# Patient Record
Sex: Female | Born: 1940 | Race: White | Hispanic: No | Marital: Married | Smoking: Never smoker
Health system: Southern US, Community
[De-identification: ages and names within clinical notes are randomized; demographics above are authoritative.]

## PROBLEM LIST (undated history)

## (undated) DIAGNOSIS — M5030 Other cervical disc degeneration, unspecified cervical region: Secondary | ICD-10-CM

## (undated) DIAGNOSIS — Z8639 Personal history of other endocrine, nutritional and metabolic disease: Secondary | ICD-10-CM

## (undated) DIAGNOSIS — IMO0001 Reserved for inherently not codable concepts without codable children: Secondary | ICD-10-CM

## (undated) DIAGNOSIS — M79669 Pain in unspecified lower leg: Secondary | ICD-10-CM

## (undated) DIAGNOSIS — E039 Hypothyroidism, unspecified: Secondary | ICD-10-CM

## (undated) DIAGNOSIS — N949 Unspecified condition associated with female genital organs and menstrual cycle: Secondary | ICD-10-CM

## (undated) DIAGNOSIS — J45909 Unspecified asthma, uncomplicated: Secondary | ICD-10-CM

## (undated) DIAGNOSIS — R112 Nausea with vomiting, unspecified: Secondary | ICD-10-CM

## (undated) DIAGNOSIS — Z8719 Personal history of other diseases of the digestive system: Secondary | ICD-10-CM

## (undated) DIAGNOSIS — D649 Anemia, unspecified: Secondary | ICD-10-CM

## (undated) DIAGNOSIS — M19032 Primary osteoarthritis, left wrist: Secondary | ICD-10-CM

## (undated) DIAGNOSIS — Z86010 Personal history of colonic polyps: Secondary | ICD-10-CM

## (undated) DIAGNOSIS — N159 Renal tubulo-interstitial disease, unspecified: Secondary | ICD-10-CM

## (undated) DIAGNOSIS — Z860101 Personal history of adenomatous and serrated colon polyps: Secondary | ICD-10-CM

## (undated) DIAGNOSIS — Z9889 Other specified postprocedural states: Secondary | ICD-10-CM

## (undated) DIAGNOSIS — E785 Hyperlipidemia, unspecified: Secondary | ICD-10-CM

## (undated) HISTORY — DX: Hyperlipidemia, unspecified: E78.5

## (undated) HISTORY — DX: Pain in unspecified lower leg: M79.669

## (undated) HISTORY — PX: TONSILLECTOMY AND ADENOIDECTOMY: SHX28

## (undated) HISTORY — PX: CHOLECYSTECTOMY: SHX55

## (undated) HISTORY — PX: BAND HEMORRHOIDECTOMY: SHX1213

## (undated) HISTORY — PX: OTHER SURGICAL HISTORY: SHX169

## (undated) HISTORY — PX: TUBAL LIGATION: SHX77

## (undated) HISTORY — PX: COLONOSCOPY W/ BIOPSIES AND POLYPECTOMY: SHX1376

## (undated) HISTORY — DX: Unspecified condition associated with female genital organs and menstrual cycle: N94.9

## (undated) HISTORY — DX: Primary osteoarthritis, left wrist: M19.032

## (undated) HISTORY — DX: Personal history of colonic polyps: Z86.010

## (undated) HISTORY — DX: Other cervical disc degeneration, unspecified cervical region: M50.30

## (undated) HISTORY — DX: Personal history of other endocrine, nutritional and metabolic disease: Z86.39

## (undated) HISTORY — DX: Hypothyroidism, unspecified: E03.9

## (undated) HISTORY — DX: Personal history of adenomatous and serrated colon polyps: Z86.0101

## (undated) HISTORY — DX: Personal history of other diseases of the digestive system: Z87.19

---

## 1970-10-29 HISTORY — PX: THYROIDECTOMY, PARTIAL: SHX18

## 1998-03-18 ENCOUNTER — Other Ambulatory Visit: Admission: RE | Admit: 1998-03-18 | Discharge: 1998-03-18 | Payer: Self-pay | Admitting: *Deleted

## 1999-03-23 ENCOUNTER — Other Ambulatory Visit: Admission: RE | Admit: 1999-03-23 | Discharge: 1999-03-23 | Payer: Self-pay | Admitting: *Deleted

## 2000-03-13 ENCOUNTER — Ambulatory Visit: Admission: RE | Admit: 2000-03-13 | Discharge: 2000-03-13 | Payer: Self-pay | Admitting: Family Medicine

## 2000-03-29 ENCOUNTER — Encounter: Payer: Self-pay | Admitting: Family Medicine

## 2000-03-29 ENCOUNTER — Ambulatory Visit (HOSPITAL_COMMUNITY): Admission: RE | Admit: 2000-03-29 | Discharge: 2000-03-29 | Payer: Self-pay | Admitting: Family Medicine

## 2000-04-16 ENCOUNTER — Other Ambulatory Visit: Admission: RE | Admit: 2000-04-16 | Discharge: 2000-04-16 | Payer: Self-pay | Admitting: *Deleted

## 2001-06-05 ENCOUNTER — Other Ambulatory Visit: Admission: RE | Admit: 2001-06-05 | Discharge: 2001-06-05 | Payer: Self-pay | Admitting: *Deleted

## 2001-10-29 HISTORY — PX: GANGLION CYST EXCISION: SHX1691

## 2002-04-22 ENCOUNTER — Ambulatory Visit (HOSPITAL_BASED_OUTPATIENT_CLINIC_OR_DEPARTMENT_OTHER): Admission: RE | Admit: 2002-04-22 | Discharge: 2002-04-22 | Payer: Self-pay | Admitting: Orthopedic Surgery

## 2002-10-07 ENCOUNTER — Other Ambulatory Visit: Admission: RE | Admit: 2002-10-07 | Discharge: 2002-10-07 | Payer: Self-pay | Admitting: Obstetrics and Gynecology

## 2003-10-11 ENCOUNTER — Other Ambulatory Visit: Admission: RE | Admit: 2003-10-11 | Discharge: 2003-10-11 | Payer: Self-pay | Admitting: *Deleted

## 2004-10-16 ENCOUNTER — Other Ambulatory Visit: Admission: RE | Admit: 2004-10-16 | Discharge: 2004-10-16 | Payer: Self-pay | Admitting: Obstetrics and Gynecology

## 2004-10-29 HISTORY — PX: WRIST SURGERY: SHX841

## 2005-04-25 ENCOUNTER — Ambulatory Visit (HOSPITAL_COMMUNITY): Admission: RE | Admit: 2005-04-25 | Discharge: 2005-04-25 | Payer: Self-pay | Admitting: Orthopedic Surgery

## 2005-04-25 ENCOUNTER — Ambulatory Visit (HOSPITAL_BASED_OUTPATIENT_CLINIC_OR_DEPARTMENT_OTHER): Admission: RE | Admit: 2005-04-25 | Discharge: 2005-04-25 | Payer: Self-pay | Admitting: Orthopedic Surgery

## 2005-10-25 ENCOUNTER — Other Ambulatory Visit: Admission: RE | Admit: 2005-10-25 | Discharge: 2005-10-25 | Payer: Self-pay | Admitting: Obstetrics and Gynecology

## 2008-02-27 ENCOUNTER — Ambulatory Visit: Payer: Self-pay | Admitting: Vascular Surgery

## 2009-10-29 DIAGNOSIS — Z8639 Personal history of other endocrine, nutritional and metabolic disease: Secondary | ICD-10-CM

## 2009-10-29 HISTORY — DX: Personal history of other endocrine, nutritional and metabolic disease: Z86.39

## 2010-11-19 ENCOUNTER — Encounter: Payer: Self-pay | Admitting: Family Medicine

## 2011-03-13 NOTE — Consult Note (Signed)
NEW PATIENT CONSULTATION   JI, FAIRBURN D  DOB:  1941-07-23                                       02/27/2008  ZOXWR#:[id number]   Margaret Holmes presents today for evaluation of pain in her left calf.  She is a 70 year old white female who has had an extensive evaluation  for this with no clear etiology.  She had seen the [facility]  specialist in January on two occasions.  I do have an ultrasound from  that visit showing normal left great saphenous and left small saphenous  vein with no evidence of reflux.  She also had no evidence of deep  venous incompetence.  She has also undergone an MRI, which I do not have  a copy of, which apparently showed some cellulitis in this area.  She  does report that her ankle will give out occasionally on the left side  as well.   PAST MEDICAL HISTORY:  Is negative for diabetes, high blood pressure,  cardiac disease.  She is status post partial thyroidectomy in 1972, she  also has had cholecystectomy in the 1980s.   SOCIAL HISTORY:  She is married with 3 children, works as a Midwife,  she does not smoke, or drink alcohol.   REVIEW OF SYSTEMS:  Positive for hiatal hernia, leg pain, headaches, her  weight is reported 124 pounds, height at 5 feet 4 inches tall.  She has  no drug allergies and her only medication is Synthroid.   PHYSICAL EXAM:  She does have 2+ dorsalis pedis pulses bilaterally, she  does not have any significant leg swelling, she does have multiple  spider vein telangiectasia but no evidence of varicose veins.  She does  have tenderness and some thickening of the skin in the area of concern  over her posterior calf from her ankle up onto her Achilles tendon area.  I imaged this area with handheld duplex and do not see any evidence of  any varicosities around this area.  I again discussed this with Mrs.  Holmes.  She does not have any evidence of arterial or venous pathology  to cause this pain, she is  frustrated at not knowing the cause of this.  She will continue to follow up with Dr. Foy Guadalajara and will see [place] again on  an as-needed basis.   Larina Earthly, M.D.  Electronically Signed   TFE/MEDQ  D:  02/27/2008  T:  03/01/2008  Job:  [id number]   cc:   Molly Maduro L. Foy Guadalajara, M.D.

## 2011-03-16 NOTE — Op Note (Signed)
[place]  Patient:    Margaret Holmes, Margaret Holmes Visit Number: [id number] MRN: [id number]          Service Type: DSU Location: [facility] Attending Physician:  Milly Jakob Dictated by:   Harvie Junior, M.D. Proc. Date: 04/22/02 Admit Date:  04/22/2002   CC:         Marinda Elk, M.D.   Operative Report  PREOPERATIVE DIAGNOSIS:  Ganglion cyst, right.  POSTOPERATIVE DIAGNOSES: 1. Ganglion cyst, right. 2. Pisotriquetral arthritic change.  PROCEDURES: 1. Excision of ganglion cyst. 2. Exploration of flexor carpi ulnaris tendon. 3. Debridement of spurs from the trapezium.  SURGEON: Harvie Junior, M.D.  ASSISTANT:  Currie Paris. Thedore Mins.  ANESTHESIA:  General.  BRIEF HISTORY:  She is a 70 year old female with a long history of having a painful ganglion cyst in the ulnar aspect of her wrist.  We attempted aspiration, attempted anti-inflammatory medicine, rest; all this failed, and so the patient continued to have pain over the ulnar aspect of the wrist with a large ganglion cyst.  Because of continued complaints of pain, she was ultimately taken to the operating room for excision of cyst.  DESCRIPTION OF PROCEDURE:  The patient was taken to the operating room and after adequate anesthesia was obtained with an IV regional, the patient was placed supine upon the operating table.  The right arm was then prepped and draped in the usual sterile fashion.  Once this was undertaken, the curved incision was made ,the subcutaneous tissues taken down to the level of the ganglion cyst.  The ganglion was identified and clearly dissected free and excised from the ulnar wrist capsule.  The FCU tendon was then explored and noted to be within normal limits.  Interestingly, as we got down to the pisiform bone, there was some pisotriquetral arthritic change.  There was a large spur which was noted on this ulnar aspect in the area of the cyst.  This was removed with  a rongeur and the area was smoothed down.  At this point a Glorious Peach was used to be placed into the pisotriquetral joint.  It appeared to be relatively smooth.  At this point felt that pisiform excision was not appropriate.  At this point attention was turned back to the FCU tendon, which was explored, noted to be within normal limits.  At this point the wrist was copiously irrigated and suctioned dry.  The wound was closed with a running subcuticular suture.  Benzoin and Steri-Strips were applied.  A sterile compressive dressing was applied.  The patient was taken to the recovery room, where she was noted to be in stable condition.  Estimated blood loss for the procedure was none. Dictated by:   Harvie Junior, M.D. Attending Physician:  Milly Jakob DD:  04/22/02 TD:  04/23/02 Job: [id number]

## 2011-03-16 NOTE — Op Note (Signed)
Margaret Holmes, Margaret Holmes               ACCOUNT NO.:  [id number]   MEDICAL RECORD NO.:  [id number]          PATIENT TYPE:  AMB   LOCATION:  DSC                          FACILITY:  [facility]   PHYSICIAN:  Artist Pais. Weingold, M.D.DATE OF BIRTH:  Jan 24, 1941   DATE OF PROCEDURE:  04/25/2005  DATE OF DISCHARGE:  04/25/2005                                 OPERATIVE REPORT   PREOPERATIVE DIAGNOSIS:  Left thumb carpal metacarpal arthritis.   POSTOPERATIVE DIAGNOSIS:  Left thumb carpal metacarpal arthritis.   PROCEDURE:  Left thumb carpal metacarpal suspension plasty with abductor  pollicis longus tendon transfer.   SURGEON:  Artist Pais. Mina Marble, M.D.   ASSISTANT:  Aura Fey. Bobbe Medico.   ANESTHESIA:  General.   TOURNIQUET TIME:  50 minutes.   COMPLICATIONS:  None.   DRAINS:  None.   DESCRIPTION OF PROCEDURE:  The patient was taken to the operating room and  after the induction of adequate general anesthesia, the left upper extremity  was prepped in the usual sterile fashion.  An Esmarch was used to  exsanguinate the limbs.  The tourniquet was inflated to 250 mmHg.  At this  point in time, a J-shaped incision was made over the thenar eminence of the  left thumb.  The skin was incised.  A volarly based flap was elevated.  The  thenar muscles were elevated off the CMC capsule.  The CMC capsule was  incised.  At this point in time, a trapeziectomy using a combination of  curets, osteotomes and rongeurs.  After this was done, a [facility] synovectomy was  performed.  This was followed by created of a transosseous canal at the base  of the thumb starting at the middle of the articular surface and exiting  dorsally 2 cm from the joint surface.  This was done using hand drills and  curets.  At this point in time, a second incision was made over the  musculotendinous junction of the first dorsal compartment.  The APL tendon  was identified at the musculotendinous junction, transected and drawn into  the  distal most wound.  It was then carried from dorsal volar out to thumb  metacarpal, wrapped around the flexor carpi radialis, and ______  suture  with 2-0 FiberWire and sutured back to itself at the base of the metacarpal,  thus creating  the suspension.  The wound was then thoroughly irrigated and thenar muscles  were repaired with 4-0 Vicryl, and both skin incisions were closed with 3-0  Prolene subcuticular stitches.  Steri-Strips, 4 x 4, Fluff and a compressive  dressing was applied.  The patient tolerated the procedure well and left the  operating room in stable fashion.       MAW/MEDQ  D:  04/27/2005  T:  04/27/2005  Job:  [id number]

## 2012-05-09 ENCOUNTER — Ambulatory Visit (INDEPENDENT_AMBULATORY_CARE_PROVIDER_SITE_OTHER): Payer: BC Managed Care – PPO | Admitting: Family Medicine

## 2012-05-09 ENCOUNTER — Encounter: Payer: Self-pay | Admitting: Family Medicine

## 2012-05-09 VITALS — BP 139/79 | HR 62 | Wt 123.0 lb

## 2012-05-09 DIAGNOSIS — E785 Hyperlipidemia, unspecified: Secondary | ICD-10-CM

## 2012-05-09 DIAGNOSIS — J329 Chronic sinusitis, unspecified: Secondary | ICD-10-CM

## 2012-05-09 DIAGNOSIS — Z0000 Encounter for general adult medical examination without abnormal findings: Secondary | ICD-10-CM

## 2012-05-09 DIAGNOSIS — Z86010 Personal history of colonic polyps: Secondary | ICD-10-CM

## 2012-05-09 DIAGNOSIS — Z23 Encounter for immunization: Secondary | ICD-10-CM

## 2012-05-09 DIAGNOSIS — E039 Hypothyroidism, unspecified: Secondary | ICD-10-CM

## 2012-05-09 LAB — TSH: TSH: 2.34 u[IU]/mL (ref 0.35–5.50)

## 2012-05-09 MED ORDER — ZOSTER VACCINE LIVE 19400 UNT/0.65ML ~~LOC~~ SOLR: 0.6500 mL | Freq: Once | SUBCUTANEOUS | Status: AC

## 2012-05-09 MED ORDER — FLUTICASONE PROPIONATE 50 MCG/ACT NA SUSP: NASAL | Status: DC

## 2012-05-09 MED ORDER — AMOXICILLIN 875 MG PO TABS: ORAL_TABLET | ORAL | Status: DC

## 2012-05-09 NOTE — Progress Notes (Addendum)
Office Note 05/11/2012  CC:  Chief Complaint  Patient presents with  . Establish Care    check TSH, sinus problems    HPI:  Margaret Holmes is a 71 y.o. White female who is here to establish care. Patient's most recent primary MD: Brooks Sailors in [place]. Old records were not reviewed prior to or during today's visit.  Pt presents complaining of respiratory symptoms for months days.  Primary symptoms are:  Nasal congestion, sometimes runny nose, facial/perinasal pressure, PND, some HA lately.  Worst symptoms seems to be the sinus pressure.  Lately the symptoms seem to be staying the same. Pertinent negatives: No fevers, no wheezing, and no SOB.  No pain in face or teeth.  No significant HA.  ST mild at most.   Symptoms made worse by the heat, night-time.  Symptoms improved by nothing. Smoker? no Recent sick contact? no Muscle or joint aches? no  Additional ROS: no n/v/d or abdominal pain.  No rash.  No neck stiffness.   No fatigue or appetite loss.  Past Medical History  Diagnosis Date  . Hyperlipidemia     per pt report 05/09/12--awaiting old records  . Hypothyroidism   . History of adenomatous polyp of colon 2004    due for repeat    Past Surgical History  Procedure Date  . Cholecystectomy 1980s  . Thyroidectomy, partial   . Ganglion cyst excision 2003    Right wrist, with excision of some triquetrum spurs  . Wrist surgery 2006    For left thumb carpo-metacarpal arthritis  . Tonsillectomy and adenoidectomy age 28    Family History  Problem Relation Age of Onset  . Cancer Mother     ovarian and throat cancer  . Diabetes Mother   . Alcohol abuse Father     alcoholism.  Died age 71  . Cancer Maternal Grandmother     breast cancer    History   Social History  . Marital Status: Married    Spouse Name: N/A    Number of Children: N/A  . Years of Education: N/A   Occupational History  . Not on file.   Social History Main Topics  . Smoking status: Never  Smoker   . Smokeless tobacco: Never Used  . Alcohol Use: No  . Drug Use: No  . Sexually Active: Not on file   Other Topics Concern  . Not on file   Social History Narrative   Married, 3 children, 6 grandchildren, 1 GGc.Orig from this region.  Drives a school bus (X 31 yrs).No T/A/Ds.Caffeine: one cup coffe a day, 3 glasses of sweet tea per day.No formal exercise.  She is active (NOT sedentary).    MEDS: Synthroid 75mg  qd, MVI qd, tylenol 500mg  prn  No Known Allergies  ROS Review of Systems  Constitutional: Negative for fever and fatigue.  HENT: Negative for congestion and sore throat.   Eyes: Negative for visual disturbance.  Respiratory: Negative for cough.   Cardiovascular: Negative for chest pain.  Gastrointestinal: Negative for nausea and abdominal pain.  Genitourinary: Negative for dysuria.  Musculoskeletal: Negative for back pain and joint swelling.  Skin: Negative for rash.  Neurological: Negative for weakness and headaches.  Hematological: Negative for adenopathy.  Psychiatric/Behavioral: Negative for dysphoric mood.    PE; Blood pressure 139/79, pulse 62, weight 123 lb (55.792 kg), SpO2 98.00%. Gen: Alert, well appearing.  Patient is oriented to person, place, time, and situation. ENT: Ears: EACs clear, normal epithelium.  TMs  with good light reflex and landmarks bilaterally.  Eyes: no injection, icteris, swelling, or exudate.  EOMI, PERRLA. Nose: no drainage but mild turbinate edema/swelling. Minimal mucosal injection, no focal lesion.  Mouth: lips without lesion/swelling.  Oral mucosa pink and moist.  Dentition intact and without obvious caries or gingival swelling.  Oropharynx without erythema, exudate, or swelling.   Pertinent labs:  none  ASSESSMENT AND PLAN:   New Pt: obtain old records.  History of adenomatous polyp of colon She is overdue for repeat colonoscopy. We've contacted Dr. Jennye Boroughs office and they will get in touch with her to set up repeat  colonoscopy.  Rhinosinusitis She looks pretty good today but given the chronicity of her symptoms we do need to get her on some allergy meds. We'll start with flonase every morning.  I did rx amoxicillin 875mg  bid x 10d.  Preventative health care Zostavax rx given today to take to pharmacy and get administered there.    Return in about 6 months (around 11/09/2012) for f/u hypothyroidism.

## 2012-05-11 ENCOUNTER — Encounter: Payer: Self-pay | Admitting: Family Medicine

## 2012-05-11 DIAGNOSIS — E039 Hypothyroidism, unspecified: Secondary | ICD-10-CM | POA: Insufficient documentation

## 2012-05-11 NOTE — Assessment & Plan Note (Signed)
Zostavax rx given today to take to pharmacy and get administered there.

## 2012-05-11 NOTE — Assessment & Plan Note (Signed)
She is overdue for repeat colonoscopy. We've contacted Dr. Jennye Boroughs office and they will get in touch with her to set up repeat colonoscopy.

## 2012-05-11 NOTE — Assessment & Plan Note (Addendum)
She looks pretty good today but given the chronicity of her symptoms we do need to get her on some allergy meds. We'll start with flonase every morning.  I did rx amoxicillin 875mg  bid x 10d.

## 2012-05-14 ENCOUNTER — Other Ambulatory Visit (INDEPENDENT_AMBULATORY_CARE_PROVIDER_SITE_OTHER): Payer: BC Managed Care – PPO

## 2012-05-14 DIAGNOSIS — E785 Hyperlipidemia, unspecified: Secondary | ICD-10-CM

## 2012-05-14 LAB — COMPREHENSIVE METABOLIC PANEL
AST: 27 U/L (ref 0–37)
Albumin: 4.2 g/dL (ref 3.5–5.2)
BUN: 15 mg/dL (ref 6–23)
Calcium: 9.5 mg/dL (ref 8.4–10.5)
Chloride: 102 mEq/L (ref 96–112)
Creatinine, Ser: 0.8 mg/dL (ref 0.4–1.2)
GFR: 79.1 mL/min (ref >60.00)
Glucose, Bld: 100 mg/dL — ABNORMAL HIGH (ref 70–99)
Potassium: 4.5 mEq/L (ref 3.5–5.1)

## 2012-05-14 LAB — LIPID PANEL
Cholesterol: 219 mg/dL — ABNORMAL HIGH (ref 0–200)
VLDL: 18.6 mg/dL (ref 0.0–40.0)

## 2012-05-14 LAB — LDL CHOLESTEROL, DIRECT: Direct LDL: 143.5 mg/dL

## 2012-05-28 ENCOUNTER — Encounter: Payer: Self-pay | Admitting: Family Medicine

## 2012-05-29 ENCOUNTER — Other Ambulatory Visit: Payer: BC Managed Care – PPO

## 2012-05-29 DIAGNOSIS — Z01419 Encounter for gynecological examination (general) (routine) without abnormal findings: Secondary | ICD-10-CM

## 2012-05-30 ENCOUNTER — Telehealth: Payer: Self-pay | Admitting: Family Medicine

## 2012-05-30 LAB — VITAMIN D 25 HYDROXY (VIT D DEFICIENCY, FRACTURES): Vit D, 25-Hydroxy: 33 ng/mL (ref 30–89)

## 2012-05-30 NOTE — Telephone Encounter (Signed)
Pt notified and is agreeable with plan. 

## 2012-05-30 NOTE — Telephone Encounter (Signed)
I got a faxed result for a Vit D level ordered by Julio Sicks (?).  Please notify the pt that her Vit D level is in the low normal range (33, and lower limit of normal is 30).  I recommend she get Vit D OTC pills that are 1000 Units each, take one daily and try to get more daily sunlight.--thx

## 2012-06-05 ENCOUNTER — Other Ambulatory Visit: Payer: Self-pay | Admitting: *Deleted

## 2012-06-05 MED ORDER — LEVOTHYROXINE SODIUM 75 MCG PO TABS: 75.0000 ug | ORAL_TABLET | Freq: Every day | ORAL | Status: DC

## 2012-06-05 NOTE — Telephone Encounter (Signed)
Refill request for synthroid Last filled-by previous physician Last labs normal and continue dose. RX sent.

## 2012-07-03 ENCOUNTER — Encounter: Payer: Self-pay | Admitting: Family Medicine

## 2012-10-27 ENCOUNTER — Telehealth: Payer: Self-pay | Admitting: Family Medicine

## 2012-10-30 NOTE — Telephone Encounter (Signed)
Advised pt she does not have to be fasting and we could check labs at her visit.  She is agreeable.

## 2012-10-31 ENCOUNTER — Ambulatory Visit (INDEPENDENT_AMBULATORY_CARE_PROVIDER_SITE_OTHER): Payer: BC Managed Care – PPO | Admitting: Family Medicine

## 2012-10-31 ENCOUNTER — Encounter: Payer: Self-pay | Admitting: Family Medicine

## 2012-10-31 VITALS — BP 133/69 | HR 69 | Wt 119.0 lb

## 2012-10-31 DIAGNOSIS — M6740 Ganglion, unspecified site: Secondary | ICD-10-CM

## 2012-10-31 DIAGNOSIS — M67439 Ganglion, unspecified wrist: Secondary | ICD-10-CM

## 2012-10-31 DIAGNOSIS — M62838 Other muscle spasm: Secondary | ICD-10-CM

## 2012-10-31 DIAGNOSIS — Z8639 Personal history of other endocrine, nutritional and metabolic disease: Secondary | ICD-10-CM

## 2012-10-31 DIAGNOSIS — IMO0001 Reserved for inherently not codable concepts without codable children: Secondary | ICD-10-CM

## 2012-10-31 DIAGNOSIS — M791 Myalgia, unspecified site: Secondary | ICD-10-CM

## 2012-10-31 DIAGNOSIS — E039 Hypothyroidism, unspecified: Secondary | ICD-10-CM

## 2012-10-31 LAB — BASIC METABOLIC PANEL
Chloride: 105 mEq/L (ref 96–112)
GFR: 80.19 mL/min (ref >60.00)
Potassium: 3.7 mEq/L (ref 3.5–5.1)
Sodium: 140 mEq/L (ref 135–145)

## 2012-10-31 LAB — MAGNESIUM: Magnesium: 2 mg/dL (ref 1.5–2.5)

## 2012-10-31 LAB — PHOSPHORUS: Phosphorus: 2.9 mg/dL (ref 2.3–4.6)

## 2012-10-31 LAB — TSH: TSH: 0.65 u[IU]/mL (ref 0.35–5.50)

## 2012-10-31 NOTE — Patient Instructions (Addendum)
Apply gold bond moisturizing cream at least twice daily. Apply Aquafor (over the counter ointment) at least twice per day.

## 2012-10-31 NOTE — Progress Notes (Signed)
OFFICE NOTE  10/31/2012  CC:  Chief Complaint  Patient presents with  . Follow-up    Hypothyroid; also having cramps in hands, legs, feet; lump on right wrist     HPI: Patient is a 72 y.o. Caucasian female who is here for 5mo f/u hypothyroidism.   Reports compliance with thyroid med daily. Noted a firm bump come up on right wrist about a year ago.  It bothers her intermittently, sometimes enough to inhibit her wrist bending due to the pain.  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 Describes daily cramping/spasming in muscles of hands and lower legs.  Asks for testing of potassium and vitamin D.  She recalls having more of these cramps when she was dx'd with vit D def in the past--? Coincidence.  Pertinent PMH:  Past Medical History  Diagnosis Date  . Hyperlipidemia     per pt report 05/09/12--awaiting old records  . Hypothyroidism   . History of adenomatous polyp of colon 2003, 2006, 2013    Repeat 2013 showed tubular adenoma x 3, with no high grade dysplasia.  3 year recall.  . H/O angular cheilitis     Zinc normal, vit B12 low normal (2012)  . H/O vitamin D deficiency 2011    Came back to normal range with replacement therapydd  . DDD (degenerative disc disease), cervical   . Mechanical back pain     MEDS:  Outpatient Prescriptions Prior to Visit  Medication Sig Dispense Refill  . Acetaminophen 500 MG TBDP Take 1 tablet by mouth as needed.      Marland Kitchen levothyroxine (SYNTHROID, LEVOTHROID) 75 MCG tablet Take 1 tablet (75 mcg total) by mouth daily.  90 tablet  1  . [DISCONTINUED] amoxicillin (AMOXIL) 875 MG tablet 1 tab po bid x 10d  20 tablet  0  . [DISCONTINUED] fluticasone (FLONASE) 50 MCG/ACT nasal spray 2 sprays in each nostril every morning  16 g  6  . [DISCONTINUED] Multiple Vitamin (MULTIVITAMIN) tablet Take 1 tablet by mouth daily.       Last reviewed on 10/31/2012  2:42 PM by Jeoffrey Massed, MD  PE: Blood pressure 133/69, pulse 69, weight 119 lb (53.978 kg). Gen: Alert,  well appearing.  Patient is oriented to person, place, time, and situation. Neck: supple/nontender.  No LAD, mass, or TM.  Carotid pulses 2+ bilaterally, without bruits. CV: RRR, no m/r/g.   LUNGS: CTA bilat, nonlabored resps, good aeration in all lung fields. Right wrist with firm but barely compressible nodule on volar aspect, lateral portion--approx 2 cm diameter.  Nontender. LE's: no edema, but extensive spider veins and varicose veins on both LL's below the knees.   IMPRESSION AND PLAN:  Hypothyroidism Check TSH today, adjust synthroid as appropriate.  Ganglion cyst of wrist Right.  Moderately symptomatic. Offered referral to gen surg for consideration of excision but she wants to wait on this for now.  Muscle spasms of lower extremity Also both hands.  Mostly nocturnal. Reassured pt that these are likely benign. Will check BMET with Mag and Phos.  H/O vitamin D deficiency This was treated in the past and documented to have come back in normal range.  However, she is requesting f/u testing of this. At next f/u, will recommend pt get bone densitometry testing to screen for osteoporosis.   An After Visit Summary was printed and given to the patient.  FOLLOW UP: 5mo

## 2012-11-01 LAB — VITAMIN D 25 HYDROXY (VIT D DEFICIENCY, FRACTURES): Vit D, 25-Hydroxy: 36 ng/mL (ref 30–89)

## 2012-11-03 DIAGNOSIS — M62838 Other muscle spasm: Secondary | ICD-10-CM | POA: Insufficient documentation

## 2012-11-03 DIAGNOSIS — Z8639 Personal history of other endocrine, nutritional and metabolic disease: Secondary | ICD-10-CM | POA: Insufficient documentation

## 2012-11-03 DIAGNOSIS — M67439 Ganglion, unspecified wrist: Secondary | ICD-10-CM | POA: Insufficient documentation

## 2012-11-03 NOTE — Assessment & Plan Note (Signed)
Check TSH today, adjust synthroid as appropriate.

## 2012-11-03 NOTE — Assessment & Plan Note (Addendum)
This was treated in the past and documented to have come back in normal range.  However, she is requesting f/u testing of this. At next f/u, will recommend pt get bone densitometry testing to screen for osteoporosis.

## 2012-11-03 NOTE — Assessment & Plan Note (Signed)
Right.  Moderately symptomatic. Offered referral to gen surg for consideration of excision but she wants to wait on this for now.

## 2012-11-03 NOTE — Assessment & Plan Note (Signed)
Also both hands.  Mostly nocturnal. Reassured pt that these are likely benign. Will check BMET with Mag and Phos.

## 2012-12-13 ENCOUNTER — Other Ambulatory Visit: Payer: Self-pay | Admitting: Family Medicine

## 2012-12-15 NOTE — Telephone Encounter (Signed)
eScribe request for refill on LEVOTHYROXINE Last filled - 06/05/12, #90 X 1 Last seen on - 10/31/12, TSH also Follow up - 6 MONTHS RX sent per protocol

## 2013-04-30 ENCOUNTER — Encounter: Payer: Self-pay | Admitting: Family Medicine

## 2013-04-30 ENCOUNTER — Ambulatory Visit (INDEPENDENT_AMBULATORY_CARE_PROVIDER_SITE_OTHER): Payer: BC Managed Care – PPO | Admitting: Family Medicine

## 2013-04-30 VITALS — BP 125/74 | HR 72 | Temp 98.2°F | Ht 62.0 in | Wt 116.0 lb

## 2013-04-30 DIAGNOSIS — M6740 Ganglion, unspecified site: Secondary | ICD-10-CM

## 2013-04-30 DIAGNOSIS — E039 Hypothyroidism, unspecified: Secondary | ICD-10-CM

## 2013-04-30 DIAGNOSIS — M62838 Other muscle spasm: Secondary | ICD-10-CM

## 2013-04-30 DIAGNOSIS — M67431 Ganglion, right wrist: Secondary | ICD-10-CM

## 2013-04-30 NOTE — Progress Notes (Signed)
OFFICE NOTE  04/30/2013  CC:  Chief Complaint  Patient presents with  . Follow-up    6 month     HPI: Patient is a 72 y.o. Caucasian female who is here for 6 mo f/u hypothyroidism. Takes thyroid med daily.  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 No new questions.   She will be going to her GYN in August for exam/mammogram.  She reports that a bone density test through their office in the past was normal.  The plan is to repeat this in the next 2-3 years-GYN to order per pt. She still has the zostavax rx I gave her last visit and plans on going to pharmacy to get this vaccine.  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  Pertinent PMH:  Past Medical History  Diagnosis Date  . Hyperlipidemia     per pt report 05/09/12--awaiting old records  . Hypothyroidism   . History of adenomatous polyp of colon 2003, 2006, 2013    Repeat 2013 showed tubular adenoma x 3, with no high grade dysplasia.  3 year recall.  . H/O angular cheilitis     Zinc normal, vit B12 low normal (2012)  . H/O vitamin D deficiency 2011    Came back to normal range with replacement therapydd  . DDD (degenerative disc disease), cervical   . Mechanical back pain    Past Surgical History  Procedure Laterality Date  . Cholecystectomy  1980s  . Thyroidectomy, partial  1972  . Ganglion cyst excision  2003    Right wrist, with excision of some triquetrum spurs  . Wrist surgery  2006    For left thumb carpo-metacarpal arthritis  . Tonsillectomy and adenoidectomy  age 45  . Gastric banding port revision    . Band hemorrhoidectomy    . Colonoscopy w/ biopsies and polypectomy  06/02/12   Past family and social history reviewed and there are no changes since the patient's last office visit with me.  MEDS:  Outpatient Prescriptions Prior to Visit  Medication Sig  Dispense Refill  . Acetaminophen 500 MG TBDP Take 1 tablet by mouth as needed.      . cholecalciferol (VITAMIN D) 1000 UNITS tablet Take 1,000 Units by mouth daily.      Marland Kitchen levothyroxine (SYNTHROID, LEVOTHROID) 75 MCG tablet TAKE 1 TABLET DAILY  90 tablet  1   No facility-administered medications prior to visit.    PE: Blood pressure 125/74, pulse 72, temperature 98.2 F (36.8 C), temperature source Oral, height 5\' 2"  (1.575 m), weight 116 lb (52.617 kg), SpO2 95.00%. Gen: Alert, well appearing.  Patient is oriented to person, place, time, and situation. CV: RRR, no m/r/g.   LUNGS: CTA bilat, nonlabored resps, good aeration in all lung fields. EXT: no clubbing, cyanosis, or edema.  No calf tenderness.  Popliteal fossae w/out tenderness or mass.  Knees w/out swelling. Right wrist volar surface with approx 1-2 cm moveable oval subQ nodule that moves with the flexor tendons of wrist/fingers.    LAB: none today  IMPRESSION AND PLAN:  Hypothyroidism Lab Results  Component Value Date   TSH 0.65 10/31/2012   Continue current levothyroxine dosing. No labs today. Will recheck TSH at next f/u visit in 40mo (CPE).  Ganglion cyst of wrist Unchanged. Hurts her sometimes but she declines referral for surgical consult  at this time.  Muscle spasms of lower extremity Patient consistently describes calf muscle cramps. Reassured pt of benign nature of this condition, no specific meds to treat this. Encouraged good hydration.   An After Visit Summary was printed and given to the patient.  FOLLOW UP: 6 mo for fasting CPE

## 2013-05-13 ENCOUNTER — Telehealth: Payer: Self-pay | Admitting: Emergency Medicine

## 2013-05-13 NOTE — Telephone Encounter (Signed)
Patient states she received a letter from her insurance company to have a PAD screening done. She is concerned that some of her current leg pain could be related to some of the symptoms listed on the letter. Please advise patient I did offer to make appointment but she would like to speak with a doctor or nurse.

## 2013-05-14 ENCOUNTER — Other Ambulatory Visit: Payer: Self-pay | Admitting: Family Medicine

## 2013-05-14 DIAGNOSIS — M79669 Pain in unspecified lower leg: Secondary | ICD-10-CM

## 2013-05-14 NOTE — Assessment & Plan Note (Signed)
Patient consistently describes calf muscle cramps. Reassured pt of benign nature of this condition, no specific meds to treat this. Encouraged good hydration.

## 2013-05-14 NOTE — Assessment & Plan Note (Signed)
Unchanged. Hurts her sometimes but she declines referral for surgical consult at this time.

## 2013-05-14 NOTE — Telephone Encounter (Signed)
Please advise 

## 2013-05-14 NOTE — Telephone Encounter (Signed)
OK.  Tell pt I'll put in an order for a test called an ankle/brachial index or "ABI".  If this is normal, no further testing needed.  If it is abnormal then we'll proceed with more detailed testing to look for peripheral arterial disease (PAD). Let me know and I'll enter order.-thx

## 2013-05-14 NOTE — Telephone Encounter (Signed)
OK, ordered

## 2013-05-14 NOTE — Telephone Encounter (Signed)
Patient agreed to have ABI testing done.

## 2013-05-14 NOTE — Assessment & Plan Note (Signed)
Lab Results  Component Value Date   TSH 0.65 10/31/2012   Continue current levothyroxine dosing. No labs today. Will recheck TSH at next f/u visit in 64mo (CPE).

## 2013-05-18 ENCOUNTER — Encounter (INDEPENDENT_AMBULATORY_CARE_PROVIDER_SITE_OTHER): Payer: BC Managed Care – PPO

## 2013-05-18 ENCOUNTER — Encounter: Payer: Self-pay | Admitting: Family Medicine

## 2013-05-18 DIAGNOSIS — R252 Cramp and spasm: Secondary | ICD-10-CM

## 2013-05-18 DIAGNOSIS — I739 Peripheral vascular disease, unspecified: Secondary | ICD-10-CM

## 2013-05-20 ENCOUNTER — Encounter: Payer: Self-pay | Admitting: Family Medicine

## 2013-06-30 ENCOUNTER — Telehealth: Payer: Self-pay | Admitting: Family Medicine

## 2013-06-30 MED ORDER — LEVOTHYROXINE SODIUM 75 MCG PO TABS: ORAL_TABLET | ORAL | Status: DC

## 2013-06-30 NOTE — Telephone Encounter (Signed)
Sent medication to pharmacy.   

## 2013-07-20 ENCOUNTER — Ambulatory Visit (INDEPENDENT_AMBULATORY_CARE_PROVIDER_SITE_OTHER): Payer: BC Managed Care – PPO | Admitting: Family Medicine

## 2013-07-20 ENCOUNTER — Encounter: Payer: Self-pay | Admitting: Family Medicine

## 2013-07-20 VITALS — BP 138/88 | HR 76 | Temp 98.1°F | Resp 18 | Ht 62.0 in | Wt 114.0 lb

## 2013-07-20 DIAGNOSIS — R300 Dysuria: Secondary | ICD-10-CM

## 2013-07-20 DIAGNOSIS — M549 Dorsalgia, unspecified: Secondary | ICD-10-CM

## 2013-07-20 LAB — POCT URINALYSIS DIPSTICK
Glucose, UA: NEGATIVE
Leukocytes, UA: NEGATIVE
Nitrite, UA: NEGATIVE
Protein, UA: NEGATIVE
Spec Grav, UA: 1.01
Urobilinogen, UA: 0.2
pH, UA: 5.5

## 2013-07-20 MED ORDER — PROMETHAZINE HCL 12.5 MG PO TABS: ORAL_TABLET | ORAL | Status: DC

## 2013-07-20 MED ORDER — CEFDINIR 300 MG PO CAPS: 300.0000 mg | ORAL_CAPSULE | Freq: Two times a day (BID) | ORAL | Status: DC

## 2013-07-20 MED ORDER — FLUTICASONE PROPIONATE 50 MCG/ACT NA SUSP: NASAL | Status: DC

## 2013-07-20 NOTE — Progress Notes (Signed)
OFFICE NOTE  07/20/2013  CC:  Chief Complaint  Patient presents with  . Headache  . Back Pain  . Nausea     HPI: Patient is a 72 y.o. Caucasian female who is here for pt's suspicion of sinus infection. Some runny nose which is chronic, with new left sided face and ear pain and generalized HA over the last few days. 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  Has lots of jolting/bouncing when driving bus and this does give her some pelvic/low back musculoskeletal complaints.  Has on/off nausea and right mid/lower abd pains in the early summer, just returned last couple of days. Some burning with urination lately, right back/flank pain, nausea more today.   Pertinent PMH:  Past Medical History  Diagnosis Date  . Hyperlipidemia     per pt report 05/09/12--awaiting old records  . Hypothyroidism   . History of adenomatous polyp of colon 2003, 2006, 2013    Repeat 2013 showed tubular adenoma x 3, with no high grade dysplasia.  3 year recall.  . H/O angular cheilitis     Zinc normal, vit B12 low normal (2012)  . H/O vitamin D deficiency 2011    Came back to normal range with replacement therapydd  . DDD (degenerative disc disease), cervical   . Mechanical back pain   . Lower leg pain     Bilateral, crampy--ABIs/dopplers NORMAL 04/2013.   Past Surgical History  Procedure Laterality Date  . Cholecystectomy  1980s  . Thyroidectomy, partial  1972  . Ganglion cyst excision  2003    Right wrist, with excision of some triquetrum spurs  . Wrist surgery  2006    For left thumb carpo-metacarpal arthritis  . Tonsillectomy and adenoidectomy  age 47  . Gastric banding port revision    . Band hemorrhoidectomy    . Colonoscopy w/ biopsies and polypectomy  06/02/12   History   Social History Narrative   Married, 3 children, 6 grandchildren, 1 GGc.   Orig from this region.  Drives a school bus (X 31 yrs).   No T/A/Ds.   Caffeine: one cup coffe a day, 3 glasses  of sweet tea per day.   No formal exercise.  She is active (NOT sedentary).             MEDS:  Outpatient Prescriptions Prior to Visit  Medication Sig Dispense Refill  . Acetaminophen 500 MG TBDP Take 1 tablet by mouth as needed.      . cholecalciferol (VITAMIN D) 1000 UNITS tablet Take 1,000 Units by mouth daily.      Marland Kitchen levothyroxine (SYNTHROID, LEVOTHROID) 75 MCG tablet TAKE 1 TABLET DAILY  90 tablet  1   No facility-administered medications prior to visit.    PE: Blood pressure 138/88, pulse 76, temperature 98.1 F (36.7 C), temperature source Oral, resp. rate 18, height 5\' 2"  (1.575 m), weight 114 lb (51.71 kg), SpO2 98.00%. Gen: alert, appears weak/tired but in NAD. VS: noted--normal. HEENT: eyes without injection, drainage, or swelling.  Ears: EACs clear, TMs with normal light reflex and landmarks.  Nose: Clear rhinorrhea, with some dried, crusty exudate adherent to mildly injected mucosa.  No purulent d/c.  No paranasal sinus TTP.  No facial swelling.  Throat and mouth without focal lesion.  No pharyngial swelling, erythema, or exudate.   Neck: supple, no LAD.   LUNGS: CTA bilat, nonlabored resps.   CV: RRR, no m/r/g. ABD: diffuse TTP, BS normal.  Pt flinches with a  lot of the exam today so I was unable to really discern true guarding or rebound tenderness.  Right CVA region TTP, otherwise back is nontender. EXT: no c/c/e SKIN: no rash  LAB: CC UA today:  Trace intact blood.  Otherwise normal.  IMPRESSION AND PLAN:  Sinusitis symptoms, but also some nausea and a multitude of other intermittent sx's the last couple of days that I think are chronic/recurrent.  Her pain pattern is not consistent with ureterolithiasis.  I would have expected a worse UA if she had pyelo. Her trace blood could be related to cystitis--will send for c/s. Will start omnicef 300mg  bid x 10d, phenergan 12.5, 1-2 q6h prn, #20, no RF. Restart daily flonase, also saline nasal spray. F/u 7-10d in  office.  FOLLOW UP: 7-10 d

## 2013-07-21 ENCOUNTER — Telehealth: Payer: Self-pay | Admitting: Family Medicine

## 2013-07-21 LAB — URINE CULTURE: Colony Count: NO GROWTH

## 2013-07-21 NOTE — Telephone Encounter (Signed)
I spoke to patient and told her that she should contact her GI doctor to discuss these symptoms.  Patient states that she will but she would still like an excuse from work.  Please advise.

## 2013-07-21 NOTE — Telephone Encounter (Signed)
Patient feels she is "just not right" after having the colonoscopy. She is still having diarrhea. 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 Patient also wants to know if there are any tests that can be run in our office to figure out why she keeps having diarrhea. She does not want to return to the GI office. Please contact patient.

## 2013-07-21 NOTE — Telephone Encounter (Signed)
OK to do work note to excuse her for 07/22/13.  I agree that she needs to contact GI with her concerns.-thx

## 2013-07-21 NOTE — Telephone Encounter (Signed)
Patient work excuse printed.  Patient will have her husband pick it up today or tomorrow.

## 2013-07-22 ENCOUNTER — Encounter: Payer: Self-pay | Admitting: Family Medicine

## 2013-07-27 ENCOUNTER — Ambulatory Visit (INDEPENDENT_AMBULATORY_CARE_PROVIDER_SITE_OTHER): Payer: BC Managed Care – PPO | Admitting: Family Medicine

## 2013-07-27 ENCOUNTER — Encounter: Payer: Self-pay | Admitting: Family Medicine

## 2013-07-27 VITALS — BP 144/80 | HR 69 | Temp 98.4°F | Resp 18 | Ht 62.0 in | Wt 116.0 lb

## 2013-07-27 DIAGNOSIS — M545 Low back pain, unspecified: Secondary | ICD-10-CM | POA: Insufficient documentation

## 2013-07-27 DIAGNOSIS — R109 Unspecified abdominal pain: Secondary | ICD-10-CM | POA: Insufficient documentation

## 2013-07-27 DIAGNOSIS — M549 Dorsalgia, unspecified: Secondary | ICD-10-CM

## 2013-07-27 LAB — CBC WITH DIFFERENTIAL/PLATELET
Basophils Absolute: 0 10*3/uL (ref 0.0–0.1)
Basophils Relative: 0.5 % (ref 0.0–3.0)
Eosinophils Absolute: 0 10*3/uL (ref 0.0–0.7)
Eosinophils Relative: 0.5 % (ref 0.0–5.0)
Lymphocytes Relative: 20.7 % (ref 12.0–46.0)
MCV: 90 fl (ref 78.0–100.0)
Monocytes Absolute: 0.5 10*3/uL (ref 0.1–1.0)
Neutro Abs: 5.2 10*3/uL (ref 1.4–7.7)
Neutrophils Relative %: 71.2 % (ref 43.0–77.0)
Platelets: 225 10*3/uL (ref 150.0–400.0)
RBC: 4.15 Mil/uL (ref 3.87–5.11)
WBC: 7.3 10*3/uL (ref 4.5–10.5)

## 2013-07-27 LAB — COMPREHENSIVE METABOLIC PANEL
AST: 25 U/L (ref 0–37)
Albumin: 4.1 g/dL (ref 3.5–5.2)
Alkaline Phosphatase: 41 U/L (ref 39–117)
Calcium: 9 mg/dL (ref 8.4–10.5)
Chloride: 105 mEq/L (ref 96–112)
Glucose, Bld: 88 mg/dL (ref 70–99)
Potassium: 3.8 mEq/L (ref 3.5–5.1)
Sodium: 140 mEq/L (ref 135–145)
Total Protein: 6.7 g/dL (ref 6.0–8.3)

## 2013-07-27 LAB — LIPASE: Lipase: 25 U/L (ref 11.0–59.0)

## 2013-07-27 NOTE — Progress Notes (Signed)
OFFICE NOTE  07/27/2013  CC:  Chief Complaint  Patient presents with  . Follow-up     HPI: Patient is a 72 y.o. Caucasian female who is here for 1 wk f/u sinusitis--omnicef rx'd last week. Also had a multitude of other sx's that were flaring (back, side, abd pains). Says her sinus sx's are improved.  Says she has had constant abd pain, anorexia and periods of nausea w/out vomiting.  Worse with pressing on the upper abd, with activity, and with eating (although she says she essentially avoids eating at all lately).  Hurts all over abd but she holds mid epig and RUQ the most when talking about it.  Drinks clear liquids without problem.  No fevers.  No dysuria, hematuria, or urinary urgency. She ate a very rich dish of food a couple of nights ago and sometime after this she had a bout of fecal incontinence--" I didn't even know I was going".  No loss of bladder control.  No saddle anesthesia.  No diarrhea.  Pertinent PMH:  Past Medical History  Diagnosis Date  . Hyperlipidemia     per pt report 05/09/12--awaiting old records  . Hypothyroidism   . History of adenomatous polyp of colon 2003, 2006, 2013    Repeat 2013 showed tubular adenoma x 3, with no high grade dysplasia.  3 year recall.  . H/O angular cheilitis     Zinc normal, vit B12 low normal (2012)  . H/O vitamin D deficiency 2011    Came back to normal range with replacement therapydd  . DDD (degenerative disc disease), cervical   . Mechanical back pain   . Lower leg pain     Bilateral, crampy--ABIs/dopplers NORMAL 04/2013.   Past Surgical History  Procedure Laterality Date  . Cholecystectomy  1980s  . Thyroidectomy, partial  1972  . Ganglion cyst excision  2003    Right wrist, with excision of some triquetrum spurs  . Wrist surgery  2006    For left thumb carpo-metacarpal arthritis  . Tonsillectomy and adenoidectomy  age 75  . Gastric banding port revision    . Band hemorrhoidectomy    . Colonoscopy w/ biopsies and  polypectomy  06/02/12    MEDS:  Outpatient Prescriptions Prior to Visit  Medication Sig Dispense Refill  . Acetaminophen 500 MG TBDP Take 1 tablet by mouth as needed.      . cefdinir (OMNICEF) 300 MG capsule Take 1 capsule (300 mg total) by mouth 2 (two) times daily.  20 capsule  0  . cholecalciferol (VITAMIN D) 1000 UNITS tablet Take 1,000 Units by mouth daily.      . fluticasone (FLONASE) 50 MCG/ACT nasal spray 1-2 sprays in each nostril qAM  16 g  6  . levothyroxine (SYNTHROID, LEVOTHROID) 75 MCG tablet TAKE 1 TABLET DAILY  90 tablet  1  . promethazine (PHENERGAN) 12.5 MG tablet 1-2 tabs po 6h prn nausea  20 tablet  0   No facility-administered medications prior to visit.    PE: Blood pressure 144/80, pulse 69, temperature 98.4 F (36.9 C), temperature source Temporal, resp. rate 18, height 5\' 2"  (1.575 m), weight 116 lb (52.617 kg), SpO2 96.00%. Gen: alert, tired appearing but NAD, nontoxic-appearing.  Oriented x 4. Oropharynx: pink and moist. CV: RRR, no m/r/g.   LUNGS: CTA bilat, nonlabored resps, good aeration in all lung fields. ABD: soft, nondistended.  Diffusely TTP except for RLQ and RUQ.  She jumps when I touch upper abd in middle and left  side and in periumbillical area and LLQ.  No mass.  No rebound tenderness.  No bruit.  BS normal. EXT: no clubbing, cyanosis, or edema.    IMPRESSION AND PLAN:  1) Sinusitis: resolving.  2) Abdominal pain, subacute: will check CBC w/diff, CMET, lipase, and CT abd pelvis with contrast.   She declines any nausea med at this time. She has appt with her GI MD, Dr. Kinnie Scales, in 1 week.  3) Mid and lower back pains--most consistent with musculoskeletal pain.  Given her central tenderness and description of the pan as radiating around flanks at times, will check plain films of T and L spine to check for compression fracture.  An After Visit Summary was printed and given to the patient.  FOLLOW UP: we'll determine f/u based on labs/imaging  results.

## 2013-07-29 ENCOUNTER — Ambulatory Visit (HOSPITAL_BASED_OUTPATIENT_CLINIC_OR_DEPARTMENT_OTHER)
Admission: RE | Admit: 2013-07-29 | Discharge: 2013-07-29 | Disposition: A | Discharge status: Home / Self Care | Payer: BC Managed Care – PPO | Source: Ambulatory Visit | Attending: Family Medicine | Admitting: Family Medicine

## 2013-07-29 DIAGNOSIS — R1013 Epigastric pain: Secondary | ICD-10-CM | POA: Insufficient documentation

## 2013-07-29 DIAGNOSIS — R109 Unspecified abdominal pain: Secondary | ICD-10-CM

## 2013-07-29 DIAGNOSIS — K449 Diaphragmatic hernia without obstruction or gangrene: Secondary | ICD-10-CM | POA: Insufficient documentation

## 2013-07-29 DIAGNOSIS — M4120 Other idiopathic scoliosis, site unspecified: Secondary | ICD-10-CM | POA: Insufficient documentation

## 2013-07-29 DIAGNOSIS — N949 Unspecified condition associated with female genital organs and menstrual cycle: Secondary | ICD-10-CM

## 2013-07-29 DIAGNOSIS — K5900 Constipation, unspecified: Secondary | ICD-10-CM | POA: Insufficient documentation

## 2013-07-29 DIAGNOSIS — M549 Dorsalgia, unspecified: Secondary | ICD-10-CM | POA: Insufficient documentation

## 2013-07-29 DIAGNOSIS — Z9089 Acquired absence of other organs: Secondary | ICD-10-CM | POA: Insufficient documentation

## 2013-07-29 DIAGNOSIS — N83209 Unspecified ovarian cyst, unspecified side: Secondary | ICD-10-CM | POA: Insufficient documentation

## 2013-07-29 HISTORY — DX: Unspecified condition associated with female genital organs and menstrual cycle: N94.9

## 2013-07-29 MED ORDER — IOHEXOL 300 MG/ML  SOLN
100.0000 mL | Freq: Once | INTRAMUSCULAR | Status: AC | PRN
  Administered 2013-07-29: 100 mL via INTRAVENOUS

## 2013-07-31 ENCOUNTER — Other Ambulatory Visit: Payer: Self-pay | Admitting: Family Medicine

## 2013-07-31 DIAGNOSIS — N949 Unspecified condition associated with female genital organs and menstrual cycle: Secondary | ICD-10-CM

## 2013-08-07 ENCOUNTER — Ambulatory Visit (HOSPITAL_BASED_OUTPATIENT_CLINIC_OR_DEPARTMENT_OTHER)
Admission: RE | Admit: 2013-08-07 | Discharge: 2013-08-07 | Disposition: A | Discharge status: Home / Self Care | Payer: BC Managed Care – PPO | Source: Ambulatory Visit | Attending: Family Medicine | Admitting: Family Medicine

## 2013-08-07 DIAGNOSIS — R109 Unspecified abdominal pain: Secondary | ICD-10-CM | POA: Insufficient documentation

## 2013-08-07 DIAGNOSIS — N949 Unspecified condition associated with female genital organs and menstrual cycle: Secondary | ICD-10-CM

## 2013-08-10 ENCOUNTER — Encounter: Payer: Self-pay | Admitting: Family Medicine

## 2013-10-30 ENCOUNTER — Ambulatory Visit (INDEPENDENT_AMBULATORY_CARE_PROVIDER_SITE_OTHER): Payer: BC Managed Care – PPO | Admitting: Family Medicine

## 2013-10-30 ENCOUNTER — Encounter: Payer: Self-pay | Admitting: Family Medicine

## 2013-10-30 VITALS — BP 144/81 | HR 58 | Temp 98.6°F | Resp 16 | Ht 62.0 in | Wt 115.0 lb

## 2013-10-30 DIAGNOSIS — E039 Hypothyroidism, unspecified: Secondary | ICD-10-CM

## 2013-10-30 DIAGNOSIS — J019 Acute sinusitis, unspecified: Secondary | ICD-10-CM

## 2013-10-30 DIAGNOSIS — R040 Epistaxis: Secondary | ICD-10-CM

## 2013-10-30 LAB — TSH: TSH: 0.16 u[IU]/mL — AB (ref 0.35–5.50)

## 2013-10-30 MED ORDER — AMOXICILLIN-POT CLAVULANATE 875-125 MG PO TABS: 1.0000 | ORAL_TABLET | Freq: Two times a day (BID) | ORAL | Status: DC

## 2013-10-30 NOTE — Assessment & Plan Note (Signed)
Due for recheck of TSH TODAY.

## 2013-10-30 NOTE — Patient Instructions (Signed)
Buy saline nasal spray and use 3 times per day to moisturize and irrigate nasal passages.  Apply vaseline to the lining of your nose with a Q-tip.

## 2013-10-30 NOTE — Progress Notes (Signed)
Pre visit review using our clinic review tool, if applicable. No additional management support is needed unless otherwise documented below in the visit note. 

## 2013-10-30 NOTE — Progress Notes (Signed)
OFFICE NOTE  10/30/2013  CC:  Chief Complaint  Patient presents with  . Epistaxis  . Sinus Problem     HPI: Patient is a 73 y.o. Caucasian female who is here for nosebleeds, sinus problems. Onset of nasal congestion, nasal mucous w/PND, going on at least 6 wks per pt, got worse recently with frontal/peri-orbital HA, sore around perinasal sinuses.  Some coughing. No fevers.  Some nose bleeds lately.    Pertinent PMH:  Past Medical History  Diagnosis Date  . Hyperlipidemia     per pt report 05/09/12--awaiting old records  . Hypothyroidism   . History of adenomatous polyp of colon 2003, 2006, 2013    Repeat 2013 showed tubular adenoma x 3, with no high grade dysplasia.  3 year recall.  . H/O angular cheilitis     Zinc normal, vit B12 low normal (2012)  . H/O vitamin D deficiency 2011    Came back to normal range with replacement therapydd  . DDD (degenerative disc disease), cervical   . Mechanical back pain   . Lower leg pain     Bilateral, crampy--ABIs/dopplers NORMAL 04/2013.  . Adnexal cyst 07/2013    Left-3cm-noted on CT abd/pelv--f/u pelvic u/s did not show anything (L ovary not visualized, likely secondary to bowel gas)-repeat pelvic u/s after 07/2014.   Past surgical, social, and family history reviewed and no changes noted since last office visit.  MEDS:  Outpatient Prescriptions Prior to Visit  Medication Sig Dispense Refill  . Acetaminophen 500 MG TBDP Take 1 tablet by mouth as needed.      . cholecalciferol (VITAMIN D) 1000 UNITS tablet Take 1,000 Units by mouth daily.      . fluticasone (FLONASE) 50 MCG/ACT nasal spray 1-2 sprays in each nostril qAM  16 g  6  . levothyroxine (SYNTHROID, LEVOTHROID) 75 MCG tablet TAKE 1 TABLET DAILY  90 tablet  1  . promethazine (PHENERGAN) 12.5 MG tablet 1-2 tabs po 6h prn nausea  20 tablet  0  . cefdinir (OMNICEF) 300 MG capsule Take 1 capsule (300 mg total) by mouth 2 (two) times daily.  20 capsule  0   No  facility-administered medications prior to visit.  **Not taking cefdinir listed above (this was from 06/2013)  PE: Blood pressure 144/81, pulse 58, temperature 98.6 F (37 C), temperature source Temporal, resp. rate 16, height 5\' 2"  (1.575 m), weight 115 lb (52.164 kg), SpO2 96.00%. VS: noted--normal. Gen: alert, NAD, NONTOXIC APPEARING. HEENT: eyes without injection, drainage, or swelling.  Ears: EACs clear, TMs with normal light reflex and landmarks.  Nose: Clear rhinorrhea, with some dried, crusty exudate adherent to mildly injected mucosa.  No blood.  No purulent d/c.  No paranasal sinus TTP.  No facial swelling.  Throat and mouth without focal lesion.  No pharyngial swelling, erythema, or exudate.   Neck: supple, no LAD.   LUNGS: CTA bilat, nonlabored resps.   CV: RRR, no m/r/g. EXT: no c/c/e SKIN: no rash    IMPRESSION AND PLAN:  Sinusitis, acute With some epistaxis lately. Stop flonase for now. Apply saline nasal spray. Augmentin 875mg  bid x 10d.  Hypothyroidism Due for recheck of TSH TODAY.   An After Visit Summary was printed and given to the patient.  FOLLOW UP: prn

## 2013-10-30 NOTE — Assessment & Plan Note (Addendum)
With some epistaxis lately. Stop flonase for now. Apply saline nasal spray. Augmentin 875mg  bid x 10d.

## 2013-12-21 ENCOUNTER — Telehealth: Payer: Self-pay | Admitting: Family Medicine

## 2013-12-21 DIAGNOSIS — E039 Hypothyroidism, unspecified: Secondary | ICD-10-CM

## 2013-12-21 NOTE — Telephone Encounter (Signed)
Please advise 

## 2013-12-21 NOTE — Telephone Encounter (Signed)
LMOM to let pt know it is ok for her to come in for her labs only on Friday. Test ordered-future order. Asked pt to call Diane to set up appt.

## 2013-12-21 NOTE — Telephone Encounter (Signed)
Yes, schedule lab visit on Friday for TSH only. This is to recheck things after a dose adjustment we made about 6 wks ago.  No o/v needed.-thx

## 2013-12-21 NOTE — Telephone Encounter (Signed)
Patient is requesting to come in Friday to have her Thyroid levels checked, does she need an OV or just labs?

## 2013-12-25 ENCOUNTER — Other Ambulatory Visit (INDEPENDENT_AMBULATORY_CARE_PROVIDER_SITE_OTHER): Payer: BC Managed Care – PPO

## 2013-12-25 DIAGNOSIS — E039 Hypothyroidism, unspecified: Secondary | ICD-10-CM

## 2013-12-25 LAB — TSH: TSH: 2.4 u[IU]/mL (ref 0.35–5.50)

## 2014-01-21 ENCOUNTER — Ambulatory Visit (INDEPENDENT_AMBULATORY_CARE_PROVIDER_SITE_OTHER): Payer: BC Managed Care – PPO | Admitting: Family Medicine

## 2014-01-21 ENCOUNTER — Encounter: Payer: Self-pay | Admitting: Family Medicine

## 2014-01-21 VITALS — BP 137/79 | HR 60 | Temp 98.4°F | Resp 16 | Ht 62.0 in | Wt 116.0 lb

## 2014-01-21 DIAGNOSIS — R110 Nausea: Secondary | ICD-10-CM

## 2014-01-21 DIAGNOSIS — R109 Unspecified abdominal pain: Secondary | ICD-10-CM

## 2014-01-21 LAB — POCT URINALYSIS DIPSTICK
Bilirubin, UA: NEGATIVE
GLUCOSE UA: NEGATIVE
Ketones, UA: NEGATIVE
Nitrite, UA: NEGATIVE
Protein, UA: NEGATIVE
Spec Grav, UA: <=1.005
UROBILINOGEN UA: 0.2
pH, UA: 5.5

## 2014-01-21 MED ORDER — SULFAMETHOXAZOLE-TMP DS 800-160 MG PO TABS: 1.0000 | ORAL_TABLET | Freq: Two times a day (BID) | ORAL | Status: DC

## 2014-01-21 NOTE — Progress Notes (Signed)
Pre visit review using our clinic review tool, if applicable. No additional management support is needed unless otherwise documented below in the visit note. 

## 2014-01-21 NOTE — Progress Notes (Signed)
OFFICE NOTE  01/21/2014  CC:  Chief Complaint  Patient presents with  . Nausea    x this am  . skin irritation     HPI: Patient is a 73 y.o. Caucasian female who is here for redness in belly button noted this morning. Has chronic episodes of abd pains.  Saw Dr. Earlean Shawl and was treated medically and pt not pleased but doesn't have any details and I have no records. Pain is in lower abd, gets better with a good BM.   Since my last visit with her:  She describes having right kidney infection with possible ureteral abnormality; a ureteral stent was put in and is still in.  She says urologist has talked about "reconstructive surgery" --of ureter? Left kidney normal.  Pt says cystoscopy was ok.  Dr. Pia Mau in [place] is the urologist.  She finished a course of augmentin.  Describes urinary frequency and about 1 wk ago started having stinging in urethral region when urinating. No AZO taken.    Pertinent PMH:  Past medical, surgical, social, and family history reviewed and no changes are noted since last office visit.  MEDS: Not taking augmentin listed below Outpatient Prescriptions Prior to Visit  Medication Sig Dispense Refill  . Acetaminophen 500 MG TBDP Take 1 tablet by mouth as needed.      . cholecalciferol (VITAMIN D) 1000 UNITS tablet Take 1,000 Units by mouth daily.      . fluticasone (FLONASE) 50 MCG/ACT nasal spray 1-2 sprays in each nostril qAM  16 g  6  . levothyroxine (SYNTHROID, LEVOTHROID) 75 MCG tablet TAKE 1 TABLET DAILY  90 tablet  1  . promethazine (PHENERGAN) 12.5 MG tablet 1-2 tabs po 6h prn nausea  20 tablet  0  . amoxicillin-clavulanate (AUGMENTIN) 875-125 MG per tablet Take 1 tablet by mouth 2 (two) times daily.  20 tablet  0   No facility-administered medications prior to visit.    PE: Blood pressure 137/79, pulse 60, temperature 98.4 F (36.9 C), temperature source Temporal, resp. rate 16, height 5\' 2"  (1.575 m), weight 116 lb (52.617 kg), SpO2 100.00%. Gen:  Alert, well appearing.  Patient is oriented to person, place, time, and situation. CV: RRR LUNGS: CTA bilat ABD: soft, NT/ND, BS normal.  Mild pinkish macular rash in umbillical depression.  No maceration of skin, no streaking or tenderness or adenopathy. No CVA TTP.  CC UA: trace intact blood, small leukocytes, otherwise normal  IMPRESSION AND PLAN: Suspect UTI, complicated due to presence of ureteral stent. Will start bactrim DS 1 tab bid x 7d, send urine for c/s. Will ask urologist to send records.  I recommended she put some vaseline or zinc oxide-containing ointment on her umbillical irritation. An After Visit Summary was printed and given to the patient.  FOLLOW UP: prn

## 2014-01-24 LAB — URINE CULTURE

## 2014-01-25 MED ORDER — AMOXICILLIN 875 MG PO TABS: 875.0000 mg | ORAL_TABLET | Freq: Two times a day (BID) | ORAL | Status: DC

## 2014-01-26 ENCOUNTER — Other Ambulatory Visit: Payer: Self-pay | Admitting: Family Medicine

## 2014-04-08 ENCOUNTER — Telehealth: Payer: Self-pay

## 2014-04-08 ENCOUNTER — Other Ambulatory Visit: Payer: Self-pay

## 2014-04-08 DIAGNOSIS — E039 Hypothyroidism, unspecified: Secondary | ICD-10-CM

## 2014-04-08 NOTE — Telephone Encounter (Signed)
OK to do TSH, free T4, and T3 total as per pt request.  Dx is hypothyroidism.

## 2014-04-08 NOTE — Telephone Encounter (Signed)
Dr Anitra Lauth, pt would like to come in to get her thyroid checked tomorrow. She also wanted a T3 and T4. Is this ok to do? Please advise.

## 2014-04-09 ENCOUNTER — Other Ambulatory Visit (INDEPENDENT_AMBULATORY_CARE_PROVIDER_SITE_OTHER): Payer: BC Managed Care – PPO

## 2014-04-09 DIAGNOSIS — E039 Hypothyroidism, unspecified: Secondary | ICD-10-CM

## 2014-04-09 LAB — TSH: TSH: 11.65 u[IU]/mL — ABNORMAL HIGH (ref 0.35–4.50)

## 2014-04-09 LAB — T3, FREE: T3, Free: 2.8 pg/mL (ref 2.3–4.2)

## 2014-04-09 LAB — T4, FREE: Free T4: 0.64 ng/dL (ref 0.60–1.60)

## 2014-04-13 ENCOUNTER — Other Ambulatory Visit: Payer: Self-pay | Admitting: Family Medicine

## 2014-04-13 DIAGNOSIS — E039 Hypothyroidism, unspecified: Secondary | ICD-10-CM

## 2014-05-24 ENCOUNTER — Telehealth: Payer: Self-pay | Admitting: Family Medicine

## 2014-05-24 ENCOUNTER — Other Ambulatory Visit (INDEPENDENT_AMBULATORY_CARE_PROVIDER_SITE_OTHER): Payer: BC Managed Care – PPO

## 2014-05-24 DIAGNOSIS — E039 Hypothyroidism, unspecified: Secondary | ICD-10-CM

## 2014-05-24 DIAGNOSIS — R319 Hematuria, unspecified: Secondary | ICD-10-CM

## 2014-05-24 LAB — POCT URINALYSIS DIPSTICK
Bilirubin, UA: NEGATIVE
GLUCOSE UA: NEGATIVE
KETONES UA: NEGATIVE
Leukocytes, UA: NEGATIVE
Nitrite, UA: NEGATIVE
Protein, UA: NEGATIVE
SPEC GRAV UA: 1.015
Urobilinogen, UA: 0.2
pH, UA: 5.5

## 2014-05-24 LAB — TSH: TSH: 2.82 u[IU]/mL (ref 0.35–4.50)

## 2014-05-24 NOTE — Telephone Encounter (Signed)
Pt here today for lab visit for blood work, reported blood in urine at the end of stream/when wiping urethra.  Has shooting pain that she recognizes as c/w past pain with stone.  She will arrange f/u with her urologist ASAP. I did urinalysis and clx today.

## 2014-05-25 MED ORDER — LEVOTHYROXINE SODIUM 75 MCG PO TABS: ORAL_TABLET | ORAL | Status: DC

## 2014-05-26 LAB — URINE CULTURE: Colony Count: 50000

## 2014-05-26 NOTE — Progress Notes (Signed)
Noted  

## 2014-10-06 ENCOUNTER — Encounter (HOSPITAL_COMMUNITY): Admission: RE | Payer: Self-pay | Source: Ambulatory Visit

## 2014-10-06 ENCOUNTER — Ambulatory Visit (HOSPITAL_COMMUNITY)
Admission: RE | Admit: 2014-10-06 | Payer: BC Managed Care – PPO | Source: Ambulatory Visit | Admitting: Obstetrics and Gynecology

## 2014-10-06 SURGERY — SALPINGO-OOPHORECTOMY, LAPAROSCOPIC
Anesthesia: General | Laterality: Left

## 2014-11-23 ENCOUNTER — Ambulatory Visit (INDEPENDENT_AMBULATORY_CARE_PROVIDER_SITE_OTHER): Payer: BC Managed Care – PPO | Admitting: Family Medicine

## 2014-11-23 ENCOUNTER — Encounter: Payer: Self-pay | Admitting: Family Medicine

## 2014-11-23 VITALS — BP 105/69 | HR 63 | Temp 98.6°F | Resp 18 | Ht 62.0 in | Wt 121.0 lb

## 2014-11-23 DIAGNOSIS — J018 Other acute sinusitis: Secondary | ICD-10-CM

## 2014-11-23 DIAGNOSIS — E039 Hypothyroidism, unspecified: Secondary | ICD-10-CM

## 2014-11-23 LAB — TSH: TSH: 2.54 u[IU]/mL (ref 0.35–4.50)

## 2014-11-23 MED ORDER — AZITHROMYCIN 250 MG PO TABS: ORAL_TABLET | ORAL | Status: DC

## 2014-11-23 NOTE — Progress Notes (Signed)
Pre visit review using our clinic review tool, if applicable. No additional management support is needed unless otherwise documented below in the visit note. 

## 2014-11-23 NOTE — Progress Notes (Signed)
OFFICE NOTE  11/23/2014  CC:  Chief Complaint  Patient presents with  . Nasal Congestion    x Wednesday, worsened over the weekend  . Headache   HPI: Patient is a 74 y.o. Caucasian female who is here for resp complaints. Onset 6 days ago, worsening nasal congestion, PND, some blood tinged mucous, some cough which is alleviated somewhat by cough drops.  No fever.  No ST.  Ears hurt, some facial pain, +HA across forehead region.  Using flonase and vaseline in nose.  This started after being around a lot of diesel fuel fumes.  No SOB or wheezing.  Hx of hypothyroidism: TSH up in 2015, back to normal after dose adjustment but due for recheck now. Compliant with med daily.    Pertinent PMH:  Past medical, surgical, social, and family history reviewed and no changes are noted since last office visit.  MEDS: Not taking bactrim or amoxil Outpatient Prescriptions Prior to Visit  Medication Sig Dispense Refill  . Acetaminophen 500 MG TBDP Take 1 tablet by mouth as needed.    . cholecalciferol (VITAMIN D) 1000 UNITS tablet Take 1,000 Units by mouth daily.    . fluticasone (FLONASE) 50 MCG/ACT nasal spray 1-2 sprays in each nostril qAM 16 g 6  . levothyroxine (SYNTHROID, LEVOTHROID) 75 MCG tablet TAKE 1 TABLET DAILY 90 tablet 2  . amoxicillin (AMOXIL) 875 MG tablet Take 1 tablet (875 mg total) by mouth 2 (two) times daily. 14 tablet 0  . promethazine (PHENERGAN) 12.5 MG tablet 1-2 tabs po 6h prn nausea (Patient not taking: Reported on 11/23/2014) 20 tablet 0  . sulfamethoxazole-trimethoprim (BACTRIM DS) 800-160 MG per tablet Take 1 tablet by mouth 2 (two) times daily. 14 tablet 0   No facility-administered medications prior to visit.    PE: Blood pressure 105/69, pulse 63, temperature 98.6 F (37 C), temperature source Temporal, resp. rate 18, height 5\' 2"  (1.575 m), weight 121 lb (54.885 kg), SpO2 97 %. VS: noted--normal. Gen: alert, NAD, NONTOXIC APPEARING. HEENT: eyes without injection,  drainage, or swelling.  Ears: EACs clear, TMs with normal light reflex and landmarks.  Nose: no active exudate, with some dried, crusty exudate adherent to mildly injected mucosa.  No purulent d/c.  Mild  paranasal  TTP and frontal sinus TTP.  No facial swelling.  Throat and mouth without focal lesion.  No pharyngial swelling, erythema, or exudate.   Neck: supple, no LAD.   LUNGS: CTA bilat, nonlabored resps.   CV: RRR, no m/r/g. EXT: no c/c/e SKIN: no rash  IMPRESSION AND PLAN:  1) Acute sinusitis, infectious +? Irritant/allergic. Patient refused systemic steroids today. I will give Z-pack, encouraged saline nasal spray, continue flonase.  2) Hypothyroidism: she is due for TSH recheck.   Last TSH level normal but the one prior was elevated.  3) Patient reports recently dx'd right kidney/ureter problem, has seen a [facility] MD and a stent was put in ureter per her report.  Sounds like it was subsequently removed: need old records.  An After Visit Summary was printed and given to the patient.  FOLLOW UP: prn

## 2014-11-24 ENCOUNTER — Telehealth: Payer: Self-pay | Admitting: Family Medicine

## 2014-11-24 NOTE — Telephone Encounter (Signed)
Please call and leave message on her answering machine as she will be working until 5:30./DH

## 2014-11-24 NOTE — Telephone Encounter (Signed)
Left detailed message on pt's vm

## 2014-11-25 ENCOUNTER — Encounter: Payer: Self-pay | Admitting: *Deleted

## 2014-11-25 ENCOUNTER — Other Ambulatory Visit: Payer: Self-pay | Admitting: *Deleted

## 2014-11-25 MED ORDER — LEVOTHYROXINE SODIUM 75 MCG PO TABS: ORAL_TABLET | ORAL | Status: DC

## 2014-11-29 DIAGNOSIS — M19032 Primary osteoarthritis, left wrist: Secondary | ICD-10-CM

## 2014-11-29 HISTORY — DX: Primary osteoarthritis, left wrist: M19.032

## 2014-12-03 ENCOUNTER — Telehealth: Payer: Self-pay | Admitting: Family Medicine

## 2014-12-03 NOTE — Telephone Encounter (Signed)
Noted  

## 2014-12-03 NOTE — Telephone Encounter (Signed)
Patient states that her wrist & arm are "killing her". She said it has been hurting for a while & that she went to an Urgent Care to get xrays. They told her it was arthritis. Patient wants to know if arthritis could make you hurt all the way to her elbow. She wanted to know if we could xray it here in our office. Advised patient we can not do xray here but that she could make an appointment & then have xray done at various locations. She said she is going to see how it feels Monday & may make an appointment then if it is not any better. Patient did request copy of the xray report to be sent to our office so Dr. Anitra Lauth could see it.

## 2014-12-06 ENCOUNTER — Ambulatory Visit (INDEPENDENT_AMBULATORY_CARE_PROVIDER_SITE_OTHER): Payer: BC Managed Care – PPO | Admitting: Family Medicine

## 2014-12-06 ENCOUNTER — Ambulatory Visit (HOSPITAL_BASED_OUTPATIENT_CLINIC_OR_DEPARTMENT_OTHER)
Admission: RE | Admit: 2014-12-06 | Discharge: 2014-12-06 | Disposition: A | Discharge status: Home / Self Care | Payer: BC Managed Care – PPO | Source: Ambulatory Visit | Attending: Family Medicine | Admitting: Family Medicine

## 2014-12-06 ENCOUNTER — Encounter: Payer: Self-pay | Admitting: Family Medicine

## 2014-12-06 ENCOUNTER — Other Ambulatory Visit: Payer: Self-pay | Admitting: Family Medicine

## 2014-12-06 VITALS — BP 108/67 | HR 69 | Temp 98.6°F | Resp 18 | Ht 62.0 in | Wt 120.0 lb

## 2014-12-06 DIAGNOSIS — M25532 Pain in left wrist: Secondary | ICD-10-CM | POA: Diagnosis not present

## 2014-12-06 DIAGNOSIS — M25522 Pain in left elbow: Secondary | ICD-10-CM

## 2014-12-06 DIAGNOSIS — M8588 Other specified disorders of bone density and structure, other site: Secondary | ICD-10-CM | POA: Insufficient documentation

## 2014-12-06 DIAGNOSIS — M654 Radial styloid tenosynovitis [de Quervain]: Secondary | ICD-10-CM

## 2014-12-06 DIAGNOSIS — M19032 Primary osteoarthritis, left wrist: Secondary | ICD-10-CM

## 2014-12-06 DIAGNOSIS — M79632 Pain in left forearm: Secondary | ICD-10-CM | POA: Diagnosis not present

## 2014-12-06 DIAGNOSIS — M778 Other enthesopathies, not elsewhere classified: Secondary | ICD-10-CM | POA: Diagnosis not present

## 2014-12-06 DIAGNOSIS — M898X3 Other specified disorders of bone, forearm: Secondary | ICD-10-CM | POA: Diagnosis not present

## 2014-12-06 DIAGNOSIS — M1990 Unspecified osteoarthritis, unspecified site: Secondary | ICD-10-CM

## 2014-12-06 MED ORDER — FLUTICASONE PROPIONATE 50 MCG/ACT NA SUSP: NASAL | Status: DC

## 2014-12-06 NOTE — Telephone Encounter (Signed)
Patient had appt today

## 2014-12-06 NOTE — Progress Notes (Signed)
OFFICE NOTE  12/06/2014  CC: No chief complaint on file.    HPI: Patient is a 74 y.o. Caucasian female who is here for left hand/wrist pain for the last 2 mo, waxing and waning, worse with use (she is a bus school bus driver).  She saw [facility] 10/02/14 and x-ray showed deg changes in radiocarpal joint and some calcification of tringular fibrocartilage. Since that time the pain has been minimal if she is resting her hand and wrist but when she tries to drive her bus or use the wrist for anything else (even lifting a glass of tea) it hurts significantly and she finds that her grip is weakened.  Tylenol no help.  Leftover narcotic pain pills from urinary procedure were resorted to in the last few days and these have helped her rest.  Has remote hx of joint replacement in left radiocarpal region per her report (about 10 yrs ago or so).  Pertinent PMH:  Past medical, surgical, social, and family history reviewed and no changes are noted since last office visit.  MEDS:  Outpatient Prescriptions Prior to Visit  Medication Sig Dispense Refill  . Acetaminophen 500 MG TBDP Take 1 tablet by mouth as needed.    . cholecalciferol (VITAMIN D) 1000 UNITS tablet Take 1,000 Units by mouth daily.    . fluticasone (FLONASE) 50 MCG/ACT nasal spray 1-2 sprays in each nostril qAM 16 g 3  . levothyroxine (SYNTHROID, LEVOTHROID) 75 MCG tablet TAKE 1 TABLET DAILY 90 tablet 2  . azithromycin (ZITHROMAX) 250 MG tablet 2 tabs po qd x 1d, then 1 tab po qd x 4d 6 tablet 0   No facility-administered medications prior to visit.    PE: Blood pressure 108/67, pulse 69, temperature 98.6 F (37 C), temperature source Temporal, resp. rate 18, height 5\' 2"  (1.575 m), weight 120 lb (54.432 kg), SpO2 98 %. Gen: Alert, well appearing.  Patient is oriented to person, place, time, and situation. Left forearm, elbow, and wrist without swelling, erythema, or warmth. She has normal ROM of elbow, forearm, and wrist, but forearm  supination is painful for her as is wrist eversion.  She has tenderness in bony aspects of left elbow, entire left radius and left ulna, with more exquisite TTP at articulation of radius and wrist and ulna and wrist. Also + finkelsteins, + TTP over left 1st metacarpal and the extensor and abductor tendons for this digit, extending across wrist into distal forearm.  I feel no mass, no crepitus.  No tenderness of hand or fingers (other than 1st metacarpal).  IMPRESSION AND PLAN:  Acute/subacute left wrist and forearm/elbow pain. Also with signs of De Quervain's tenosynovitis of left thumb.   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  Will obtain x-rays of left elbow, left forearm, left wrist. She declined any pain meds today.  An After Visit Summary was printed and given to the patient.   FOLLOW UP: To be determined based on results of pending workup.

## 2014-12-06 NOTE — Progress Notes (Signed)
Pre visit review using our clinic review tool, if applicable. No additional management support is needed unless otherwise documented below in the visit note. 

## 2014-12-07 ENCOUNTER — Other Ambulatory Visit: Payer: Self-pay | Admitting: Family Medicine

## 2014-12-07 ENCOUNTER — Encounter: Payer: Self-pay | Admitting: Family Medicine

## 2014-12-07 DIAGNOSIS — M19022 Primary osteoarthritis, left elbow: Secondary | ICD-10-CM

## 2014-12-07 DIAGNOSIS — M79632 Pain in left forearm: Secondary | ICD-10-CM

## 2014-12-07 DIAGNOSIS — M19032 Primary osteoarthritis, left wrist: Secondary | ICD-10-CM

## 2014-12-13 ENCOUNTER — Telehealth: Payer: Self-pay | Admitting: Family Medicine

## 2014-12-13 NOTE — Telephone Encounter (Signed)
Phone note opened in error.  

## 2015-01-17 ENCOUNTER — Encounter: Payer: Self-pay | Admitting: Family Medicine

## 2015-01-27 ENCOUNTER — Other Ambulatory Visit: Payer: Self-pay | Admitting: Family Medicine

## 2015-01-31 ENCOUNTER — Telehealth: Payer: Self-pay | Admitting: Family Medicine

## 2015-01-31 NOTE — Telephone Encounter (Signed)
Sorry, must be seen in office.

## 2015-01-31 NOTE — Telephone Encounter (Signed)
Please advise 

## 2015-01-31 NOTE — Telephone Encounter (Signed)
LMOVM for pt to return call 

## 2015-01-31 NOTE — Telephone Encounter (Signed)
Pt. Is asking for Dr. Anitra Lauth to call her in a RX for a Z-pak as her allergies is driving her crazy and she has terrible sinus pressure and a headache.  Justina can be reached at [phone].

## 2015-02-02 NOTE — Telephone Encounter (Signed)
LMOM for pt to CB to schedule an appointment to be seen by Dr. Anitra Lauth.  Okay to leave detailed message per DPR.

## 2015-03-14 ENCOUNTER — Telehealth: Payer: Self-pay

## 2015-03-14 NOTE — Telephone Encounter (Signed)
What should I request, last year's records or the entire medical record?

## 2015-03-14 NOTE — Telephone Encounter (Signed)
Patient stated that she had one last year at [facility]. Levander Campion can you please get this records.

## 2015-03-15 NOTE — Telephone Encounter (Signed)
Done

## 2015-03-15 NOTE — Telephone Encounter (Signed)
Just last years Mammo

## 2015-06-02 ENCOUNTER — Encounter: Payer: Self-pay | Admitting: Family Medicine

## 2015-07-25 ENCOUNTER — Other Ambulatory Visit: Payer: Self-pay | Admitting: Family Medicine

## 2015-07-28 ENCOUNTER — Encounter: Payer: Self-pay | Admitting: Family Medicine

## 2015-07-28 ENCOUNTER — Ambulatory Visit (INDEPENDENT_AMBULATORY_CARE_PROVIDER_SITE_OTHER): Payer: BC Managed Care – PPO | Admitting: Family Medicine

## 2015-07-28 VITALS — BP 115/74 | HR 61 | Temp 97.8°F | Resp 16 | Ht 62.0 in | Wt 118.0 lb

## 2015-07-28 DIAGNOSIS — E039 Hypothyroidism, unspecified: Secondary | ICD-10-CM | POA: Diagnosis not present

## 2015-07-28 DIAGNOSIS — Z23 Encounter for immunization: Secondary | ICD-10-CM | POA: Diagnosis not present

## 2015-07-28 DIAGNOSIS — Z0000 Encounter for general adult medical examination without abnormal findings: Secondary | ICD-10-CM | POA: Diagnosis not present

## 2015-07-28 DIAGNOSIS — R1013 Epigastric pain: Secondary | ICD-10-CM | POA: Diagnosis not present

## 2015-07-28 DIAGNOSIS — E785 Hyperlipidemia, unspecified: Secondary | ICD-10-CM | POA: Diagnosis not present

## 2015-07-28 LAB — CBC WITH DIFFERENTIAL/PLATELET
BASOS ABS: 0.1 10*3/uL (ref 0.0–0.1)
Basophils Relative: 0.8 % (ref 0.0–3.0)
Eosinophils Absolute: 0.2 10*3/uL (ref 0.0–0.7)
Eosinophils Relative: 2.2 % (ref 0.0–5.0)
HEMATOCRIT: 39.8 % (ref 36.0–46.0)
Hemoglobin: 13.3 g/dL (ref 12.0–15.0)
LYMPHS PCT: 25.5 % (ref 12.0–46.0)
Lymphs Abs: 2 10*3/uL (ref 0.7–4.0)
MCHC: 33.4 g/dL (ref 30.0–36.0)
MCV: 89 fl (ref 78.0–100.0)
MONOS PCT: 9 % (ref 3.0–12.0)
Monocytes Absolute: 0.7 10*3/uL (ref 0.1–1.0)
Neutro Abs: 5 10*3/uL (ref 1.4–7.7)
Neutrophils Relative %: 62.5 % (ref 43.0–77.0)
Platelets: 235 10*3/uL (ref 150.0–400.0)
RBC: 4.47 Mil/uL (ref 3.87–5.11)
RDW: 13.7 % (ref 11.5–15.5)
WBC: 8 10*3/uL (ref 4.0–10.5)

## 2015-07-28 LAB — COMPREHENSIVE METABOLIC PANEL
ALBUMIN: 4.1 g/dL (ref 3.5–5.2)
ALT: 21 U/L (ref 0–35)
AST: 25 U/L (ref 0–37)
Alkaline Phosphatase: 55 U/L (ref 39–117)
BILIRUBIN TOTAL: 0.7 mg/dL (ref 0.2–1.2)
BUN: 12 mg/dL (ref 6–23)
CALCIUM: 9.4 mg/dL (ref 8.4–10.5)
CO2: 31 meq/L (ref 19–32)
Chloride: 103 mEq/L (ref 96–112)
Creatinine, Ser: 0.73 mg/dL (ref 0.40–1.20)
GFR: 83.35 mL/min (ref >60.00)
Glucose, Bld: 94 mg/dL (ref 70–99)
Potassium: 4.2 mEq/L (ref 3.5–5.1)
Sodium: 140 mEq/L (ref 135–145)
Total Protein: 7 g/dL (ref 6.0–8.3)

## 2015-07-28 LAB — LIPID PANEL
CHOL/HDL RATIO: 4
CHOLESTEROL: 211 mg/dL — AB (ref 0–200)
HDL: 50.1 mg/dL (ref >39.00)
LDL CALC: 138 mg/dL — AB (ref 0–99)
NonHDL: 160.75
TRIGLYCERIDES: 112 mg/dL (ref 0.0–149.0)
VLDL: 22.4 mg/dL (ref 0.0–40.0)

## 2015-07-28 LAB — TSH: TSH: 0.64 u[IU]/mL (ref 0.35–4.50)

## 2015-07-28 MED ORDER — PANTOPRAZOLE SODIUM 40 MG PO TBEC: 40.0000 mg | DELAYED_RELEASE_TABLET | Freq: Every day | ORAL | Status: DC

## 2015-07-28 NOTE — Progress Notes (Signed)
Pre visit review using our clinic review tool, if applicable. No additional management support is needed unless otherwise documented below in the visit note. 

## 2015-07-28 NOTE — Progress Notes (Signed)
OFFICE VISIT  07/28/2015   CC:  Chief Complaint  Patient presents with  . Follow-up    Thyroid, cholestrol, plus labs for wellness form  . Gas    HPI:    Patient is a 74 y.o. Caucasian female who presents for f/u hypothyroidism.  Compliant with levothyroxine 75 mcg qd.  Also c/o about a year of feeling gas, lots of burping, and generalized upper abd discomfort after eating.  Says no matter what she eats.  Denies heartburn feeling.  Lasts a long time.  She goes on and on about the belching today. No constipation or diarrhea.  No n/v.  Tried taking mylanta.  One day recently she took a 1/2 of her husband's protonix 40mg  and this gave her relief that day. Says she was once on a daily acid suppression med in the past.  Has been fasting for labs today, has hx of elevated cholesterol and asks for recheck of lipid panel today as well as CMET.  Past Medical History  Diagnosis Date  . Hyperlipidemia     per pt report 05/09/12  . Hypothyroidism   . History of adenomatous polyp of colon 2003, 2006, 2013;2016    Repeat 2013 showed tubular adenoma x 3, with no high grade dysplasia.  2016 no polyps-recall 5 yrs  . H/O angular cheilitis     Zinc normal, vit B12 low normal (2012)  . H/O vitamin D deficiency 2011    Came back to normal range with replacement therapydd  . DDD (degenerative disc disease), cervical   . Mechanical back pain   . Lower leg pain     Bilateral, crampy--ABIs/dopplers NORMAL 04/2013.  . Adnexal cyst 07/2013    Left-3cm-noted on CT abd/pelv--f/u pelvic u/s did not show anything (L ovary not visualized, likely secondary to bowel gas)-repeat pelvic u/s after 07/2014.  . 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 Cholecystectomy  1980s  . Thyroidectomy, partial  1972  . Ganglion cyst excision  2003    Right wrist, with excision of some triquetrum spurs  . Wrist surgery  2006     For left thumb carpo-metacarpal arthritis  . Tonsillectomy and adenoidectomy  age 43  . Gastric banding port revision    . Band hemorrhoidectomy    . Colonoscopy w/ biopsies and polypectomy  06/02/12;05/30/15    No polyps 2016-recall 5 yrs.  +internal hem.  Normal ileoscopy.    Outpatient Prescriptions Prior to Visit  Medication Sig Dispense Refill  . Acetaminophen 500 MG TBDP Take 1 tablet by mouth as needed.    . cholecalciferol (VITAMIN D) 1000 UNITS tablet Take 1,000 Units by mouth daily.    . fluticasone (FLONASE) 50 MCG/ACT nasal spray 1-2 sprays in each nostril qAM 16 g 3  . levothyroxine (SYNTHROID, LEVOTHROID) 75 MCG tablet TAKE 1 TABLET DAILY 90 tablet 2  . levothyroxine (SYNTHROID, LEVOTHROID) 75 MCG tablet TAKE 1 TABLET DAILY (Patient not taking: Reported on 07/28/2015) 90 tablet 0   No facility-administered medications prior to visit.    Allergies  Allergen Reactions  . Codeine Other (See Comments)    Not specified in old records.  Carlton Adam [Propoxyphene N-Acetaminophen] Other (See Comments)    Not specified in old records  . Hydrocodone Other (See Comments)    Not specified in old records    ROS As per HPI  PE: Blood pressure 115/74, pulse 61,  temperature 97.8 F (36.6 C), temperature source Oral, resp. rate 16, height 5\' 2"  (1.575 m), weight 118 lb (53.524 kg), SpO2 97 %.  Pt examined with Sharen Hones, CMA, as chaperone.  Gen: Alert, well appearing.  Patient is oriented to person, place, time, and situation. ENT: Ears: EACs clear, normal epithelium.  TMs with good light reflex and landmarks bilaterally.  Eyes: no injection, icteris, swelling, or exudate.  EOMI, PERRLA. Nose: mild turbinate injection and edema/swelling.  No purulent d/c in nose.  No tenderness of paranasal sinuses.  Mouth: lips without lesion/swelling.  Oral mucosa pink and moist.   Oropharynx without erythema, exudate, or swelling.  Neck - No masses or thyromegaly or limitation in range of  motion CV: RRR, no m/r/g.   LUNGS: CTA bilat, nonlabored resps, good aeration in all lung fields. ABD: soft, nondistended, BS normal.  Epigastrum is TTP in midline.  No mass, no HSM or bruit. EXT: no clubbing, cyanosis, or edema.   LABS:  Lab Results  Component Value Date   TSH 2.54 11/23/2014     Chemistry      Component Value Date/Time   NA 140 07/27/2013 1138   K 3.8 07/27/2013 1138   CL 105 07/27/2013 1138   CO2 29 07/27/2013 1138   BUN 7 07/27/2013 1138   CREATININE 0.7 07/27/2013 1138      Component Value Date/Time   CALCIUM 9.0 07/27/2013 1138   ALKPHOS 41 07/27/2013 1138   AST 25 07/27/2013 1138   ALT 19 07/27/2013 1138   BILITOT 0.8 07/27/2013 1138     Lab Results  Component Value Date   WBC 7.3 07/27/2013   HGB 12.6 07/27/2013   HCT 37.4 07/27/2013   MCV 90.0 07/27/2013   PLT 225.0 07/27/2013   Lab Results  Component Value Date   CHOL 219* 05/14/2012   HDL 49.50 05/14/2012   LDLDIRECT 143.5 05/14/2012   TRIG 93.0 05/14/2012   CHOLHDL 4 05/14/2012   IMPRESSION AND PLAN:  1) Dyspepsia, excessive upper GI gas.   Diet and gas educational handout given today. Start pantoprazole 40mg  qd. CBC today.  2) Hypothyroidism: recheck TSH today.  3) History of hyperlipidemia: pt is fasting today so we'll check FLP and CMET.  4) Prev health care: prevnar 13 today.  She has had pneumovax 23 through her employer per her report. She says she'll get flu vaccine through employer this season as well.  An After Visit Summary was printed and given to the patient.  FOLLOW UP: Return in about 6 months (around 01/25/2016).

## 2015-07-28 NOTE — Addendum Note (Signed)
Addended by: Lanae Crumbly on: 07/28/2015 11:07 AM   Modules accepted: Orders

## 2015-07-29 ENCOUNTER — Other Ambulatory Visit: Payer: Self-pay | Admitting: Family Medicine

## 2015-07-29 ENCOUNTER — Encounter: Payer: Self-pay | Admitting: Family Medicine

## 2015-07-29 MED ORDER — LEVOTHYROXINE SODIUM 75 MCG PO TABS: ORAL_TABLET | ORAL | Status: DC

## 2015-08-01 ENCOUNTER — Ambulatory Visit (INDEPENDENT_AMBULATORY_CARE_PROVIDER_SITE_OTHER): Payer: BC Managed Care – PPO

## 2015-08-01 DIAGNOSIS — Z23 Encounter for immunization: Secondary | ICD-10-CM | POA: Diagnosis not present

## 2015-08-01 MED ORDER — ATORVASTATIN CALCIUM 20 MG PO TABS
20.0000 mg | ORAL_TABLET | Freq: Every day | ORAL | Status: DC
Start: 2015-08-01 — End: 2015-10-20

## 2015-08-01 NOTE — Addendum Note (Signed)
Addended by: Lanae Crumbly on: 08/01/2015 12:02 PM   Modules accepted: Orders

## 2015-09-27 ENCOUNTER — Telehealth: Payer: Self-pay | Admitting: Family Medicine

## 2015-09-27 NOTE — Telephone Encounter (Signed)
Spoke to pharmacy and they stated that pts refill is not due to ship out til after 10/21/15. Pt advised and voiced understanding.

## 2015-09-27 NOTE — Telephone Encounter (Signed)
Please send Rx to [facility] for thyroid medication

## 2015-10-20 ENCOUNTER — Other Ambulatory Visit: Payer: Self-pay | Admitting: *Deleted

## 2015-10-20 ENCOUNTER — Other Ambulatory Visit (INDEPENDENT_AMBULATORY_CARE_PROVIDER_SITE_OTHER): Payer: BC Managed Care – PPO

## 2015-10-20 DIAGNOSIS — E785 Hyperlipidemia, unspecified: Secondary | ICD-10-CM

## 2015-10-20 LAB — LIPID PANEL
CHOL/HDL RATIO: 4
CHOLESTEROL: 209 mg/dL — AB (ref 0–200)
HDL: 50.1 mg/dL (ref >39.00)
LDL CALC: 143 mg/dL — AB (ref 0–99)
NonHDL: 158.82
TRIGLYCERIDES: 78 mg/dL (ref 0.0–149.0)
VLDL: 15.6 mg/dL (ref 0.0–40.0)

## 2015-10-20 MED ORDER — ATORVASTATIN CALCIUM 40 MG PO TABS: 40.0000 mg | ORAL_TABLET | Freq: Every day | ORAL | Status: DC

## 2015-11-07 ENCOUNTER — Telehealth: Payer: Self-pay | Admitting: Family Medicine

## 2015-11-07 DIAGNOSIS — E785 Hyperlipidemia, unspecified: Secondary | ICD-10-CM

## 2015-11-07 MED ORDER — ATORVASTATIN CALCIUM 40 MG PO TABS: 40.0000 mg | ORAL_TABLET | Freq: Every day | ORAL | Status: DC

## 2015-11-07 MED ORDER — LEVOTHYROXINE SODIUM 75 MCG PO TABS: ORAL_TABLET | ORAL | Status: DC

## 2015-11-07 NOTE — Telephone Encounter (Signed)
LOV: 07/28/15 NOV: None  RF request for levothyroxine Last written: 07/29/15 #90 w/ 2RF  07/28/15 TSH 0.64  RF request for atorvastatin Last written: 10/20/15 #30 w/ 3RF  Rx's sent. Pt advised and voiced understanding.

## 2015-11-07 NOTE — Telephone Encounter (Signed)
Patient has new insurance needs Rx to go to [place]. She needs a refill of Levothyroxine. She also said that if the doctor wants to "keep her on her cholesterol" medicine that she will also need for that Rx to go to the same place.

## 2016-01-18 ENCOUNTER — Encounter: Payer: Self-pay | Admitting: Family Medicine

## 2016-01-18 ENCOUNTER — Ambulatory Visit (INDEPENDENT_AMBULATORY_CARE_PROVIDER_SITE_OTHER): Payer: BC Managed Care – PPO | Admitting: Family Medicine

## 2016-01-18 VITALS — BP 122/81 | HR 60 | Temp 97.5°F | Resp 16 | Ht 62.0 in | Wt 116.5 lb

## 2016-01-18 DIAGNOSIS — H1011 Acute atopic conjunctivitis, right eye: Secondary | ICD-10-CM

## 2016-01-18 DIAGNOSIS — L821 Other seborrheic keratosis: Secondary | ICD-10-CM | POA: Diagnosis not present

## 2016-01-18 NOTE — Progress Notes (Signed)
Pre visit review using our clinic review tool, if applicable. No additional management support is needed unless otherwise documented below in the visit note. 

## 2016-01-18 NOTE — Progress Notes (Signed)
OFFICE VISIT  01/18/2016   CC:  Chief Complaint  Patient presents with  . Mole removed     HPI:    Patient is a 75 y.o. Caucasian female who presents for concern about a pigmented skin lesion on left upper chest wall, present for " a while" and gets irritated some when seat belt straps across it.  Has several similar lesions like this on other parts of her body.    Also asks me to look at her eyes, says she's having intermittent itching and "feels like a little film" over R eye.  Says otc "drops" (she says they are not allergy drops, specifically) take care of it and then it recurs. No eye pain or swelling.  Past Medical History  Diagnosis Date  . Hyperlipidemia     per pt report 05/09/12; also on labs 06/2015  . Hypothyroidism   . History of adenomatous polyp of colon 2003, 2006, 2013;2016    Repeat 2013 showed tubular adenoma x 3, with no high grade dysplasia.  2016 no polyps-recall 5 yrs  . H/O angular cheilitis     Zinc normal, vit B12 low normal (2012)  . H/O vitamin D deficiency 2011    Came back to normal range with replacement therapydd  . DDD (degenerative disc disease), cervical   . Mechanical back pain   . Lower leg pain     Bilateral, crampy--ABIs/dopplers NORMAL 04/2013.  . Adnexal cyst 07/2013    Left-3cm-noted on CT abd/pelv--f/u pelvic u/s did not show anything (L ovary not visualized, likely secondary to bowel gas)-repeat pelvic u/s after 07/2014.  . 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 Cholecystectomy  1980s  . Thyroidectomy, partial  1972  . Ganglion cyst excision  2003    Right wrist, with excision of some triquetrum spurs  . Wrist surgery  2006    For left thumb carpo-metacarpal arthritis  . Tonsillectomy and adenoidectomy  age 62  . Gastric banding port revision    . Band hemorrhoidectomy    . Colonoscopy w/ biopsies and polypectomy  06/02/12;05/30/15    No  polyps 2016-recall 5 yrs.  +internal hem.  Normal ileoscopy.    Outpatient Prescriptions Prior to Visit  Medication Sig Dispense Refill  . Acetaminophen 500 MG TBDP Take 1 tablet by mouth as needed.    Marland Kitchen atorvastatin (LIPITOR) 40 MG tablet Take 1 tablet (40 mg total) by mouth daily. 90 tablet 0  . cholecalciferol (VITAMIN D) 1000 UNITS tablet Take 1,000 Units by mouth daily.    . fluticasone (FLONASE) 50 MCG/ACT nasal spray 1-2 sprays in each nostril qAM 16 g 3  . levothyroxine (SYNTHROID, LEVOTHROID) 75 MCG tablet TAKE 1 TABLET DAILY 90 tablet 3  . pantoprazole (PROTONIX) 40 MG tablet Take 1 tablet (40 mg total) by mouth daily. 90 tablet 3   No facility-administered medications prior to visit.    Allergies  Allergen Reactions  . Codeine Other (See Comments)    Not specified in old records.  Carlton Adam [Propoxyphene N-Acetaminophen] Other (See Comments)    Not specified in old records  . Hydrocodone Other (See Comments)    Not specified in old records    ROS As per HPI  PE: Blood pressure 122/81, pulse 60, temperature 97.5 F (36.4 C), temperature source Oral, resp. rate 16, height 5\' 2"  (1.575 m), weight 116 lb 8 oz (52.844 kg),  SpO2 97 %. Gen: Alert, well appearing.  Patient is oriented to person, place, time, and situation. Eyes: no swelling, erythema, discharge, or other abnormality noted in either eye. L upper chest wall near L clavicle there is a 1/2 cm diameter "stuck-on" brownish, plaque-like lesion.     LABS:  none  IMPRESSION AND PLAN:  1) Seborrheic keratosis: left upper chest wall.  Reassured pt of benign nature of the lesion. She decided to have nothing done about it, monitor for now.  2) Allergic conjunctivitis, mainly right eye: seems to be treated well by her current otc drops.  Reassured pt her eyes looked good today.   No additional problem suspected.  An After Visit Summary was printed and given to the patient.  FOLLOW UP: Return if symptoms worsen  or fail to improve.

## 2016-01-25 ENCOUNTER — Ambulatory Visit: Payer: BC Managed Care – PPO | Admitting: Family Medicine

## 2016-02-07 ENCOUNTER — Encounter: Payer: Self-pay | Admitting: Family Medicine

## 2016-02-07 ENCOUNTER — Ambulatory Visit (INDEPENDENT_AMBULATORY_CARE_PROVIDER_SITE_OTHER): Payer: BC Managed Care – PPO | Admitting: Family Medicine

## 2016-02-07 VITALS — BP 104/68 | HR 59 | Temp 97.5°F | Resp 16 | Ht 62.0 in | Wt 116.8 lb

## 2016-02-07 DIAGNOSIS — E785 Hyperlipidemia, unspecified: Secondary | ICD-10-CM

## 2016-02-07 DIAGNOSIS — E039 Hypothyroidism, unspecified: Secondary | ICD-10-CM

## 2016-02-07 DIAGNOSIS — T887XXA Unspecified adverse effect of drug or medicament, initial encounter: Secondary | ICD-10-CM | POA: Diagnosis not present

## 2016-02-07 DIAGNOSIS — T50905A Adverse effect of unspecified drugs, medicaments and biological substances, initial encounter: Secondary | ICD-10-CM

## 2016-02-07 LAB — LIPID PANEL
Cholesterol: 152 mg/dL (ref 0–200)
HDL: 51.2 mg/dL (ref >39.00)
LDL CALC: 87 mg/dL (ref 0–99)
NONHDL: 100.52
Total CHOL/HDL Ratio: 3
Triglycerides: 66 mg/dL (ref 0.0–149.0)
VLDL: 13.2 mg/dL (ref 0.0–40.0)

## 2016-02-07 MED ORDER — PRAVASTATIN SODIUM 40 MG PO TABS: 40.0000 mg | ORAL_TABLET | Freq: Every day | ORAL | Status: DC

## 2016-02-07 NOTE — Progress Notes (Signed)
Pre visit review using our clinic review tool, if applicable. No additional management support is needed unless otherwise documented below in the visit note. 

## 2016-02-07 NOTE — Progress Notes (Signed)
OFFICE VISIT  02/07/2016   CC:  Chief Complaint  Patient presents with  . Follow-up    Pt is fasting.    HPI:    Patient is a 75 y.o. Caucasian female who presents for 6 mo f/u hypothyroidism, HLD. Her health panel labs 6 mo ago were all normal except cholesterol was elevated and I recommended she start on atorvastatin, which she did. Feels like energy is down + having cramps in hands + muscle aches diffusely on the med. Also HA's.   She has been working on a better diet the last 6 mo.  Taking thyroid med correctly every day.  Denies side effects.  TSH 6 mo ago was wnl.  Past Medical History  Diagnosis Date  . Hyperlipidemia     per pt report 05/09/12; also on labs 06/2015  . Hypothyroidism   . History of adenomatous polyp of colon 2003, 2006, 2013;2016    Repeat 2013 showed tubular adenoma x 3, with no high grade dysplasia.  2016 no polyps-recall 5 yrs  . H/O angular cheilitis     Zinc normal, vit B12 low normal (2012)  . H/O vitamin D deficiency 2011    Came back to normal range with replacement therapydd  . DDD (degenerative disc disease), cervical   . Mechanical back pain   . Lower leg pain     Bilateral, crampy--ABIs/dopplers NORMAL 04/2013.  . Adnexal cyst 07/2013    Left-3cm-noted on CT abd/pelv--f/u pelvic u/s did not show anything (L ovary not visualized, likely secondary to bowel gas)-repeat pelvic u/s after 07/2014.  . 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 Cholecystectomy  1980s  . Thyroidectomy, partial  1972  . Ganglion cyst excision  2003    Right wrist, with excision of some triquetrum spurs  . Wrist surgery  2006    For left thumb carpo-metacarpal arthritis  . Tonsillectomy and adenoidectomy  age 5  . Gastric banding port revision    . Band hemorrhoidectomy    . Colonoscopy w/ biopsies and polypectomy  06/02/12;05/30/15    No polyps 2016-recall 5 yrs.   +internal hem.  Normal ileoscopy.    Outpatient Prescriptions Prior to Visit  Medication Sig Dispense Refill  . Acetaminophen 500 MG TBDP Take 1 tablet by mouth as needed.    . cholecalciferol (VITAMIN D) 1000 UNITS tablet Take 1,000 Units by mouth daily.    . fluticasone (FLONASE) 50 MCG/ACT nasal spray 1-2 sprays in each nostril qAM 16 g 3  . levothyroxine (SYNTHROID, LEVOTHROID) 75 MCG tablet TAKE 1 TABLET DAILY 90 tablet 3  . pantoprazole (PROTONIX) 40 MG tablet Take 1 tablet (40 mg total) by mouth daily. 90 tablet 3  . atorvastatin (LIPITOR) 40 MG tablet Take 1 tablet (40 mg total) by mouth daily. 90 tablet 0   No facility-administered medications prior to visit.    Allergies  Allergen Reactions  . Codeine Other (See Comments)    Not specified in old records.  Carlton Adam [Propoxyphene N-Acetaminophen] Other (See Comments)    Not specified in old records  . Hydrocodone Other (See Comments)    Not specified in old records    ROS As per HPI  PE: Blood pressure 104/68, pulse 59, temperature 97.5 F (36.4 C), temperature source Oral, resp. rate 16, height 5\' 2"  (1.575 m), weight 116 lb 12 oz (52.957 kg), SpO2 99 %. Gen: Alert,  well appearing.  Patient is oriented to person, place, time, and situation. AFFECT: pleasant, lucid thought and speech. No further exam today.  LABS:  Lab Results  Component Value Date   TSH 0.64 07/28/2015   Lab Results  Component Value Date   WBC 8.0 07/28/2015   HGB 13.3 07/28/2015   HCT 39.8 07/28/2015   MCV 89.0 07/28/2015   PLT 235.0 07/28/2015   Lab Results  Component Value Date   CREATININE 0.73 07/28/2015   BUN 12 07/28/2015   NA 140 07/28/2015   K 4.2 07/28/2015   CL 103 07/28/2015   CO2 31 07/28/2015   Lab Results  Component Value Date   ALT 21 07/28/2015   AST 25 07/28/2015   ALKPHOS 55 07/28/2015   BILITOT 0.7 07/28/2015   Lab Results  Component Value Date   CHOL 209* 10/20/2015   Lab Results  Component Value  Date   HDL 50.10 10/20/2015   Lab Results  Component Value Date   LDLCALC 143* 10/20/2015   Lab Results  Component Value Date   TRIG 78.0 10/20/2015   Lab Results  Component Value Date   CHOLHDL 4 10/20/2015    IMPRESSION AND PLAN:  1) Hyperlipidemia: intolerant of atorva 40mg  qd.  Stop this med.  Start pravastatin 40mg  qd. Wait 1 week before starting the pravastatin. Check FLP today.  2) Hypothyroidism; The current medical regimen is effective;  continue present plan and medications.  An After Visit Summary was printed and given to the patient.  FOLLOW UP: Return in about 6 months (around 08/08/2016) for annual CPE (fasting).  Signed:  Crissie Sickles, MD           02/07/2016

## 2016-03-01 ENCOUNTER — Telehealth: Payer: Self-pay | Admitting: Family Medicine

## 2016-03-01 NOTE — Telephone Encounter (Signed)
Stop the pravastatin and see if symptoms resolve over the next 1-2 weeks.

## 2016-03-01 NOTE — Telephone Encounter (Signed)
Patient feels horrible since starting Pravastatin. She can't eat or drink anything, all she does is belch. She is also having dizzy spells. Please call her.

## 2016-03-01 NOTE — Telephone Encounter (Signed)
Left message for pt to call back  °

## 2016-03-01 NOTE — Telephone Encounter (Signed)
Please advise. Thanks.  

## 2016-03-02 NOTE — Telephone Encounter (Signed)
Noted  

## 2016-03-02 NOTE — Telephone Encounter (Signed)
Patient called back and said that sx started Sunday night and she stopped the pravastatin on Monday and is feeling a bit better but no real relief. She was up Monday at [facility] in a sweat and could not stop belching has headaches all day as well. Feels like it might be more of a stomach/intestinal issue. She is going to [place] and will return Monday. Says she will call back in 7 days to advise if stopping the pravastatin has helped.

## 2016-03-06 ENCOUNTER — Encounter: Payer: Self-pay | Admitting: Family Medicine

## 2016-03-06 NOTE — Telephone Encounter (Signed)
Patient's stomach is better. She is still having trouble when she stands up she it is hard to move.

## 2016-03-06 NOTE — Telephone Encounter (Signed)
Noted  

## 2016-03-29 NOTE — Telephone Encounter (Signed)
Per Estill Bamberg pt needs to come in for hospital follow up once she has been discharged. Please request records and have pt schedule apt. Thanks.

## 2016-03-29 NOTE — Telephone Encounter (Signed)
Patient is at the [facility]. Has been there 3 days. Feels she is going to need to be seen by urology or renal specialist. Encouraged her to make follow-up appointment, patient did not make appointment. Please call patient early next week to follow up.

## 2016-04-03 ENCOUNTER — Telehealth: Payer: Self-pay | Admitting: *Deleted

## 2016-04-03 NOTE — Telephone Encounter (Signed)
Patient is scheduled for this Friday 04/06/16, notes are in [place] per Vinnie Level

## 2016-04-03 NOTE — Telephone Encounter (Signed)
PLEASE NOTE: All timestamps contained within this report are represented as [place] Standard Time. CONFIDENTIALTY NOTICE: This fax transmission is intended only for the addressee. It contains information that is legally privileged, confidential or otherwise protected from use or disclosure. If you are not the intended recipient, you are strictly prohibited from reviewing, disclosing, copying using or disseminating any of this information or taking any action in reliance on or regarding this information. If you have received this fax in error, please notify us immediately by telephone so that we can arrange for its return to [place]. Phone: [phone], Toll-Free: [phone], Fax: [phone] Page: 1 of 2 Call Id: [id number] [place] Patient Name: Margaret Holmes Gender: Female DOB: [birth year] Age: 75 Y 20 M 14 D Return Phone Number: [phone] (Primary) Address: City/State/Zip: [place] [address] Client [facility] - Client Client Site [place] [facility] Physician Crissie Sickles - MD Contact Type Call Who Is Calling Patient / Member / Family / Caregiver Call Type Triage / Clinical Relationship To Patient Self Return Phone Number [phone] (Primary) Chief Complaint Arm Pain (known cause) Reason for Call Symptomatic / Request for Health Information Initial Comment Caller states she had IV's in her left arm and now has redness and swelling at the needle site. IV was removed when she was discharged on Thursday PreDisposition Home Care Translation No Nurse Assessment Nurse: Tamala Julian, RN, Joelene Millin Date/Time Eilene Ghazi Time): 04/02/2016 8:29:24 PM Confirm and document reason for call. If symptomatic, describe symptoms. You must click the next button to save text entered. ---Caller states she had IV's in her left arm and now has redness and swelling at the  needle site. IV was removed when she was discharged on Thursday. Has the patient traveled out of the country within the last 30 days? ---No Does the patient have any new or worsening symptoms? ---Yes Will a triage be completed? ---Yes Related visit to physician within the last 2 weeks? ---Yes Does the PT have any chronic conditions? (i.e. diabetes, asthma, etc.) ---Yes List chronic conditions. 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 ---No Guidelines Guideline Title Affirmed Question Affirmed Notes Nurse Date/Time (Eastern Time) IV Site (Skin) Symptoms Arm is swollen, new onset (or leg swelling if IV in lower extremity) Tamala Julian, RN, Joelene Millin 04/02/2016 8:32:53 PM Disp. Time Eilene Ghazi Time) Disposition Final User 04/02/2016 8:36:23 PM Go to ED Now Yes Tamala Julian, RN, Joelene Millin PLEASE NOTE: All timestamps contained within this report are represented as [place] Standard Time. CONFIDENTIALTY NOTICE: This fax transmission is intended only for the addressee. It contains information that is legally privileged, confidential or otherwise protected from use or disclosure. If you are not the intended recipient, you are strictly prohibited from reviewing, disclosing, copying using or disseminating any of this information or taking any action in reliance on or regarding this information. If you have received this fax in error, please notify us immediately by telephone so that we can arrange for its return to [place]. Phone: [phone], Toll-Free: [phone], Fax: [phone] Page: 2 of 2 Call Id: [id number] Caller Understands: Yes Disagree/Comply: Comply Care Advice Given Per Guideline GO TO ED NOW: You need to be seen in the Emergency Department. Go to the ER at ___________ [place] now. Drive carefully. CARE ADVICE given per IV Site Symptoms (Adult) guideline. Referrals GO TO FACILITY UNDECIDED

## 2016-04-03 NOTE — Telephone Encounter (Signed)
Noted  

## 2016-04-03 NOTE — Telephone Encounter (Signed)
We have received notes and they have been placed on Dr. Dierdre Highman desks.

## 2016-04-06 ENCOUNTER — Ambulatory Visit (INDEPENDENT_AMBULATORY_CARE_PROVIDER_SITE_OTHER): Payer: BC Managed Care – PPO | Admitting: Family Medicine

## 2016-04-06 ENCOUNTER — Encounter: Payer: Self-pay | Admitting: Family Medicine

## 2016-04-06 VITALS — BP 134/78 | HR 83 | Temp 97.9°F | Resp 20 | Ht 62.0 in | Wt 112.2 lb

## 2016-04-06 DIAGNOSIS — N949 Unspecified condition associated with female genital organs and menstrual cycle: Secondary | ICD-10-CM | POA: Diagnosis not present

## 2016-04-06 DIAGNOSIS — R935 Abnormal findings on diagnostic imaging of other abdominal regions, including retroperitoneum: Secondary | ICD-10-CM

## 2016-04-06 DIAGNOSIS — N12 Tubulo-interstitial nephritis, not specified as acute or chronic: Secondary | ICD-10-CM

## 2016-04-06 DIAGNOSIS — Z87898 Personal history of other specified conditions: Secondary | ICD-10-CM

## 2016-04-06 DIAGNOSIS — Z9289 Personal history of other medical treatment: Secondary | ICD-10-CM | POA: Insufficient documentation

## 2016-04-06 DIAGNOSIS — N859 Noninflammatory disorder of uterus, unspecified: Secondary | ICD-10-CM | POA: Insufficient documentation

## 2016-04-06 NOTE — Patient Instructions (Signed)
Constipation: Take 1 cap of miralax 1-2 x a day with a full glass of water. If you start to have diarrhea taper off mirlax or cut back to half a cap a day.  I will order a [place] of your pelvis to followup on abnormal CT result. They will call you to set up.

## 2016-04-06 NOTE — Progress Notes (Signed)
Patient ID: Margaret Holmes, female   DOB: 02-21-1941, 75 y.o.   MRN: [id number]    Margaret Holmes , 1941/03/14, 75 y.o., female MRN: [id number]  CC: Recent hospitalization Subjective:  Patient presents for scheduled office visit secondary to her recent hospitalization through the [facility] health [facility]. Patient was admitted on 03/26/2016 with gram-negative sepsis secondary to pyelonephritis by Klebsiella pneumonia. Patient has had a chronic right renal hydronephrosis with stent placement. She is established with a urologist, Dr. Redmond Pulling. Patient states she had become very nauseous, was vomiting and had severe chills along with her right flank pain so she went to the emergency room. In the ED she had a temperature of 103F, WBC 15 with left shift and a lactic acid of 0.9. Urine was positive for nitrates, leuk trase and wbc's greater than 50 with a small amount of blood and 3+ bacteria. Patient was treated with 1 g of ceftriaxone, tobramycin and entrance and positioned into ciprofloxacin. Blood cultures and urine cultures were positive for gram negative rods with Klebsiella pneumonia. Patient had a CT scan of her abdomen showed a right renal heterogeneous enhancement and perinephric stranding consistent with pyelonephritis. Also noted was a left adnexal/ovarian cyst and endometrial lesion surrounding fluid concerning for polyp versus fibroid versus neoplasm. In comparison to her 2014 CT abdomen and our system, this would be a new finding. Patient reports she has followed up with her urologist, Dr. Redmond Pulling and reportedly had a normal urine study and exam. Patient states she's feeling much improved, denies any fevers, nausea, vomit. She does still endorse mild right flank pain and mid abdominal pain. She states this is not worsening.   Allergies  Allergen Reactions  . Codeine Other (See Comments)    Not specified in old records.  Carlton Adam [Propoxyphene N-Acetaminophen] Other (See Comments)    Not specified  in old records  . Hydrocodone Other (See Comments)    Not specified in old records   Social History  Substance Use Topics  . Smoking status: Never Smoker   . Smokeless tobacco: Never Used  . Alcohol Use: No   Past Medical History  Diagnosis Date  . Hyperlipidemia     per pt report 05/09/12; also on labs 06/2015.  Intolerant of pravachol 02/2016  . Hypothyroidism   . History of adenomatous polyp of colon 2003, 2006, 2013;2016    Repeat 2013 showed tubular adenoma x 3, with no high grade dysplasia.  2016 no polyps-recall 5 yrs  . H/O angular cheilitis     Zinc normal, vit B12 low normal (2012)  . H/O vitamin D deficiency 2011    Came back to normal range with replacement therapydd  . DDD (degenerative disc disease), cervical   . Mechanical back pain   . Lower leg pain     Bilateral, crampy--ABIs/dopplers NORMAL 04/2013.  . Adnexal cyst 07/2013    Left-3cm-noted on CT abd/pelv--f/u pelvic u/s did not show anything (L ovary not visualized, likely secondary to bowel gas)-repeat pelvic u/s after 07/2014.  . 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 Cholecystectomy  1980s  . Thyroidectomy, partial  1972  . Ganglion cyst excision  2003    Right wrist, with excision of some triquetrum spurs  . Wrist surgery  2006    For left thumb carpo-metacarpal arthritis  . Tonsillectomy and adenoidectomy  age 27  . Gastric banding port revision    .  Band hemorrhoidectomy    . Colonoscopy w/ biopsies and polypectomy  06/02/12;05/30/15    No polyps 2016-recall 5 yrs.  +internal hem.  Normal ileoscopy.   Family History  Problem Relation Age of Onset  . Cancer Mother     ovarian and throat cancer  . Diabetes Mother   . Alcohol abuse Father     alcoholism.  Died age 45  . Cancer Maternal Grandmother     breast cancer     Medication List       This list is accurate as of: 04/06/16 10:50 AM.  Always use your  most recent med list.               Acetaminophen 500 MG Tbdp  Take 1 tablet by mouth as needed.     cholecalciferol 1000 units tablet  Commonly known as:  VITAMIN D  Take 1,000 Units by mouth daily. Reported on 04/06/2016     ciprofloxacin 500 MG tablet  Commonly known as:  CIPRO  Take by mouth.     clindamycin 150 MG capsule  Commonly known as:  CLEOCIN  Take by mouth.     fluticasone 50 MCG/ACT nasal spray  Commonly known as:  FLONASE  1-2 sprays in each nostril qAM     levothyroxine 75 MCG tablet  Commonly known as:  SYNTHROID, LEVOTHROID  TAKE 1 TABLET DAILY     ondansetron 4 MG disintegrating tablet  Commonly known as:  ZOFRAN-ODT  Take by mouth. Reported on 04/06/2016     pantoprazole 40 MG tablet  Commonly known as:  PROTONIX  Take 1 tablet (40 mg total) by mouth daily.     pravastatin 40 MG tablet  Commonly known as:  PRAVACHOL  Take 1 tablet (40 mg total) by mouth daily.     traMADol 50 MG tablet  Commonly known as:  ULTRAM  Take by mouth. Reported on 04/06/2016        ROS: Negative, with the exception of above mentioned in HPI  Objective:  BP 134/78 mmHg  Pulse 83  Temp(Src) 97.9 F (36.6 C) (Oral)  Resp 20  Ht 5\' 2"  (1.575 m)  Wt 112 lb 4 oz (50.916 kg)  BMI 20.53 kg/m2  SpO2 97% Body mass index is 20.53 kg/(m^2). Gen: Afebrile. No acute distress. Thin, Caucasian female. HENT: AT. [place]. MMM, no oral lesions.  Eyes:Pupils Equal Round Reactive to light, Extraocular movements intact,  Conjunctiva without redness, discharge or icterus. CV: RRR Chest: CTAB, no wheeze or crackles. Good air movement, normal resp effort.  Abd: Soft. Flat. ND. Mild tenderness suprapubic/midline. BS present. No Masses palpated. No rebound or guarding.  MSK: No CVA tenderness bilaterally Skin: No rashes, purpura or petechiae.  Neuro:  Normal gait. PERLA. EOMi. Alert. Oriented x3  Psych: Normal affect, dress and demeanor. Normal speech. Normal thought content and  judgment.  Assessment/Plan: Margaret Holmes is a 75 y.o. female present for hospital follow-up OV for  Abnormal CT scan, pelvis/ Endometrial disorder - Discussed abnormal findings on CT that was completed during her hospitalization. Patient now aware of endometrial lesion as well as cyst. We'll proceed with recommended pelvic ultrasound to more completely evaluate endometrial lesion. Depending upon results patient will likely need GYN referral. - US Pelvis Complete; Future - US Transvaginal Non-OB; Future  History of recent hospitalization/Pyelonephritis - Patient was recently hospitalized for pyelonephritis due to Klebsiella pneumonia.  - She has had appropriate follow-up with her urologist. - She reports she is much  improved today with the exception of some mild discomfort over her lower mid abdomen.  > 25 minutes spent with patient, >50% of time spent face to face counseling patient and coordinating care.   Patient will be called with ultrasound results once available.  She is to follow-up routinely at her next scheduled appointment for her chronic medical issues.  electronically signed by:  Howard Pouch, DO  [place]

## 2016-04-09 ENCOUNTER — Other Ambulatory Visit (HOSPITAL_BASED_OUTPATIENT_CLINIC_OR_DEPARTMENT_OTHER): Payer: BC Managed Care – PPO

## 2016-04-09 ENCOUNTER — Inpatient Hospital Stay (HOSPITAL_BASED_OUTPATIENT_CLINIC_OR_DEPARTMENT_OTHER): Admission: RE | Admit: 2016-04-09 | Payer: BC Managed Care – PPO | Source: Ambulatory Visit

## 2016-04-10 ENCOUNTER — Ambulatory Visit (HOSPITAL_BASED_OUTPATIENT_CLINIC_OR_DEPARTMENT_OTHER)
Admission: RE | Admit: 2016-04-10 | Discharge: 2016-04-10 | Disposition: A | Discharge status: Home / Self Care | Payer: BC Managed Care – PPO | Source: Ambulatory Visit | Attending: Family Medicine | Admitting: Family Medicine

## 2016-04-10 ENCOUNTER — Telehealth: Payer: Self-pay | Admitting: Family Medicine

## 2016-04-10 DIAGNOSIS — N949 Unspecified condition associated with female genital organs and menstrual cycle: Secondary | ICD-10-CM

## 2016-04-10 DIAGNOSIS — R935 Abnormal findings on diagnostic imaging of other abdominal regions, including retroperitoneum: Secondary | ICD-10-CM

## 2016-04-10 DIAGNOSIS — N9489 Other specified conditions associated with female genital organs and menstrual cycle: Secondary | ICD-10-CM | POA: Insufficient documentation

## 2016-04-10 DIAGNOSIS — N859 Noninflammatory disorder of uterus, unspecified: Secondary | ICD-10-CM

## 2016-04-10 NOTE — Telephone Encounter (Signed)
Please call pt: - her [place] results showed the left ovarian cyst, which she knew about already and does not appear malignant.  - The [place] was unable to visualize her uterus in the detail we had hoped to get a better look at the abnormality noted on the CT.  - I have referred her to GYN so that can evaluate/treat- make recommendations.   US impression:  Uterus is poorly visualized but grossly unremarkable.  3.8 cm simple cyst in the left adnexa, likely benign. This is almost certainly benign, but follow up ultrasound is recommended in 1 year according to the Society of Radiologists in Ultrasound 2010 Consensus Conference Statement (D Clovis Riley et al. Management of Asymptomatic Ovarian and Other Adnexal Cysts Imaged at [place]: Society of Radiologists in [place] Statement 2010. Radiology 256 (Sept 2010): [id number].).  Right ovary is not discretely visualized.

## 2016-04-10 NOTE — Telephone Encounter (Signed)
Spoke with patient reviewed [place] results and information regarding referral. Patient verbalized understanding.

## 2016-04-12 ENCOUNTER — Encounter: Payer: Self-pay | Admitting: Obstetrics and Gynecology

## 2016-04-12 ENCOUNTER — Ambulatory Visit (INDEPENDENT_AMBULATORY_CARE_PROVIDER_SITE_OTHER): Payer: BC Managed Care – PPO | Admitting: Obstetrics and Gynecology

## 2016-04-12 VITALS — BP 124/70 | HR 64 | Resp 14 | Wt 108.0 lb

## 2016-04-12 DIAGNOSIS — N949 Unspecified condition associated with female genital organs and menstrual cycle: Secondary | ICD-10-CM | POA: Diagnosis not present

## 2016-04-12 DIAGNOSIS — N895 Stricture and atresia of vagina: Secondary | ICD-10-CM | POA: Diagnosis not present

## 2016-04-12 DIAGNOSIS — N952 Postmenopausal atrophic vaginitis: Secondary | ICD-10-CM

## 2016-04-12 DIAGNOSIS — N9489 Other specified conditions associated with female genital organs and menstrual cycle: Secondary | ICD-10-CM | POA: Diagnosis not present

## 2016-04-12 NOTE — Addendum Note (Signed)
Addended by: Dorothy Spark on: 04/12/2016 10:11 AM   Modules accepted: Orders

## 2016-04-12 NOTE — Progress Notes (Signed)
Patient ID: Margaret Holmes, female   DOB: [birth year], 75 y.o.   MRN: [id number] 75 y.o. [id number] MarriedCaucasianF here for a consult from Dr Howard Pouch regarding abnormal scans.   The patient was hospitalized recently with pyelonephritis. At the time of her hospitalization she had a CT scan that showed a left adnexal cyst and an "endometrial lesion with surrounding fluid, possible polyp, fibroid or neoplasm". She then had a f/u ultrasound, the uterus was not well visualize, but no abnormalities were noted in the uterus. She was noted to have a 3.8 cm simple left adnexal cyst. On review of her records she was noted to a have a 3 cm adnexal cyst in 10/14 on a CT scan. F/U ultrasound did not find a cyst.  She denies any vaginal bleeding. She c/o pain in her upper/mid pain and upper abdomen for the last few weeks. No lower abdominal pain. No fevers, nausea, emesis, diarrhea, constipation, or urinary c/o.  Not sexually active.     No LMP recorded. Patient is postmenopausal.          Sexually active: No.  The current method of family planning is post menopausal status.    Exercising: Yes.    walking Smoker:  no  Health Maintenance: Pap:  05/2015 WNL per patient History of abnormal Pap:  no MMG:  05/2015 WNL patient  Colonoscopy:  05/30/2015 polyps BMD:   08/30/14 WNL TDaP:  unsure Gardasil: N/A   reports that she has never smoked. She has never used smokeless tobacco. She reports that she does not drink alcohol or use illicit drugs.  She is a school bus driver   Past Medical History  Diagnosis Date  . Hyperlipidemia     per pt report 05/09/12; also on labs 06/2015.  Intolerant of pravachol 02/2016  . Hypothyroidism   . History of adenomatous polyp of colon 2003, 2006, 2013;2016    Repeat 2013 showed tubular adenoma x 3, with no high grade dysplasia.  2016 no polyps-recall 5 yrs  . H/O angular cheilitis     Zinc normal, vit B12 low normal (2012)  . H/O vitamin D deficiency 2011    Came back to  normal range with replacement therapydd  . DDD (degenerative disc disease), cervical   . Mechanical back pain   . Lower leg pain     Bilateral, crampy--ABIs/dopplers NORMAL 04/2013.  . Adnexal cyst 07/2013    Left-3cm-noted on CT abd/pelv--f/u pelvic u/s did not show anything (L ovary not visualized, likely secondary to bowel gas)-repeat pelvic u/s after 07/2014.  . 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 Cholecystectomy  1980s  . Thyroidectomy, partial  1972  . Ganglion cyst excision  2003    Right wrist, with excision of some triquetrum spurs  . Wrist surgery  2006    For left thumb carpo-metacarpal arthritis  . Tonsillectomy and adenoidectomy  age 33  . Band hemorrhoidectomy    . Colonoscopy w/ biopsies and polypectomy  06/02/12;05/30/15    No polyps 2016-recall 5 yrs.  +internal hem.  Normal ileoscopy.  . Tubal ligation      Current Outpatient Prescriptions  Medication Sig Dispense Refill  . Acetaminophen 500 MG TBDP Take 1 tablet by mouth as needed.    . cholecalciferol (VITAMIN D) 1000 UNITS tablet Take 1,000 Units by mouth daily. Reported on 04/06/2016    . fluticasone (FLONASE) 50  MCG/ACT nasal spray 1-2 sprays in each nostril qAM 16 g 3  . levothyroxine (SYNTHROID, LEVOTHROID) 75 MCG tablet TAKE 1 TABLET DAILY 90 tablet 3  . pantoprazole (PROTONIX) 40 MG tablet Take 1 tablet (40 mg total) by mouth daily. 90 tablet 3  . pravastatin (PRAVACHOL) 40 MG tablet Take 1 tablet (40 mg total) by mouth daily. 90 tablet 1  . ondansetron (ZOFRAN-ODT) 4 MG disintegrating tablet Take by mouth. Reported on 04/12/2016    . traMADol (ULTRAM) 50 MG tablet Take by mouth. Reported on 04/12/2016     No current facility-administered medications for this visit.    Family History  Problem Relation Age of Onset  . Cancer Mother     ovarian and throat cancer  . Diabetes Mother   . Alcohol abuse Father      alcoholism.  Died age 77  . Cancer Maternal Grandmother     breast cancer    Review of Systems  Constitutional: Negative.   HENT: Negative.   Eyes: Negative.   Respiratory: Negative.   Cardiovascular: Negative.   Gastrointestinal: Positive for abdominal pain.  Endocrine: Negative.   Genitourinary: Negative.   Musculoskeletal: Negative.   Skin: Negative.   Allergic/Immunologic: Negative.   Neurological: Negative.   Psychiatric/Behavioral: Negative.     Exam:   BP 124/70 mmHg  Pulse 64  Resp 14  Wt 108 lb (48.988 kg)  Weight change: @WEIGHTCHANGE @ Height:      Ht Readings from Last 3 Encounters:  04/06/16 5\' 2"  (1.575 m)  02/07/16 5\' 2"  (1.575 m)  01/18/16 5\' 2"  (1.575 m)    General appearance: alert, cooperative and appears stated age Head: Normocephalic, without obvious abnormality, atraumatic Neck: no adenopathy, supple, symmetrical, trachea midline and thyroid thyroid only palpated on the right Lungs: clear to auscultation bilaterally Heart: regular rate and rhythm Abdomen: soft, non-tender; bowel sounds normal; no masses,  no organomegaly Extremities: extremities normal, atraumatic, no cyanosis or edema Skin: Skin color, texture, turgor normal. No rashes or lesions Lymph nodes: Cervical, supraclavicular, and axillary nodes normal. No abnormal inguinal nodes palpated Neurologic: Grossly normal   Pelvic: External genitalia:  no lesions              Urethra:  normal appearing urethra with no masses, tenderness or lesions              Bartholins and Skenes: normal                 Vagina: very atrophic vagina. At the top of her vagina, her vagina goes towards her right and is very narrow, limiting evaluation              Cervix: unable to see her cervix, could feel a small portion of cervix on exam               Bimanual Exam:  Uterus:  normal size, contour, position, consistency, mobility, non-tender              Adnexa: no mass, fullness, tenderness                Rectovaginal: Confirms               Anus:  normal sphincter tone, no lesions  Chaperone was present for exam.  A:   3.8 cm left adnexal cyst. I reviewed the images from her recent ultrasound, benign appearance. She was noted to have a 3 cm adnexal cyst on CT scan in 2014, it was  not seen on ultrasound at that time.  Endometrial abnormalitiy noted on CT, not seen on recent ultrasound, but suboptimal visualization of the uterus Vaginal atrophy and scarring  P:    CA 125 F/U ultrasound in 3 months to access her adnexal cyst Will review the CT results, if she has fluid in her cavity and more than 3 mm of thickness would recommend examination in the OR with attempt hysteroscopy, D&C under ultrasound guidance Would want to pre-treat with vaginal estrogen given scaring at the top of her vagina and cytotec  CC: Dr Howard Pouch Letter sent

## 2016-04-13 LAB — CA 125: CA 125: 29 U/mL (ref <35)

## 2016-04-18 ENCOUNTER — Telehealth: Payer: Self-pay | Admitting: Obstetrics and Gynecology

## 2016-04-18 NOTE — Telephone Encounter (Signed)
I called radiology to discuss the patient's CT scan and endometrial abnormality. I spoke with Dr Bjorn Loser, he reviewed her films and thinks she has a 1.5 cm endometrial polyp. On ultrasound the uterus was not well visualized.  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 I told her I would discuss the CT with radiology and get back to her. We discussed possible hysteroscopy, D&C.  I would recommend that we set her up for a hysteroscopy, polypectomy, D&C under ultrasound guidance in about 1 month. I would treat her with vaginal estrogen, premarin vaginal cream 0.5 grams per vagina qhs x 1 week, then 3 x a week to try and help with vaginal stenosis and cervical visualization. She should also have a f/u ultrasound for her ovarian cyst in 3 months (normal CA 125). I called her home # and left a message for her to call back and speak with Gay Filler.  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

## 2016-04-19 MED ORDER — ESTROGENS, CONJUGATED 0.625 MG/GM VA CREA: TOPICAL_CREAM | VAGINAL | Status: DC

## 2016-04-19 NOTE — Telephone Encounter (Signed)
Patient is calling to talk with Gay Filler per Dr.Jertson's message.

## 2016-04-19 NOTE — Telephone Encounter (Signed)
Spoke to patient. Reviewed results and instructions from Dr Talbert Nan. Patient agreeable to plan. Requests surgery to be scheduled week of 05-28-16. Instructed me to call back and leave scheduling information on voice mail and requests written note be mailed so she can explain to employer since it is difficult for her to remember all of this information. Instructions for premarin vaginal cream nightly for one week then three times week until surgery. Will also schedule follow-up ultrasound in 3 months.    Encounter closed.

## 2016-05-07 ENCOUNTER — Telehealth: Payer: Self-pay | Admitting: *Deleted

## 2016-05-07 ENCOUNTER — Ambulatory Visit (INDEPENDENT_AMBULATORY_CARE_PROVIDER_SITE_OTHER): Payer: BC Managed Care – PPO | Admitting: Family Medicine

## 2016-05-07 ENCOUNTER — Encounter: Payer: Self-pay | Admitting: Family Medicine

## 2016-05-07 VITALS — BP 96/59 | HR 69 | Temp 97.9°F | Resp 18 | Ht 62.0 in | Wt 107.8 lb

## 2016-05-07 DIAGNOSIS — S71131A Puncture wound without foreign body, right thigh, initial encounter: Secondary | ICD-10-CM | POA: Diagnosis not present

## 2016-05-07 DIAGNOSIS — R58 Hemorrhage, not elsewhere classified: Secondary | ICD-10-CM | POA: Diagnosis not present

## 2016-05-07 DIAGNOSIS — Z23 Encounter for immunization: Secondary | ICD-10-CM | POA: Diagnosis not present

## 2016-05-07 DIAGNOSIS — S71139A Puncture wound without foreign body, unspecified thigh, initial encounter: Secondary | ICD-10-CM | POA: Insufficient documentation

## 2016-05-07 LAB — PROTIME-INR
INR: 1.1 ratio — AB (ref 0.8–1.0)
PROTHROMBIN TIME: 11.1 s (ref 9.6–13.1)

## 2016-05-07 LAB — APTT: aPTT: 33.5 s — ABNORMAL HIGH (ref 23.4–32.7)

## 2016-05-07 MED ORDER — DOXYCYCLINE HYCLATE 100 MG PO TABS: 100.0000 mg | ORAL_TABLET | Freq: Two times a day (BID) | ORAL | Status: DC

## 2016-05-07 NOTE — Telephone Encounter (Signed)
Patient called. States she wants to know about her surgery appointment. Needs to know when it is going to be since her husband is having surgery soon also.

## 2016-05-07 NOTE — Telephone Encounter (Signed)
Return call to patient. Per DPR, ok to leave message on home number. Left message that "scheduled for 05-28-16 as previously requested. Call back tomorrow and will review instructions." Surgery is schedule for 05-28-16 at 1030 at [facility]

## 2016-05-07 NOTE — Progress Notes (Signed)
Patient ID: Margaret Holmes, female   DOB: 12/03/1940, 75 y.o.   MRN: [id number]    Margaret Holmes , May 13, 1941, 75 y.o., female MRN: [id number] Patient Care Team    Relationship Specialty Notifications Start End  [name], DO PCP - General Family Medicine  04/09/16   [name], NP Nurse Practitioner Obstetrics and Gynecology  02/15/16     CC:  Subjective: Pt presents for an acute OV with complaints of puncture wound of 1 week duration.  Associated symptoms include tenderness. She was cutting limbs down and was poked in the leg by a large very sharp  limb. She endorses redness around the area and tenderness She is uncertain when her last tetanus was given and is concerned she needs it. She states  it has been over 5 years and was the tdap. She denies fever or chills. Patient states she has been easily bleeding since her hospital stay last month. She denies daily use of NSAIDS, goody powder or blood thinners.   Allergies  Allergen Reactions  . Codeine Other (See Comments)    Not specified in old records.  Carlton Adam [Propoxyphene N-Acetaminophen] Other (See Comments)    Not specified in old records  . Hydrocodone Other (See Comments)    Not specified in old records   Social History  Substance Use Topics  . Smoking status: Never Smoker   . Smokeless tobacco: Never Used  . Alcohol Use: No   Past Medical History  Diagnosis Date  . Hyperlipidemia     per pt report 05/09/12; also on labs 06/2015.  Intolerant of pravachol 02/2016  . Hypothyroidism   . History of adenomatous polyp of colon 2003, 2006, 2013;2016    Repeat 2013 showed tubular adenoma x 3, with no high grade dysplasia.  2016 no polyps-recall 5 yrs  . H/O angular cheilitis     Zinc normal, vit B12 low normal (2012)  . H/O vitamin D deficiency 2011    Came back to normal range with replacement therapydd  . DDD (degenerative disc disease), cervical   . Mechanical back pain   . Lower leg pain     Bilateral,  crampy--ABIs/dopplers NORMAL 04/2013.  . Adnexal cyst 07/2013    Left-3cm-noted on CT abd/pelv--f/u pelvic u/s did not show anything (L ovary not visualized, likely secondary to bowel gas)-repeat pelvic u/s after 07/2014.  . 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 Cholecystectomy  1980s  . Thyroidectomy, partial  1972  . Ganglion cyst excision  2003    Right wrist, with excision of some triquetrum spurs  . Wrist surgery  2006    For left thumb carpo-metacarpal arthritis  . Tonsillectomy and adenoidectomy  age 40  . Band hemorrhoidectomy    . Colonoscopy w/ biopsies and polypectomy  06/02/12;05/30/15    No polyps 2016-recall 5 yrs.  +internal hem.  Normal ileoscopy.  . Tubal ligation     Family History  Problem Relation Age of Onset  . Cancer Mother     ovarian and throat cancer  . Diabetes Mother   . Alcohol abuse Father     alcoholism.  Died age 52  . Cancer Maternal Grandmother     breast cancer     Medication List       This list is accurate as of: 05/07/16  1:34 PM.  Always use your most recent med list.  Acetaminophen 500 MG Tbdp  Take 1 tablet by mouth as needed.     ALLERGY EYE 0.025-0.3 % ophthalmic solution  Generic drug:  naphazoline-pheniramine     cholecalciferol 1000 units tablet  Commonly known as:  VITAMIN D  Take 1,000 Units by mouth daily. Reported on 04/06/2016     conjugated estrogens vaginal cream  Commonly known as:  PREMARIN  Insert 0.5 gram vaginally every night for one week then 3 times per week.     fluticasone 50 MCG/ACT nasal spray  Commonly known as:  FLONASE  1-2 sprays in each nostril qAM     levothyroxine 75 MCG tablet  Commonly known as:  SYNTHROID, LEVOTHROID  TAKE 1 TABLET DAILY     ondansetron 4 MG disintegrating tablet  Commonly known as:  ZOFRAN-ODT  Take by mouth. Reported on 05/07/2016     pantoprazole 40 MG tablet    Commonly known as:  PROTONIX  Take 1 tablet (40 mg total) by mouth daily.     pravastatin 40 MG tablet  Commonly known as:  PRAVACHOL  Take 1 tablet (40 mg total) by mouth daily.        No results found for this or any previous visit (from the past 24 hour(s)). No results found.   ROS: Negative, with the exception of above mentioned in HPI   Objective:  BP 96/59 mmHg  Pulse 69  Temp(Src) 97.9 F (36.6 C)  Resp 18  Ht 5\' 2"  (1.575 m)  Wt 107 lb 12.8 oz (48.898 kg)  BMI 19.71 kg/m2  SpO2 95% Body mass index is 19.71 kg/(m^2). Gen: Afebrile. No acute distress. Nontoxic in appearance, well developed, well nourished.  HENT: AT. [place]. MMM, no oral lesions.  Eyes:Pupils Equal Round Reactive to light, Extraocular movements intact,  Conjunctiva without redness, discharge or icterus. Skin: ~ 4 cm puncture wound right anterior thigh, mild drainage, erythema surrounding, raised darken center. Tenderness present. No bleeding.  No fluctuance. No rashes, purpura or petechiae.  Neuro:  Normal gait. PERLA. EOMi. Alert. Oriented x3  Psych: Normal affect, dress and demeanor. Normal speech. Normal thought content and judgment.  Assessment/Plan: [name] is a 75 y.o. female present for acute OV for  1. Puncture wound of thigh, right, initial encounter - mild cellulitis, no abscess noted. Center of wound is dark, investigated with magnifier, I believe this is old blood and not FB. Pt reports bleeding more easily, so will hold on exploration at this time. Will treat for infection and follow in 10 days. Will consider exploration at that time if needed. - Td : Tetanus/diphtheria >7yo Preservative  free - doxycycline (VIBRA-TABS) 100 MG tablet; Take 1 tablet (100 mg total) by mouth 2 (two) times daily.  Dispense: 10 tablet; Refill: 0  2. Bleeding; easy - INR/PT - PTT - Avoid nsaids.    F/U 10 days if no improvement or sooner if worsening.    electronically signed by:  Howard Pouch,  DO  [place]

## 2016-05-07 NOTE — Patient Instructions (Signed)
We have updated your tetanus shot today secondary to your puncture wound.  I have called in antibiotic for you to take for 5 days for your leg.  If not healing well, becomes more red, swollen or pain, please be seen sooner. Otherwise follow up in 10 days.

## 2016-05-08 ENCOUNTER — Telehealth: Payer: Self-pay | Admitting: Family Medicine

## 2016-05-08 NOTE — Telephone Encounter (Signed)
Spoke with patient reviewed lab results and instructions. Patient verbalized understanding. 

## 2016-05-08 NOTE — Telephone Encounter (Signed)
Please call pt: - Her labs test for her report of increased bleeding are basically normal, there is a very mild elevation and may just be a transition from her recent hospitalization/illness. I would have her avoid NSAIDS/ASA and if just notices any worsening symptoms (abnormal bleeding, bloody nose, rectal bleeding, bruising etc) she should be seen immediately.

## 2016-05-08 NOTE — Telephone Encounter (Signed)
Patient returned call

## 2016-05-08 NOTE — Telephone Encounter (Signed)
Call back to patient. Advised/confirmed surgery date of 05-28-16 at 34 at [facility]. Arrive at 0900 unless hospital directs otherwise. Surgery instruction sheet reviewed and printed copy mailed to patient, see copy scanned to chart.  Routing to provider for final review. Patient agreeable to disposition. Will close encounter.

## 2016-05-14 ENCOUNTER — Telehealth: Payer: Self-pay | Admitting: Obstetrics and Gynecology

## 2016-05-14 MED ORDER — MISOPROSTOL 200 MCG PO TABS: ORAL_TABLET | ORAL | Status: DC

## 2016-05-14 MED ORDER — ESTRADIOL 10 MCG VA TABS: ORAL_TABLET | VAGINAL | Status: DC

## 2016-05-14 NOTE — Telephone Encounter (Signed)
Spoke with patient. Advised of message as seen below from [place]. She is agreeable and verbalizes understanding. Rx for Vagifem 10 mcg, place 1 tablet qhs until surgery #16 0 RF and Cytotec 200 mcg, place 2 tablets vaginally the night before surgery #2 0RF sent to pharmacy on file. She is agreeable.  Routing to provider for final review. Patient agreeable to disposition. Will close encounter.

## 2016-05-14 NOTE — Telephone Encounter (Signed)
I know the patient has a pre-op tomorrow. But can you please send in vagifem, 1 tablet qhs until her surgery. #16, no refills. I would like her to start tonight if possible.  Also please call in cytotec 200 mcg, place 2 tablets vaginal the night prior to surgery.

## 2016-05-15 ENCOUNTER — Encounter: Payer: Self-pay | Admitting: Obstetrics and Gynecology

## 2016-05-15 ENCOUNTER — Ambulatory Visit (INDEPENDENT_AMBULATORY_CARE_PROVIDER_SITE_OTHER): Payer: BC Managed Care – PPO | Admitting: Obstetrics and Gynecology

## 2016-05-15 VITALS — BP 100/60 | HR 64 | Resp 16 | Ht 62.0 in | Wt 108.0 lb

## 2016-05-15 DIAGNOSIS — N9489 Other specified conditions associated with female genital organs and menstrual cycle: Secondary | ICD-10-CM

## 2016-05-15 NOTE — Progress Notes (Signed)
GYNECOLOGY  VISIT   HPI: 75 y.o.   Married  Caucasian  female   [phone] with No LMP recorded. Patient is postmenopausal.   here for Pre Op 05/28/16  DILATATION & CURETTAGE/HYSTEROSCOPY WITH MYOSURE with ULTRASOUND guidance The patient was incidentally noted to have a uterine mass on CT scan. U/S visualization was poor. Per the radiologist, she appears to have a 1.5 cm endometrial polyp. She also has significant vaginal atrophy with adhesions in her upper vagina. She has been using premarin cream for the last several weeks. No vaginal bleeding.   GYNECOLOGIC HISTORY: No LMP recorded. Patient is postmenopausal. Contraception: post menopausal  Menopausal hormone therapy: Premarin vaginal cream        OB History    Gravida Para Term Preterm AB TAB SAB Ectopic Multiple Living   3 3 3       3          Patient Active Problem List   Diagnosis Date Noted  . Puncture wound of thigh 05/07/2016  . Bleeding 05/07/2016  . Abnormal CT scan, pelvis 04/06/2016  . History of recent hospitalization 04/06/2016  . Endometrial disorder 04/06/2016  . Pyelonephritis 04/06/2016  . Hyperlipidemia 02/07/2016  . Hypothyroidism 05/11/2012    Past Medical History  Diagnosis Date  . Hyperlipidemia     per pt report 05/09/12; also on labs 06/2015.  Intolerant of pravachol 02/2016  . Hypothyroidism   . History of adenomatous polyp of colon 2003, 2006, 2013;2016    Repeat 2013 showed tubular adenoma x 3, with no high grade dysplasia.  2016 no polyps-recall 5 yrs  . H/O angular cheilitis     Zinc normal, vit B12 low normal (2012)  . H/O vitamin D deficiency 2011    Came back to normal range with replacement therapydd  . DDD (degenerative disc disease), cervical   . Mechanical back pain   . Lower leg pain     Bilateral, crampy--ABIs/dopplers NORMAL 04/2013.  . Adnexal cyst 07/2013    Left-3cm-noted on CT abd/pelv--f/u pelvic u/s did not show anything (L ovary not visualized, likely secondary to bowel  gas)-repeat pelvic u/s after 07/2014.  . 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 Cholecystectomy  1980s  . Thyroidectomy, partial  1972  . Ganglion cyst excision  2003    Right wrist, with excision of some triquetrum spurs  . Wrist surgery  2006    For left thumb carpo-metacarpal arthritis  . Tonsillectomy and adenoidectomy  age 44  . Band hemorrhoidectomy    . Colonoscopy w/ biopsies and polypectomy  06/02/12;05/30/15    No polyps 2016-recall 5 yrs.  +internal hem.  Normal ileoscopy.  . Tubal ligation      Current Outpatient Prescriptions  Medication Sig Dispense Refill  . conjugated estrogens (PREMARIN) vaginal cream Insert 0.5 gram vaginally every night for one week then 3 times per week. 42.5 g 1  . levothyroxine (SYNTHROID, LEVOTHROID) 75 MCG tablet TAKE 1 TABLET DAILY 90 tablet 3  . misoprostol (CYTOTEC) 200 MCG tablet Place 2 tablets vaginally the night before surgery. 2 tablet 0  . fluticasone (FLONASE) 50 MCG/ACT nasal spray 1-2 sprays in each nostril qAM (Patient not taking: Reported on 05/15/2016) 16 g 3  . ondansetron (ZOFRAN-ODT) 4 MG disintegrating tablet Reported on 05/15/2016    . pantoprazole (PROTONIX) 40 MG tablet Take 1 tablet (40 mg total) by mouth daily. (Patient  not taking: Reported on 05/15/2016) 90 tablet 3  . pravastatin (PRAVACHOL) 40 MG tablet Take 1 tablet (40 mg total) by mouth daily. (Patient not taking: Reported on 05/07/2016) 90 tablet 1  . PROAIR HFA 108 (90 Base) MCG/ACT inhaler Inhale 1-2 puffs into the lungs every 6 (six) hours as needed. Reported on 05/15/2016     No current facility-administered medications for this visit.   Not currently taking the protonix or the pravachol  ALLERGIES: Codeine; Darvocet; Hydrocodone; and Valium  Family History  Problem Relation Age of Onset  . Cancer Mother     ovarian and throat cancer  . Diabetes Mother   . Alcohol  abuse Father     alcoholism.  Died age 36  . Cancer Maternal Grandmother     breast cancer    Social History   Social History  . Marital Status: Married    Spouse Name: N/A  . Number of Children: N/A  . Years of Education: N/A   Occupational History  . Not on file.   Social History Main Topics  . Smoking status: Never Smoker   . Smokeless tobacco: Never Used  . Alcohol Use: No  . Drug Use: No  . Sexual Activity: No   Other Topics Concern  . Not on file   Social History Narrative   Married, 3 children, 6 grandchildren, 1 GGc.   Orig from this region.  Drives a school bus (X 31 yrs).   No T/A/Ds.   Caffeine: one cup coffee a day, 3 glasses of sweet tea per day.   No formal exercise.  She is active (NOT sedentary).                Review of Systems  Constitutional: Negative.   HENT: Negative.   Eyes: Negative.   Respiratory: Negative.   Cardiovascular: Negative.   Gastrointestinal: Negative.   Genitourinary: Negative.   Musculoskeletal: Negative.   Skin: Negative.   Neurological: Negative.   Endo/Heme/Allergies: Negative.   Psychiatric/Behavioral: Negative.     PHYSICAL EXAMINATION:    BP 100/60 mmHg  Pulse 64  Resp 16  Ht 5\' 2"  (1.575 m)  Wt 108 lb (48.988 kg)  BMI 19.75 kg/m2    General appearance: alert, cooperative and appears stated age Neck: no adenopathy, supple, symmetrical, trachea midline and thyroid normal to inspection and palpation Heart: regular rate and rhythm Lungs: CTAB Abdomen: soft, non-tender; bowel sounds normal; no masses,  no organomegaly Lungs: CTAB Extremities: normal, atraumatic, no cyanosis Skin: normal color, texture and turgor, no rashes or lesions Lymph: normal cervical supraclavicular and inguinal nodes Neurologic: grossly normal    ASSESSMENT Incidental finding of an endometrial mass on CT, not well visualized on ultrasound. Per radiology they suspect a 1.5 cm polyp Vaginal atrophy and scarring, limiting office  evaluation    PLAN The patient is being treated with vaginal estrogen to hopefully help with her vaginal scarring so her cervix can be visualized Plan: hysteroscopy, possible polypectomy, dilation and curettage under ultrasound guidance. Reviewed risks, including: bleeding, infection, uterine perforation, fluid overload, need for further surgery. Also discussed the possibility that I wouldn't be able to complete the procedure.     An After Visit Summary was printed and given to the patient.    CC: Dr Howard Pouch

## 2016-05-15 NOTE — H&P (Signed)
GYNECOLOGY  VISIT   HPI: 75 y.o.   Married  Caucasian  female   [phone] with No LMP recorded. Patient is postmenopausal.   here for Pre Op 05/28/16  DILATATION & CURETTAGE/HYSTEROSCOPY WITH MYOSURE with ULTRASOUND guidance The patient was incidentally noted to have a uterine mass on CT scan. U/S visualization was poor. Per the radiologist, she appears to have a 1.5 cm endometrial polyp. She also has significant vaginal atrophy with adhesions in her upper vagina. She has been using premarin cream for the last several weeks. No vaginal bleeding.   GYNECOLOGIC HISTORY: No LMP recorded. Patient is postmenopausal. Contraception: post menopausal  Menopausal hormone therapy: Premarin vaginal cream        OB History    Gravida Para Term Preterm AB TAB SAB Ectopic Multiple Living   3 3 3       3          Patient Active Problem List   Diagnosis Date Noted  . Puncture wound of thigh 05/07/2016  . Bleeding 05/07/2016  . Abnormal CT scan, pelvis 04/06/2016  . History of recent hospitalization 04/06/2016  . Endometrial disorder 04/06/2016  . Pyelonephritis 04/06/2016  . Hyperlipidemia 02/07/2016  . Hypothyroidism 05/11/2012    Past Medical History  Diagnosis Date  . Hyperlipidemia     per pt report 05/09/12; also on labs 06/2015.  Intolerant of pravachol 02/2016  . Hypothyroidism   . History of adenomatous polyp of colon 2003, 2006, 2013;2016    Repeat 2013 showed tubular adenoma x 3, with no high grade dysplasia.  2016 no polyps-recall 5 yrs  . H/O angular cheilitis     Zinc normal, vit B12 low normal (2012)  . H/O vitamin D deficiency 2011    Came back to normal range with replacement therapydd  . DDD (degenerative disc disease), cervical   . Mechanical back pain   . Lower leg pain     Bilateral, crampy--ABIs/dopplers NORMAL 04/2013.  . Adnexal cyst 07/2013    Left-3cm-noted on CT abd/pelv--f/u pelvic u/s did not show anything (L ovary not visualized, likely secondary to bowel  gas)-repeat pelvic u/s after 07/2014.  . 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 Cholecystectomy  1980s  . Thyroidectomy, partial  1972  . Ganglion cyst excision  2003    Right wrist, with excision of some triquetrum spurs  . Wrist surgery  2006    For left thumb carpo-metacarpal arthritis  . Tonsillectomy and adenoidectomy  age 42  . Band hemorrhoidectomy    . Colonoscopy w/ biopsies and polypectomy  06/02/12;05/30/15    No polyps 2016-recall 5 yrs.  +internal hem.  Normal ileoscopy.  . Tubal ligation      Current Outpatient Prescriptions  Medication Sig Dispense Refill  . conjugated estrogens (PREMARIN) vaginal cream Insert 0.5 gram vaginally every night for one week then 3 times per week. 42.5 g 1  . levothyroxine (SYNTHROID, LEVOTHROID) 75 MCG tablet TAKE 1 TABLET DAILY 90 tablet 3  . misoprostol (CYTOTEC) 200 MCG tablet Place 2 tablets vaginally the night before surgery. 2 tablet 0  . fluticasone (FLONASE) 50 MCG/ACT nasal spray 1-2 sprays in each nostril qAM (Patient not taking: Reported on 05/15/2016) 16 g 3  . ondansetron (ZOFRAN-ODT) 4 MG disintegrating tablet Reported on 05/15/2016    . pantoprazole (PROTONIX) 40 MG tablet Take 1 tablet (40 mg total) by mouth daily. (Patient  not taking: Reported on 05/15/2016) 90 tablet 3  . pravastatin (PRAVACHOL) 40 MG tablet Take 1 tablet (40 mg total) by mouth daily. (Patient not taking: Reported on 05/07/2016) 90 tablet 1  . PROAIR HFA 108 (90 Base) MCG/ACT inhaler Inhale 1-2 puffs into the lungs every 6 (six) hours as needed. Reported on 05/15/2016     No current facility-administered medications for this visit.   Not currently taking the protonix or the pravachol  ALLERGIES: Codeine; Darvocet; Hydrocodone; and Valium  Family History  Problem Relation Age of Onset  . Cancer Mother     ovarian and throat cancer  . Diabetes Mother   . Alcohol  abuse Father     alcoholism.  Died age 13  . Cancer Maternal Grandmother     breast cancer    Social History   Social History  . Marital Status: Married    Spouse Name: N/A  . Number of Children: N/A  . Years of Education: N/A   Occupational History  . Not on file.   Social History Main Topics  . Smoking status: Never Smoker   . Smokeless tobacco: Never Used  . Alcohol Use: No  . Drug Use: No  . Sexual Activity: No   Other Topics Concern  . Not on file   Social History Narrative   Married, 3 children, 6 grandchildren, 1 GGc.   Orig from this region.  Drives a school bus (X 31 yrs).   No T/A/Ds.   Caffeine: one cup coffee a day, 3 glasses of sweet tea per day.   No formal exercise.  She is active (NOT sedentary).                Review of Systems  Constitutional: Negative.   HENT: Negative.   Eyes: Negative.   Respiratory: Negative.   Cardiovascular: Negative.   Gastrointestinal: Negative.   Genitourinary: Negative.   Musculoskeletal: Negative.   Skin: Negative.   Neurological: Negative.   Endo/Heme/Allergies: Negative.   Psychiatric/Behavioral: Negative.     PHYSICAL EXAMINATION:    BP 100/60 mmHg  Pulse 64  Resp 16  Ht 5\' 2"  (1.575 m)  Wt 108 lb (48.988 kg)  BMI 19.75 kg/m2    General appearance: alert, cooperative and appears stated age Neck: no adenopathy, supple, symmetrical, trachea midline and thyroid normal to inspection and palpation Heart: regular rate and rhythm Lungs: CTAB Abdomen: soft, non-tender; bowel sounds normal; no masses,  no organomegaly Lungs: CTAB Extremities: normal, atraumatic, no cyanosis Skin: normal color, texture and turgor, no rashes or lesions Lymph: normal cervical supraclavicular and inguinal nodes Neurologic: grossly normal    ASSESSMENT Incidental finding of an endometrial mass on CT, not well visualized on ultrasound. Per radiology they suspect a 1.5 cm polyp Vaginal atrophy and scarring, limiting office  evaluation    PLAN The patient is being treated with vaginal estrogen to hopefully help with her vaginal scarring so her cervix can be visualized Plan: hysteroscopy, possible polypectomy, dilation and curettage under ultrasound guidance. Reviewed risks, including: bleeding, infection, uterine perforation, fluid overload, need for further surgery. Also discussed the possibility that I wouldn't be able to complete the procedure.     An After Visit Summary was printed and given to the patient.

## 2016-05-16 ENCOUNTER — Encounter: Payer: Self-pay | Admitting: Obstetrics and Gynecology

## 2016-05-16 NOTE — Patient Instructions (Signed)
Your procedure is scheduled on:  Monday, May 28, 2016  Enter through the Main Entrance of [facility] at:  9:00 AM  Pick up the phone at the desk and dial [phone].  Call this number if you have problems the morning of surgery: [phone].  Remember: Do NOT eat food or drink after:  Midnight Sunday  Take these medicines the morning of surgery with a SIP OF WATER:  Levothyroxine  Bring asthma inhaler day of surgery  Do NOT wear jewelry (body piercing), metal hair clips/bobby pins, make-up, or nail polish. Do NOT wear lotions, powders, or perfumes.  You may wear deodorant. Do NOT shave for 48 hours prior to surgery. Do NOT bring valuables to the hospital. Contacts, dentures, or bridgework may not be worn into surgery.  Have a responsible adult drive you home and stay with you for 24 hours after your procedure

## 2016-05-17 ENCOUNTER — Encounter (HOSPITAL_COMMUNITY)
Admission: RE | Admit: 2016-05-17 | Discharge: 2016-05-17 | Disposition: A | Discharge status: Home / Self Care | Payer: BC Managed Care – PPO | Source: Ambulatory Visit | Attending: Obstetrics and Gynecology | Admitting: Obstetrics and Gynecology

## 2016-05-17 ENCOUNTER — Encounter (HOSPITAL_COMMUNITY): Payer: Self-pay

## 2016-05-17 ENCOUNTER — Other Ambulatory Visit: Payer: Self-pay

## 2016-05-17 DIAGNOSIS — Z01812 Encounter for preprocedural laboratory examination: Secondary | ICD-10-CM | POA: Diagnosis not present

## 2016-05-17 DIAGNOSIS — Z01810 Encounter for preprocedural cardiovascular examination: Secondary | ICD-10-CM | POA: Diagnosis present

## 2016-05-17 HISTORY — DX: Renal tubulo-interstitial disease, unspecified: N15.9

## 2016-05-17 HISTORY — DX: Unspecified asthma, uncomplicated: J45.909

## 2016-05-17 HISTORY — DX: Reserved for inherently not codable concepts without codable children: IMO0001

## 2016-05-17 HISTORY — DX: Anemia, unspecified: D64.9

## 2016-05-17 HISTORY — DX: Personal history of other diseases of the digestive system: Z87.19

## 2016-05-17 HISTORY — DX: Other specified postprocedural states: Z98.890

## 2016-05-17 HISTORY — DX: Nausea with vomiting, unspecified: R11.2

## 2016-05-17 LAB — CBC
HEMATOCRIT: 36 % (ref 36.0–46.0)
HEMOGLOBIN: 11.5 g/dL — AB (ref 12.0–15.0)
MCH: 28.8 pg (ref 26.0–34.0)
MCHC: 31.9 g/dL (ref 30.0–36.0)
MCV: 90.2 fL (ref 78.0–100.0)
PLATELETS: 230 10*3/uL (ref 150–400)
RBC: 3.99 MIL/uL (ref 3.87–5.11)
RDW: 15.1 % (ref 11.5–15.5)
WBC: 6 10*3/uL (ref 4.0–10.5)

## 2016-05-22 ENCOUNTER — Telehealth: Payer: Self-pay | Admitting: Family Medicine

## 2016-05-22 NOTE — Telephone Encounter (Signed)
Scheduled an appt for patient ° °

## 2016-05-22 NOTE — Telephone Encounter (Signed)
Patient is requesting a CB. She is still having a lot of fatigue & was told by another physician that she was anemic & to contact her PCP.

## 2016-05-23 ENCOUNTER — Ambulatory Visit (INDEPENDENT_AMBULATORY_CARE_PROVIDER_SITE_OTHER): Payer: BC Managed Care – PPO | Admitting: Family Medicine

## 2016-05-23 ENCOUNTER — Encounter: Payer: Self-pay | Admitting: Family Medicine

## 2016-05-23 VITALS — BP 126/70 | HR 54 | Temp 98.0°F | Resp 18 | Ht 62.0 in | Wt 108.8 lb

## 2016-05-23 DIAGNOSIS — D649 Anemia, unspecified: Secondary | ICD-10-CM | POA: Diagnosis not present

## 2016-05-23 NOTE — Patient Instructions (Signed)
After your surgery, we will recheck your labs with your physical in October. Restart all your vitamins and iron.  If you experience rectal bleeding or vaginal bleeding (after you recover from surgery) then you will need to be seen. Signs of worsening anemia are shortness of breath, chest pain, heart beating fast, weakness, extreme tiredness.    Anemia, Nonspecific Anemia is a condition in which the concentration of red blood cells or hemoglobin in the blood is below normal. Hemoglobin is a substance in red blood cells that carries oxygen to the tissues of the body. Anemia results in not enough oxygen reaching these tissues.  CAUSES  Common causes of anemia include:   Excessive bleeding. Bleeding may be internal or external. This includes excessive bleeding from periods (in women) or from the intestine.   Poor nutrition.   Chronic kidney, thyroid, and liver disease.  Bone marrow disorders that decrease red blood cell production.  Cancer and treatments for cancer.  HIV, AIDS, and their treatments.  Spleen problems that increase red blood cell destruction.  Blood disorders.  Excess destruction of red blood cells due to infection, medicines, and autoimmune disorders. SIGNS AND SYMPTOMS   Minor weakness.   Dizziness.   Headache.  Palpitations.   Shortness of breath, especially with exercise.   Paleness.  Cold sensitivity.  Indigestion.  Nausea.  Difficulty sleeping.  Difficulty concentrating. Symptoms may occur suddenly or they may develop slowly.  DIAGNOSIS  Additional blood tests are often needed. These help your health care provider determine the best treatment. Your health care provider will check your stool for blood and look for other causes of blood loss.  TREATMENT  Treatment varies depending on the cause of the anemia. Treatment can include:   Supplements of iron, vitamin 123456, or folic acid.   Hormone medicines.   A blood transfusion. This may  be needed if blood loss is severe.   Hospitalization. This may be needed if there is significant continual blood loss.   Dietary changes.  Spleen removal. HOME CARE INSTRUCTIONS Keep all follow-up appointments. It often takes many weeks to correct anemia, and having your health care provider check on your condition and your response to treatment is very important. SEEK IMMEDIATE MEDICAL CARE IF:   You develop extreme weakness, shortness of breath, or chest pain.   You become dizzy or have trouble concentrating.  You develop heavy vaginal bleeding.   You develop a rash.   You have bloody or black, tarry stools.   You faint.   You vomit up blood.   You vomit repeatedly.   You have abdominal pain.  You have a fever or persistent symptoms for more than 2-3 days.   You have a fever and your symptoms suddenly get worse.   You are dehydrated.  MAKE SURE YOU:  Understand these instructions.  Will watch your condition.  Will get help right away if you are not doing well or get worse.   This information is not intended to replace advice given to you by your health care provider. Make sure you discuss any questions you have with your health care provider.   Document Released: 11/22/2004 Document Revised: 06/17/2013 Document Reviewed: 04/10/2013 Elsevier Interactive Patient Education [employer].

## 2016-05-23 NOTE — Progress Notes (Signed)
Margaret Holmes , 04/14/1941, 75 y.o., female MRN: [id number] Patient Care Team    Relationship Specialty Notifications Start End  [name], DO PCP - General Family Medicine  04/09/16   Richmond Campbell, MD Consulting Physician Gastroenterology  05/23/16   Salvadore Dom, MD Consulting Physician Obstetrics and Gynecology  05/23/16     CC: concerns of anemia Subjective: Pt presents for an acute OV with complaints of abnormal labs at GYN that has her concerned for anemia. Pt was having pre-op labs completed for upcoming GYN surgery and was found to have abnormal Hgb of 11.5. This is consistent with prior collections in care everywhere, with illness. She is asymptomatic. denies fatigue, palpitations, rectal bleeding, vaginal bleeding, etc. She has been off her iron supplement and vitamin supplements since May when she was ill and hospitalized. She does not eat a healthy diet. She has an endometrial lesion, in which she is having surgery on Monday. Colonoscopy 05/2015- internal hemorrhoids grade 2, banding recommended. Personal history of colon polyps prior, on 5 year surveillance by Dr. Earlean Shawl. Pt has not had an EGD, but has h/o GERD with protonix use. She is not taking protonix currently. She was experiencing epigastric discomfort a few weeks ago. She does not routinely take any NSAIDS or Etoh.   Lab Results  Component Value Date   WBC 6.0 05/17/2016   HGB 11.5 (L) 05/17/2016   HCT 36.0 05/17/2016   MCV 90.2 05/17/2016   PLT 230 05/17/2016     Allergies  Allergen Reactions  . Codeine Other (See Comments)    Not specified in old records.  Carlton Adam [Propoxyphene N-Acetaminophen] Other (See Comments)    Not specified in old records  . Hydrocodone Other (See Comments)    Not specified in old records  . Valium [Diazepam]     Psychiatric reaction   Social History  Substance Use Topics  . Smoking status: Never Smoker  . Smokeless tobacco: Never Used  . Alcohol use No   Past  Medical History:  Diagnosis Date  . Adnexal cyst 07/2013   Left-3cm-noted on CT abd/pelv--f/u pelvic u/s did not show anything (L ovary not visualized, likely secondary to bowel gas)-repeat pelvic u/s after 07/2014.  Marland Kitchen Anemia   . Asthma   . DDD (degenerative disc disease), cervical   . H/O angular cheilitis    Zinc normal, vit B12 low normal (2012)  . H/O vitamin D deficiency 2011   Came back to normal range with replacement therapydd  . History of adenomatous polyp of colon 2003, 2006, 2013;2016   Repeat 2013 showed tubular adenoma x 3, with no high grade dysplasia.  2016 no polyps-recall 5 yrs  . History of hiatal hernia   . Hyperlipidemia    per pt report 05/09/12; also on labs 06/2015.  Intolerant of pravachol 02/2016  . Hypothyroidism   . Kidney infection   . Leg cramps   . Lower leg pain    Bilateral, crampy--ABIs/dopplers NORMAL 04/2013.  . Mechanical back pain   . 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 PONV (postoperative nausea and vomiting)    "jerks" afterward  . Shortness of breath dyspnea    Past Surgical History:  Procedure Laterality Date  . BAND HEMORRHOIDECTOMY    . CHOLECYSTECTOMY  1980s  . COLONOSCOPY W/ BIOPSIES AND POLYPECTOMY  06/02/12;05/30/15   No polyps 2016-recall 5 yrs.  +internal hem.  Normal ileoscopy.  Marland Kitchen GANGLION CYST  EXCISION  2003   Right wrist, with excision of some triquetrum spurs  . stent placed right kidney    . THYROIDECTOMY, PARTIAL  1972  . TONSILLECTOMY AND ADENOIDECTOMY  age 83  . TUBAL LIGATION    . WRIST SURGERY  2006   For left thumb carpo-metacarpal arthritis   Family History  Problem Relation Age of Onset  . Cancer Mother     ovarian and throat cancer  . Diabetes Mother   . Alcohol abuse Father     alcoholism.  Died age 81  . Cancer Maternal Grandmother     breast cancer     Medication List       Accurate as of 05/23/16 12:10 PM. Always use your most recent med list.            conjugated estrogens vaginal cream Commonly known as:  PREMARIN Insert 0.5 gram vaginally every night for one week then 3 times per week.   fluticasone 50 MCG/ACT nasal spray Commonly known as:  FLONASE 1-2 sprays in each nostril qAM   levothyroxine 75 MCG tablet Commonly known as:  SYNTHROID, LEVOTHROID TAKE 1 TABLET DAILY   misoprostol 200 MCG tablet Commonly known as:  CYTOTEC Place 2 tablets vaginally the night before surgery.   ondansetron 4 MG disintegrating tablet Commonly known as:  ZOFRAN-ODT Reported on 05/15/2016   pantoprazole 40 MG tablet Commonly known as:  PROTONIX Take 1 tablet (40 mg total) by mouth daily.   pravastatin 40 MG tablet Commonly known as:  PRAVACHOL Take 1 tablet (40 mg total) by mouth daily.   PROAIR HFA 108 (90 Base) MCG/ACT inhaler Generic drug:  albuterol Inhale 1-2 puffs into the lungs every 6 (six) hours as needed. Reported on 05/15/2016       No results found for this or any previous visit (from the past 24 hour(s)). No results found.   ROS: Negative, with the exception of above mentioned in HPI   Objective:  BP 126/70 (BP Location: Right Arm, Patient Position: Sitting, Cuff Size: Normal)   Pulse (!) 54   Temp 98 F (36.7 C)   Resp 18   Ht 5\' 2"  (1.575 m)   Wt 108 lb 12.8 oz (49.4 kg)   SpO2 96%   BMI 19.90 kg/m  Body mass index is 19.9 kg/m. Gen: Afebrile. No acute distress. Nontoxic in appearance, thin female. Pleasant.  HENT: AT. [place].  MMM, no oral lesions.  Eyes:Pupils Equal Round Reactive to light, Extraocular movements intact,  Conjunctiva without redness, discharge or icterus. Neck/lymp/endocrine: Supple, no lymphadenopathy CV: RRR  Chest: CTAB, no wheeze or crackles.  Abd: Soft. NTND. BS present.  Skin: no rashes, purpura or petechiae.  Neuro: Normal gait. PERLA. EOMi. Alert. Oriented x3  Psych: Normal affect, dress and demeanor. Normal speech. Normal thought content and judgment.  Assessment/Plan: Margaret Holmes is a 75 y.o. female present for acute OV for  Low hemoglobin - very mild lower hgb 11.5 (NL 12) with normal indices. Asymptomatic. - pt is to restart her iron and other vitamin supplement.  - she does not complain of GERD symptoms today- she is not taking PPI. She did have decrease in appetite with illness a few weeks ago. Consider possible dietary/nutritoinal deficiency vs ulceration  - recent INR 1.1, PT normal.  - Discussed lab in detail with pt today and presented options to her. She decided to wait until after she recovers from surgery to retest. She will restart all vitamins. Alarm  signs discussed on emergent care and AVS provided to pt on anemia.  - F/U CPE to be scheduled will retest vitamins and anemia panel at that time.     electronically signed by:  Howard Pouch, DO  [place]

## 2016-05-28 ENCOUNTER — Ambulatory Visit (HOSPITAL_COMMUNITY)
Admission: RE | Admit: 2016-05-28 | Discharge: 2016-05-28 | Disposition: A | Discharge status: Home / Self Care | Payer: BC Managed Care – PPO | Source: Ambulatory Visit | Attending: Obstetrics and Gynecology | Admitting: Obstetrics and Gynecology

## 2016-05-28 ENCOUNTER — Encounter (HOSPITAL_COMMUNITY): Payer: Self-pay | Admitting: Emergency Medicine

## 2016-05-28 ENCOUNTER — Ambulatory Visit (HOSPITAL_COMMUNITY): Payer: BC Managed Care – PPO | Admitting: Certified Registered Nurse Anesthetist

## 2016-05-28 ENCOUNTER — Encounter (HOSPITAL_COMMUNITY)
Admission: RE | Disposition: A | Discharge status: Home / Self Care | Payer: Self-pay | Source: Ambulatory Visit | Attending: Obstetrics and Gynecology

## 2016-05-28 ENCOUNTER — Ambulatory Visit (HOSPITAL_COMMUNITY): Payer: BC Managed Care – PPO

## 2016-05-28 DIAGNOSIS — N858 Other specified noninflammatory disorders of uterus: Secondary | ICD-10-CM | POA: Diagnosis not present

## 2016-05-28 DIAGNOSIS — N840 Polyp of corpus uteri: Secondary | ICD-10-CM | POA: Insufficient documentation

## 2016-05-28 DIAGNOSIS — E039 Hypothyroidism, unspecified: Secondary | ICD-10-CM | POA: Insufficient documentation

## 2016-05-28 DIAGNOSIS — K219 Gastro-esophageal reflux disease without esophagitis: Secondary | ICD-10-CM | POA: Insufficient documentation

## 2016-05-28 DIAGNOSIS — Z79890 Hormone replacement therapy (postmenopausal): Secondary | ICD-10-CM | POA: Insufficient documentation

## 2016-05-28 DIAGNOSIS — O26879 Cervical shortening, unspecified trimester: Secondary | ICD-10-CM

## 2016-05-28 DIAGNOSIS — N898 Other specified noninflammatory disorders of vagina: Secondary | ICD-10-CM | POA: Insufficient documentation

## 2016-05-28 HISTORY — PX: DILATATION & CURETTAGE/HYSTEROSCOPY WITH MYOSURE: SHX6511

## 2016-05-28 SURGERY — DILATATION & CURETTAGE/HYSTEROSCOPY WITH MYOSURE
Anesthesia: General

## 2016-05-28 MED ORDER — GLYCOPYRROLATE 0.2 MG/ML IJ SOLN
INTRAMUSCULAR | Status: AC
  Filled 2016-05-28: qty 1

## 2016-05-28 MED ORDER — SODIUM CHLORIDE 0.9 % IR SOLN
Status: DC | PRN
  Administered 2016-05-28: 3000 mL via INTRAVESICAL

## 2016-05-28 MED ORDER — SODIUM CHLORIDE 0.9 % IJ SOLN
INTRAMUSCULAR | Status: AC
  Filled 2016-05-28: qty 100

## 2016-05-28 MED ORDER — ONDANSETRON HCL 4 MG/2ML IJ SOLN: 4.0000 mg | Freq: Once | INTRAMUSCULAR | Status: DC | PRN

## 2016-05-28 MED ORDER — ONDANSETRON HCL 4 MG/2ML IJ SOLN
INTRAMUSCULAR | Status: DC | PRN
  Administered 2016-05-28: 4 mg via INTRAVENOUS

## 2016-05-28 MED ORDER — PROPOFOL 10 MG/ML IV BOLUS
INTRAVENOUS | Status: DC | PRN
  Administered 2016-05-28: 100 mg via INTRAVENOUS
  Administered 2016-05-28 (×2): 50 mg via INTRAVENOUS

## 2016-05-28 MED ORDER — FENTANYL CITRATE (PF) 100 MCG/2ML IJ SOLN: 25.0000 ug | INTRAMUSCULAR | Status: DC | PRN

## 2016-05-28 MED ORDER — ONDANSETRON HCL 4 MG/2ML IJ SOLN
INTRAMUSCULAR | Status: AC
  Filled 2016-05-28: qty 2

## 2016-05-28 MED ORDER — FENTANYL CITRATE (PF) 100 MCG/2ML IJ SOLN
INTRAMUSCULAR | Status: AC
  Filled 2016-05-28: qty 2

## 2016-05-28 MED ORDER — PROPOFOL 10 MG/ML IV BOLUS
INTRAVENOUS | Status: AC
  Filled 2016-05-28: qty 20

## 2016-05-28 MED ORDER — LACTATED RINGERS IV SOLN: INTRAVENOUS | Status: DC

## 2016-05-28 MED ORDER — LIDOCAINE HCL (CARDIAC) 20 MG/ML IV SOLN
INTRAVENOUS | Status: AC
Start: 2016-05-28 — End: 2016-05-28
  Filled 2016-05-28: qty 5

## 2016-05-28 MED ORDER — LACTATED RINGERS IV SOLN
INTRAVENOUS | Status: DC
  Administered 2016-05-28: 09:00:00 via INTRAVENOUS

## 2016-05-28 MED ORDER — LIDOCAINE HCL (CARDIAC) 20 MG/ML IV SOLN
INTRAVENOUS | Status: DC | PRN
  Administered 2016-05-28: 60 mg via INTRAVENOUS

## 2016-05-28 MED ORDER — FENTANYL CITRATE (PF) 100 MCG/2ML IJ SOLN
INTRAMUSCULAR | Status: DC | PRN
  Administered 2016-05-28 (×4): 25 ug via INTRAVENOUS

## 2016-05-28 MED ORDER — VASOPRESSIN 20 UNIT/ML IV SOLN
INTRAVENOUS | Status: AC
  Filled 2016-05-28: qty 1

## 2016-05-28 MED ORDER — SUCCINYLCHOLINE CHLORIDE 20 MG/ML IJ SOLN
INTRAMUSCULAR | Status: DC | PRN
  Administered 2016-05-28: 100 mg via INTRAVENOUS

## 2016-05-28 SURGICAL SUPPLY — 20 items
CANISTER SUCT 3000ML (MISCELLANEOUS) ×3 IMPLANT
CATH ROBINSON RED A/P 16FR (CATHETERS) ×3 IMPLANT
CLOTH BEACON ORANGE TIMEOUT ST (SAFETY) ×3 IMPLANT
CONTAINER PREFILL 10% NBF 60ML (FORM) ×6 IMPLANT
DEVICE MYOSURE LITE (MISCELLANEOUS) IMPLANT
DEVICE MYOSURE REACH (MISCELLANEOUS) IMPLANT
FILTER ARTHROSCOPY CONVERTOR (FILTER) ×3 IMPLANT
GLOVE BIO SURGEON STRL SZ 6.5 (GLOVE) ×2 IMPLANT
GLOVE BIO SURGEONS STRL SZ 6.5 (GLOVE) ×1
GLOVE BIOGEL PI IND STRL 7.0 (GLOVE) ×2 IMPLANT
GLOVE BIOGEL PI INDICATOR 7.0 (GLOVE) ×4
GOWN STRL REUS W/TWL LRG LVL3 (GOWN DISPOSABLE) ×6 IMPLANT
PACK VAGINAL MINOR WOMEN LF (CUSTOM PROCEDURE TRAY) ×3 IMPLANT
PAD OB MATERNITY 4.3X12.25 (PERSONAL CARE ITEMS) ×3 IMPLANT
SEAL ROD LENS SCOPE MYOSURE (ABLATOR) ×3 IMPLANT
SYR 20CC LL (SYRINGE) IMPLANT
TOWEL OR 17X24 6PK STRL BLUE (TOWEL DISPOSABLE) ×6 IMPLANT
TUBING AQUILEX INFLOW (TUBING) ×3 IMPLANT
TUBING AQUILEX OUTFLOW (TUBING) ×3 IMPLANT
WATER STERILE IRR 1000ML POUR (IV SOLUTION) ×3 IMPLANT

## 2016-05-28 NOTE — Anesthesia Postprocedure Evaluation (Signed)
Anesthesia Post Note  Patient: KADRA DIMOND  Procedure(s) Performed: Procedure(s) (LRB): DILATATION & CURETTAGE/HYSTEROSCOPY WITH MYOSURE with ULTRASOUND guidance (N/A)  Patient location during evaluation: PACU Anesthesia Type: General Level of consciousness: awake and alert Pain management: pain level controlled Vital Signs Assessment: post-procedure vital signs reviewed and stable Respiratory status: spontaneous breathing, nonlabored ventilation, respiratory function stable and patient connected to nasal cannula oxygen Cardiovascular status: blood pressure returned to baseline and stable Postop Assessment: no signs of nausea or vomiting Anesthetic complications: no     Last Vitals:  Vitals:   05/28/16 1230 05/28/16 1300  BP:  129/80  Pulse: (!) 55 61  Resp: 11 16  Temp:      Last Pain:  Vitals:   05/28/16 1224  TempSrc: Oral   Pain Goal:                 Catalina Gravel

## 2016-05-28 NOTE — Anesthesia Procedure Notes (Signed)
Procedure Name: Intubation Date/Time: 05/28/2016 10:06 AM Performed by: Catalina Gravel Pre-anesthesia Checklist: Patient identified, Patient being monitored, Timeout performed, Emergency Drugs available and Suction available Patient Re-evaluated:Patient Re-evaluated prior to inductionOxygen Delivery Method: Circle System Utilized Preoxygenation: Pre-oxygenation with 100% oxygen Intubation Type: IV induction Ventilation: Mask ventilation without difficulty Laryngoscope Size: Miller and 2 Grade View: Grade II Tube type: Oral Tube size: 7.0 mm Number of attempts: 1 Airway Equipment and Method: stylet Placement Confirmation: ETT inserted through vocal cords under direct vision,  positive ETCO2 and breath sounds checked- equal and bilateral Secured at: 20 cm Dental Injury: Teeth and Oropharynx as per pre-operative assessment  Comments: Initial LMA #3, unable to achieve a good seal. proceed to ETT

## 2016-05-28 NOTE — Anesthesia Preprocedure Evaluation (Addendum)
Anesthesia Evaluation  Patient identified by MRN, date of birth, ID band Patient awake    Reviewed: Allergy & Precautions, NPO status , Patient's Chart, lab work & pertinent test results  History of Anesthesia Complications (+) PONV and history of anesthetic complications  Airway Mallampati: I  TM Distance: >3 FB Neck ROM: Full    Dental  (+) Dental Advisory Given, Edentulous Upper   Pulmonary asthma ,    Pulmonary exam normal breath sounds clear to auscultation       Cardiovascular Exercise Tolerance: Good negative cardio ROS Normal cardiovascular exam Rhythm:Regular Rate:Normal     Neuro/Psych negative neurological ROS  negative psych ROS   GI/Hepatic Neg liver ROS, hiatal hernia, GERD  ,  Endo/Other  Hypothyroidism   Renal/GU negative Renal ROS     Musculoskeletal  (+) Arthritis , Osteoarthritis,    Abdominal   Peds  Hematology  (+) Blood dyscrasia, anemia ,   Anesthesia Other Findings Day of surgery medications reviewed with the patient.  Reproductive/Obstetrics                           Anesthesia Physical Anesthesia Plan  ASA: II  Anesthesia Plan: General   Post-op Pain Management:    Induction: Intravenous  Airway Management Planned: LMA  Additional Equipment:   Intra-op Plan:   Post-operative Plan: Extubation in OR  Informed Consent: I have reviewed the patients History and Physical, chart, labs and discussed the procedure including the risks, benefits and alternatives for the proposed anesthesia with the patient or authorized representative who has indicated his/her understanding and acceptance.   Dental advisory given  Plan Discussed with: CRNA  Anesthesia Plan Comments: (Risks/benefits of general anesthesia discussed with patient including risk of damage to teeth, lips, gum, and tongue, nausea/vomiting, allergic reactions to medications, and the possibility of  heart attack, stroke and death.  All patient questions answered.  Patient wishes to proceed.)       Anesthesia Quick Evaluation

## 2016-05-28 NOTE — Transfer of Care (Signed)
Immediate Anesthesia Transfer of Care Note  Patient: Margaret Holmes  Procedure(s) Performed: Procedure(s) with comments: DILATATION & CURETTAGE/HYSTEROSCOPY WITH MYOSURE with ULTRASOUND guidance (N/A) - Please have ultrasound in room. Follow Silva's first case.  Patient Location: PACU  Anesthesia Type:General  Level of Consciousness: awake, alert  and oriented  Airway & Oxygen Therapy: Patient Spontanous Breathing and Patient connected to nasal cannula oxygen  Post-op Assessment: Report given to RN, Post -op Vital signs reviewed and stable and Patient moving all extremities X 4  Post vital signs: Reviewed and stable  Last Vitals:  Vitals:   05/28/16 0910  BP: (!) 149/69  Pulse: (!) 57  Resp: 16  Temp: 36.4 C    Last Pain:  Vitals:   05/28/16 0910  TempSrc: Oral         Complications: No apparent anesthesia complications

## 2016-05-28 NOTE — Discharge Instructions (Signed)

## 2016-05-28 NOTE — Interval H&P Note (Signed)
History and Physical Interval Note:  05/28/2016 9:48 AM  Margaret Holmes  has presented today for surgery, with the diagnosis of endometrial polyp, vaginal stenosis  The various methods of treatment have been discussed with the patient and family. After consideration of risks, benefits and other options for treatment, the patient has consented to  Procedure(s) with comments: [place] with ULTRASOUND guidance (N/A) - Please have ultrasound in room. Follow Silva's first case. as a surgical intervention .  The patient's history has been reviewed, patient examined, no change in status, stable for surgery.  I have reviewed the patient's chart and labs.  Questions were answered to the patient's satisfaction.     Salvadore Dom

## 2016-05-28 NOTE — Op Note (Signed)
Preoperative Diagnosis: vaginal scar tissue, endometrial polyp  Postoperative Diagnosis: vaginal scar tissue, normal endometrial cavity  Procedure: Hysteroscopy, dilation and curettage under ultrasound guidance  Surgeon: Dr Sumner Boast  Assistants: None  Anesthesia: GET  EBL: 5 cc  Fluids: 900 cc  Fluid deficit: 55 cc  Urine output: not recorded  Indications for surgery: The patient is a 75 yo female, who presented for evaluation of an ovarian cyst and a uterine mass on CT scan. The cyst has been present for several years and is being followed. The mass was described as a 1.5 cm polyp on my conversation with the radiologist. The patient didn't have any bleeding. Ultrasound was suboptimal. She has scarring of her cervix to her vagina. I was unable to see her cervix in the office.  The risks of the surgery were reviewed with the patient and the consent form was signed prior to her surgery. She was pretreated with several weeks of vaginal estrogen and cytotec the night prior to surgery  Findings: scarring of the vagina, limiting visualization of the cervix. Dilation, hysteroscopy and curettage were all done with ultrasound guidance. She had a normal endometrial cavity, endocervical cavity and normal tubal ostia bilaterally. No polyp was seen  Specimens: endocervical curettage, endometrial curettage   Procedure: The patient was taken to the operating room with an IV in place. She was placed in the dorsal lithotomy position and anesthesia was administered. She was prepped and draped in the usual sterile fashion for a vaginal procedure.  The vagina was very narrow, scarring was felt next to the cervix, some of this was taken down bluntly with an examining finger. A pediatric speculum was placed in the vagina and the single tooth tenaculum was placed on the anterior lip of the cervix. The cervix was dilated to a # 7 hagar dilator, starting with the mini-dilators. This was done with ultrasound  guidance. The uterus was sounded to 7 cm. The myosure hysteroscope was inserted into the uterine cavity. With continuous infusion of normal saline, the uterine cavity was visualized with the above findings. The myosure was then removed. An endocervical curettage was then done. The cavity was then curetted with the small sharp curette. The cavity had the characteristically gritty texture at the end of the procedure. The curette was removed. The hysteroscope was reinserted and the cavity was empty. The speculum and single tooth tenaculum was removed. Oozing from the tenaculum site was stopped with pressure. The speculum was removed. The patients perineum was cleansed of betadine and she was taken out of the dorsal lithotomy position.  Upon awakening the patient was extubated and transferred to the recovery room in stable and awake condition.  The sponge and instrument count were correct. There were no complications.

## 2016-05-31 ENCOUNTER — Encounter (HOSPITAL_COMMUNITY): Payer: Self-pay | Admitting: Obstetrics and Gynecology

## 2016-06-11 ENCOUNTER — Ambulatory Visit (INDEPENDENT_AMBULATORY_CARE_PROVIDER_SITE_OTHER): Payer: BC Managed Care – PPO | Admitting: Obstetrics and Gynecology

## 2016-06-11 ENCOUNTER — Encounter: Payer: Self-pay | Admitting: Obstetrics and Gynecology

## 2016-06-11 VITALS — BP 120/60 | HR 64 | Resp 14 | Wt 109.0 lb

## 2016-06-11 DIAGNOSIS — N83202 Unspecified ovarian cyst, left side: Secondary | ICD-10-CM | POA: Diagnosis not present

## 2016-06-11 DIAGNOSIS — Z9889 Other specified postprocedural states: Secondary | ICD-10-CM | POA: Diagnosis not present

## 2016-06-11 NOTE — Progress Notes (Signed)
GYNECOLOGY  VISIT   HPI: 75 y.o.   Married  Caucasian  female   [phone] with No LMP recorded. Patient is postmenopausal.   here for 2 week post D&C hysteroscopy with myosure. Prior CT was significant for an endometrial mass, no abnormalities were seen on hysteroscopy. She has recovered fine, no c/o, no longer bleeding. She also has a left ovarian cyst, is due for re imaging next month. She has a CT scheduled for 07/03/16,   GYNECOLOGIC HISTORY: No LMP recorded. Patient is postmenopausal. Contraception:postmenopause Menopausal hormone therapy: none         OB History    Gravida Para Term Preterm AB Living   3 3 3     3    SAB TAB Ectopic Multiple Live Births           3         Patient Active Problem List   Diagnosis Date Noted  . Low hemoglobin 05/23/2016  . Puncture wound of thigh 05/07/2016  . Bleeding 05/07/2016  . Abnormal CT scan, pelvis 04/06/2016  . History of recent hospitalization 04/06/2016  . Endometrial disorder 04/06/2016  . Pyelonephritis 04/06/2016  . Hyperlipidemia 02/07/2016  . Hypothyroidism 05/11/2012    Past Medical History:  Diagnosis Date  . Adnexal cyst 07/2013   Left-3cm-noted on CT abd/pelv--f/u pelvic u/s did not show anything (L ovary not visualized, likely secondary to bowel gas)-repeat pelvic u/s after 07/2014.  Marland Kitchen Anemia   . Asthma   . DDD (degenerative disc disease), cervical   . H/O angular cheilitis    Zinc normal, vit B12 low normal (2012)  . H/O vitamin D deficiency 2011   Came back to normal range with replacement therapydd  . History of adenomatous polyp of colon 2003, 2006, 2013;2016   Repeat 2013 showed tubular adenoma x 3, with no high grade dysplasia.  2016 no polyps-recall 5 yrs  . History of hiatal hernia   . Hyperlipidemia    per pt report 05/09/12; also on labs 06/2015.  Intolerant of pravachol 02/2016  . Hypothyroidism   . Kidney infection   . Leg cramps   . Lower leg pain    Bilateral, crampy--ABIs/dopplers NORMAL 04/2013.   . Mechanical back pain   . 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 PONV (postoperative nausea and vomiting)    "jerks" afterward  . Shortness of breath dyspnea     Past Surgical History:  Procedure Laterality Date  . BAND HEMORRHOIDECTOMY    . CHOLECYSTECTOMY  1980s  . COLONOSCOPY W/ BIOPSIES AND POLYPECTOMY  06/02/12;05/30/15   No polyps 2016-recall 5 yrs.  +internal hem.  Normal ileoscopy.  Marland Kitchen DILATATION & CURETTAGE/HYSTEROSCOPY WITH MYOSURE N/A 05/28/2016   Procedure: DILATATION & CURETTAGE/HYSTEROSCOPY WITH MYOSURE with ULTRASOUND guidance;  Surgeon: Salvadore Dom, MD;  Location: [facility];  Service: Gynecology;  Laterality: N/A;  Please have ultrasound in room. Follow Silva's first case.  Marland Kitchen GANGLION CYST EXCISION  2003   Right wrist, with excision of some triquetrum spurs  . stent placed right kidney    . THYROIDECTOMY, PARTIAL  1972  . TONSILLECTOMY AND ADENOIDECTOMY  age 5  . TUBAL LIGATION    . WRIST SURGERY  2006   For left thumb carpo-metacarpal arthritis    Current Outpatient Prescriptions  Medication Sig Dispense Refill  . levothyroxine (SYNTHROID, LEVOTHROID) 75 MCG tablet TAKE 1 TABLET DAILY 90 tablet 3  . pravastatin (PRAVACHOL) 40 MG tablet Take 1  tablet (40 mg total) by mouth daily. (Patient not taking: Reported on 05/07/2016) 90 tablet 1  . PROAIR HFA 108 (90 Base) MCG/ACT inhaler Inhale 1-2 puffs into the lungs every 6 (six) hours as needed. Reported on 05/15/2016     No current facility-administered medications for this visit.      ALLERGIES: Codeine; Darvocet [propoxyphene n-acetaminophen]; Hydrocodone; and Valium [diazepam]  Family History  Problem Relation Age of Onset  . Cancer Mother     ovarian and throat cancer  . Diabetes Mother   . Alcohol abuse Father     alcoholism.  Died age 48  . Cancer Maternal Grandmother     breast cancer    Social History   Social History  . Marital status: Married    Spouse  name: N/A  . Number of children: N/A  . Years of education: N/A   Occupational History  . Not on file.   Social History Main Topics  . Smoking status: Never Smoker  . Smokeless tobacco: Never Used  . Alcohol use No  . Drug use: No  . Sexual activity: No   Other Topics Concern  . Not on file   Social History Narrative   Married, 3 children, 6 grandchildren, 1 GGc.   Orig from this region.  Drives a school bus (X 31 yrs).   No T/A/Ds.   Caffeine: one cup coffee a day, 3 glasses of sweet tea per day.   No formal exercise.  She is active (NOT sedentary).                Review of Systems  Constitutional: Negative.   HENT: Negative.   Eyes: Negative.   Respiratory: Negative.   Cardiovascular: Negative.   Gastrointestinal: Negative.   Genitourinary: Negative.   Musculoskeletal: Negative.   Skin: Positive for rash.  Neurological: Negative.   Endo/Heme/Allergies: Negative.   Psychiatric/Behavioral: Negative.     PHYSICAL EXAMINATION:    BP 120/60 (BP Location: Right Arm, Patient Position: Sitting, Cuff Size: Normal)   Pulse 64   Resp 14   Wt 109 lb (49.4 kg)   BMI 19.94 kg/m     General appearance: alert, cooperative and appears stated age Abdomen: soft, non-tender; non distended; no masses,  no organomegaly   ASSESSMENT S/P hysteroscopy, D&C for an endometrial mass on CT, no abnormalities seen on hysteroscopy. Pathology was benign. No problems since surgery Left ovarian cyst    PLAN The patient will have a copy of her next CT sent here for review, will then need to decide on f/u ultrasound.  She has had a normal CA 125   An After Visit Summary was printed and given to the patient.  CC: Dr Brand Males

## 2016-06-13 ENCOUNTER — Encounter: Payer: Self-pay | Admitting: Adult Health

## 2016-06-13 ENCOUNTER — Ambulatory Visit (INDEPENDENT_AMBULATORY_CARE_PROVIDER_SITE_OTHER): Payer: BC Managed Care – PPO | Admitting: Adult Health

## 2016-06-13 VITALS — BP 110/68 | Temp 97.8°F | Ht 62.0 in | Wt 109.4 lb

## 2016-06-13 DIAGNOSIS — L309 Dermatitis, unspecified: Secondary | ICD-10-CM | POA: Diagnosis not present

## 2016-06-13 NOTE — Patient Instructions (Signed)
It appears as though you have dry skin.   Stop using shoe polish and dial soap   Start using Dove soaps and Aveeno moisturizers and oatmeal baths

## 2016-06-13 NOTE — Progress Notes (Signed)
Pre visit review using our clinic review tool, if applicable. No additional management support is needed unless otherwise documented below in the visit note. 

## 2016-06-13 NOTE — Progress Notes (Signed)
Subjective:    Patient ID: Margaret Holmes, female    DOB: 12/04/1940, 75 y.o.   MRN: [id number]  HPI  75 year old female, patient of Dr. Raoul Pitch. She presents today with the complaint of a " rash on her back and arms." She reports that her symptoms started 4 days ago and since then she has been using aloe and white shoe polish ( this is supposedly good for a rash). She reports no improvement in her symptoms which inlude " itchiness and pain".   She denies any welts, discharge or wounds  She reveals that she uses dial dish soap during her bathes.    Review of Systems  Constitutional: Negative.   Respiratory: Negative.   Cardiovascular: Negative.   Musculoskeletal: Negative.   Skin: Positive for color change and rash.  Neurological: Negative.   All other systems reviewed and are negative.  Past Medical History:  Diagnosis Date  . Adnexal cyst 07/2013   Left-3cm-noted on CT abd/pelv--f/u pelvic u/s did not show anything (L ovary not visualized, likely secondary to bowel gas)-repeat pelvic u/s after 07/2014.  Marland Kitchen Anemia   . Asthma   . DDD (degenerative disc disease), cervical   . H/O angular cheilitis    Zinc normal, vit B12 low normal (2012)  . H/O vitamin D deficiency 2011   Came back to normal range with replacement therapydd  . History of adenomatous polyp of colon 2003, 2006, 2013;2016   Repeat 2013 showed tubular adenoma x 3, with no high grade dysplasia.  2016 no polyps-recall 5 yrs  . History of hiatal hernia   . Hyperlipidemia    per pt report 05/09/12; also on labs 06/2015.  Intolerant of pravachol 02/2016  . Hypothyroidism   . Kidney infection   . Leg cramps   . Lower leg pain    Bilateral, crampy--ABIs/dopplers NORMAL 04/2013.  . Mechanical back pain   . 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 PONV (postoperative nausea and vomiting)    "jerks" afterward  . Shortness of breath dyspnea     Social History   Social History   . Marital status: Married    Spouse name: N/A  . Number of children: N/A  . Years of education: N/A   Occupational History  . Not on file.   Social History Main Topics  . Smoking status: Never Smoker  . Smokeless tobacco: Never Used  . Alcohol use No  . Drug use: No  . Sexual activity: No   Other Topics Concern  . Not on file   Social History Narrative   Married, 3 children, 6 grandchildren, 1 GGc.   Orig from this region.  Drives a school bus (X 31 yrs).   No T/A/Ds.   Caffeine: one cup coffee a day, 3 glasses of sweet tea per day.   No formal exercise.  She is active (NOT sedentary).                Past Surgical History:  Procedure Laterality Date  . BAND HEMORRHOIDECTOMY    . CHOLECYSTECTOMY  1980s  . COLONOSCOPY W/ BIOPSIES AND POLYPECTOMY  06/02/12;05/30/15   No polyps 2016-recall 5 yrs.  +internal hem.  Normal ileoscopy.  Marland Kitchen DILATATION & CURETTAGE/HYSTEROSCOPY WITH MYOSURE N/A 05/28/2016   Procedure: DILATATION & CURETTAGE/HYSTEROSCOPY WITH MYOSURE with ULTRASOUND guidance;  Surgeon: Salvadore Dom, MD;  Location: [facility];  Service: Gynecology;  Laterality: N/A;  Please have ultrasound in  room. Follow Silva's first case.  Marland Kitchen GANGLION CYST EXCISION  2003   Right wrist, with excision of some triquetrum spurs  . stent placed right kidney    . THYROIDECTOMY, PARTIAL  1972  . TONSILLECTOMY AND ADENOIDECTOMY  age 89  . TUBAL LIGATION    . WRIST SURGERY  2006   For left thumb carpo-metacarpal arthritis    Family History  Problem Relation Age of Onset  . Cancer Mother     ovarian and throat cancer  . Diabetes Mother   . Alcohol abuse Father     alcoholism.  Died age 79  . Cancer Maternal Grandmother     breast cancer    Allergies  Allergen Reactions  . Codeine Other (See Comments)    Not specified in old records.  Carlton Adam [Propoxyphene N-Acetaminophen] Other (See Comments)    Not specified in old records  . Hydrocodone Other (See Comments)    Not  specified in old records  . Valium [Diazepam]     Psychiatric reaction    Current Outpatient Prescriptions on File Prior to Visit  Medication Sig Dispense Refill  . levothyroxine (SYNTHROID, LEVOTHROID) 75 MCG tablet TAKE 1 TABLET DAILY 90 tablet 3  . pravastatin (PRAVACHOL) 40 MG tablet Take 1 tablet (40 mg total) by mouth daily. (Patient not taking: Reported on 05/07/2016) 90 tablet 1  . PROAIR HFA 108 (90 Base) MCG/ACT inhaler Inhale 1-2 puffs into the lungs every 6 (six) hours as needed. Reported on 05/15/2016     No current facility-administered medications on file prior to visit.     BP 110/68 (BP Location: Right Arm, Patient Position: Sitting, Cuff Size: Normal)   Temp 97.8 F (36.6 C) (Oral)   Ht 5\' 2"  (1.575 m)   Wt 109 lb 6.4 oz (49.6 kg)   BMI 20.01 kg/m       Objective:   Physical Exam  Constitutional: She is oriented to person, place, and time. She appears well-developed and well-nourished. No distress.  Cardiovascular: Normal rate, regular rhythm, normal heart sounds and intact distal pulses.  Exam reveals no gallop and no friction rub.   No murmur heard. Pulmonary/Chest: Effort normal and breath sounds normal. No respiratory distress. She has no wheezes. She has no rales. She exhibits no tenderness.  Musculoskeletal: Normal range of motion. She exhibits no edema, tenderness or deformity.  Neurological: She is alert and oriented to person, place, and time. She has normal reflexes. She displays normal reflexes. No cranial nerve deficit. She exhibits normal muscle tone. Coordination normal.  Skin: Skin is warm and dry. No rash noted. She is not diaphoretic. No erythema. No pallor.  Dry skin throughout back, shoulders and arms  Psychiatric: She has a normal mood and affect. Her behavior is normal. Judgment and thought content normal.  Nursing note and vitals reviewed.      Assessment & Plan:  1. Dermatitis - No rash noted - Appears as dry skin - Stop using white  shoe polish and dial soap - Start using Aveeno moisturizer and oatmeal baths - Dove soaps - follow up with PCP if no improvement  Dorothyann Peng, NP

## 2016-06-18 ENCOUNTER — Telehealth: Payer: Self-pay | Admitting: Family Medicine

## 2016-06-18 NOTE — Telephone Encounter (Signed)
Spoke with patient explained to her without evaluation we cannot order any tests. Patient offered an appt she declined at this time.

## 2016-06-18 NOTE — Telephone Encounter (Signed)
Patient has started losing a lot of hair again. Is there a test that Dr. Raoul Pitch can run for her? She is also not gaining any of the weight she lost.

## 2016-07-24 ENCOUNTER — Ambulatory Visit (INDEPENDENT_AMBULATORY_CARE_PROVIDER_SITE_OTHER): Payer: BC Managed Care – PPO

## 2016-07-24 DIAGNOSIS — Z23 Encounter for immunization: Secondary | ICD-10-CM | POA: Diagnosis not present

## 2016-07-30 ENCOUNTER — Telehealth: Payer: Self-pay | Admitting: Family Medicine

## 2016-07-30 NOTE — Telephone Encounter (Signed)
Patient has [place].

## 2016-07-30 NOTE — Telephone Encounter (Signed)
Pt called to schedule a fasting CPE for 08/27/16 since she is off work that day. The first available time was 10:45am. Pt would like to know if she can come in the morning prior to her appt and have her labs drawn as she is not able to fast that long. I advised her that I would message Dr Raoul Pitch and if she can then we will call her back and let her know.

## 2016-07-30 NOTE — Telephone Encounter (Signed)
Please let pt know she does not need to fast for appt. She had her lipids checked in April and that is the only reason we would need her to fast.

## 2016-07-30 NOTE — Telephone Encounter (Signed)
Left detailed message, okay per DPR  

## 2016-07-30 NOTE — Telephone Encounter (Signed)
Please advise 

## 2016-07-30 NOTE — Telephone Encounter (Signed)
In order to answer this question appropriately, I would need to know her insurance type and if she is medicare or private insurance, full CPE or medicare wellness.

## 2016-08-01 ENCOUNTER — Telehealth: Payer: Self-pay | Admitting: Obstetrics and Gynecology

## 2016-08-01 NOTE — Telephone Encounter (Signed)
Please let the patient know that her CT scan was abdominal and not pelvic, she still needs a f/u ultrasound to monitor her left ovarian cyst. Please let her know and schedule this for her.

## 2016-08-06 ENCOUNTER — Telehealth: Payer: Self-pay | Admitting: Obstetrics and Gynecology

## 2016-08-06 DIAGNOSIS — N83202 Unspecified ovarian cyst, left side: Secondary | ICD-10-CM

## 2016-08-06 NOTE — Telephone Encounter (Signed)
Please let the patient know that the u/s is to document stability of her left ovarian cyst. Her most recent CT did not evaluate the cyst.  If she can't f/u until December, then I would make the appointment in December.  After you speak with the patient, please send the chart to [facility] to schedule.

## 2016-08-06 NOTE — Telephone Encounter (Signed)
Left message to call Gisel Vipond at [phone]. 

## 2016-08-06 NOTE — Telephone Encounter (Signed)
Patient returned my call in regards to scheduling recommended ultrasound. Patient has advised, at this time she is waiting to "go into the hospital for my kidney problem" States will not be available to scheduled until December. Patient is requesting a call back from our office to explain the reason for the follow up ultrasound. I advised patient I will forward her question to our triage team.  Patient is agreeable.  Routing to Triage  cc: Dr Talbert Nan

## 2016-08-06 NOTE — Telephone Encounter (Signed)
Called patient and requested a return call regarding recommended ultrasound. See previous phone encounter from Dr Talbert Nan.

## 2016-08-07 NOTE — Telephone Encounter (Signed)
Patient returned call

## 2016-08-07 NOTE — Telephone Encounter (Signed)
Spoke with patient. Advised of message as seen below from [facility]. Patient is agreeable and would like to proceed with scheduling at this time. Appointment scheduled for 08/23/2016 at 10:30 am with 11 am consult with Dr.Jertson. patient is agreeable to date and time.  Cc: Lerry Liner  Routing to provider for final review. Patient agreeable to disposition. Will close encounter.

## 2016-08-09 ENCOUNTER — Encounter: Payer: Self-pay | Admitting: Family Medicine

## 2016-08-09 ENCOUNTER — Ambulatory Visit (INDEPENDENT_AMBULATORY_CARE_PROVIDER_SITE_OTHER): Payer: BC Managed Care – PPO | Admitting: Family Medicine

## 2016-08-09 VITALS — BP 122/71 | HR 60 | Temp 97.8°F | Resp 20 | Wt 106.5 lb

## 2016-08-09 DIAGNOSIS — R1011 Right upper quadrant pain: Secondary | ICD-10-CM

## 2016-08-09 LAB — URINALYSIS, ROUTINE W REFLEX MICROSCOPIC
Bilirubin Urine: NEGATIVE
Ketones, ur: NEGATIVE
Nitrite: POSITIVE — AB
Specific Gravity, Urine: 1.01 (ref 1.000–1.030)
Total Protein, Urine: NEGATIVE
Urine Glucose: NEGATIVE
Urobilinogen, UA: 0.2 (ref 0.0–1.0)
pH: 6 (ref 5.0–8.0)

## 2016-08-09 LAB — CBC WITH DIFFERENTIAL/PLATELET
Basophils Absolute: 0.1 10*3/uL (ref 0.0–0.1)
Basophils Relative: 0.7 % (ref 0.0–3.0)
EOS ABS: 0.1 10*3/uL (ref 0.0–0.7)
EOS PCT: 1.1 % (ref 0.0–5.0)
HEMATOCRIT: 35.3 % — AB (ref 36.0–46.0)
HEMOGLOBIN: 11.8 g/dL — AB (ref 12.0–15.0)
LYMPHS PCT: 18.4 % (ref 12.0–46.0)
Lymphs Abs: 1.6 10*3/uL (ref 0.7–4.0)
MCHC: 33.5 g/dL (ref 30.0–36.0)
MCV: 87.8 fl (ref 78.0–100.0)
MONO ABS: 0.7 10*3/uL (ref 0.1–1.0)
Monocytes Relative: 8.6 % (ref 3.0–12.0)
NEUTROS PCT: 71.2 % (ref 43.0–77.0)
Neutro Abs: 6.1 10*3/uL (ref 1.4–7.7)
Platelets: 312 10*3/uL (ref 150.0–400.0)
RBC: 4.02 Mil/uL (ref 3.87–5.11)
RDW: 13.9 % (ref 11.5–15.5)
WBC: 8.5 10*3/uL (ref 4.0–10.5)

## 2016-08-09 LAB — COMPREHENSIVE METABOLIC PANEL
ALT: 13 U/L (ref 0–35)
AST: 16 U/L (ref 0–37)
Albumin: 3.7 g/dL (ref 3.5–5.2)
Alkaline Phosphatase: 67 U/L (ref 39–117)
BILIRUBIN TOTAL: 0.6 mg/dL (ref 0.2–1.2)
BUN: 10 mg/dL (ref 6–23)
CHLORIDE: 102 meq/L (ref 96–112)
CO2: 33 meq/L — AB (ref 19–32)
Calcium: 9.3 mg/dL (ref 8.4–10.5)
Creatinine, Ser: 0.84 mg/dL (ref 0.40–1.20)
GFR: 70.68 mL/min (ref >60.00)
GLUCOSE: 94 mg/dL (ref 70–99)
POTASSIUM: 4.4 meq/L (ref 3.5–5.1)
Sodium: 141 mEq/L (ref 135–145)
Total Protein: 6.9 g/dL (ref 6.0–8.3)

## 2016-08-09 LAB — H. PYLORI ANTIBODY, IGG: H PYLORI IGG: NEGATIVE

## 2016-08-09 MED ORDER — SUCRALFATE 1 G PO TABS: 1.0000 g | ORAL_TABLET | Freq: Three times a day (TID) | ORAL | 0 refills | Status: DC

## 2016-08-09 MED ORDER — TRIAMCINOLONE ACETONIDE 0.025 % EX CREA: 1.0000 "application " | TOPICAL_CREAM | Freq: Two times a day (BID) | CUTANEOUS | 0 refills | Status: DC

## 2016-08-09 MED ORDER — OMEPRAZOLE 40 MG PO CPDR: 40.0000 mg | DELAYED_RELEASE_CAPSULE | Freq: Every day | ORAL | 3 refills | Status: DC

## 2016-08-09 NOTE — Patient Instructions (Addendum)
I will call you with results once available.  Start omeprazole 40 mg daily, stop Protonix.  Carafate pills as directed daily. This helps coat your stomach.

## 2016-08-09 NOTE — Progress Notes (Signed)
Margaret Holmes , January 09, 1941, 75 y.o., female MRN: [id number] Patient Care Team    Relationship Specialty Notifications Start End  [name], DO PCP - General Family Medicine  04/09/16   Richmond Campbell, MD Consulting Physician Gastroenterology  05/23/16   Salvadore Dom, MD Consulting Physician Obstetrics and Gynecology  05/23/16     CC: abd pain  Subjective: Pt presents for an acute OV with complaints of abd pain  of 3 days duration.  Associated symptoms include nausea, fatigue, headache. She has been waking with night sweats the last 3 evenings. She had diarrhea, but states that is common for her after eating salads, which she did. She has a history of hydronephrosis and has been seeing urology with an upcoming appt and procedure scheduled. She also saw them last week, with similar symptoms and urinalysis was completed and normal. She has had a h/o GERD, she does not routinely take her Protonix daily. She states she had an EGD > 10 years ago and at that time had a small hiatal hernia. She reports her stomach hurts to even touch and points to RUQ. She also feels her lower abdomen has been "sore" al over and her right sided pain is different than her normal flank pain. She feels bloated, she is belching. She denies vomit or chills. She is tolerating PO. She is under the care of gynecology for ovarian mass. Her CA-125 was normal and she has had recent abd imagining. She has had a cholecystotomy.   Allergies  Allergen Reactions  . Codeine Other (See Comments)    Not specified in old records.  Carlton Adam [Propoxyphene N-Acetaminophen] Other (See Comments)    Not specified in old records  . Hydrocodone Other (See Comments)    Not specified in old records  . Valium [Diazepam]     Psychiatric reaction   Social History  Substance Use Topics  . Smoking status: Never Smoker  . Smokeless tobacco: Never Used  . Alcohol use No   Past Medical History:  Diagnosis Date  . Adnexal cyst  07/2013   Left-3cm-noted on CT abd/pelv--f/u pelvic u/s did not show anything (L ovary not visualized, likely secondary to bowel gas)-repeat pelvic u/s after 07/2014.  Marland Kitchen Anemia   . Asthma   . DDD (degenerative disc disease), cervical   . H/O angular cheilitis    Zinc normal, vit B12 low normal (2012)  . H/O vitamin D deficiency 2011   Came back to normal range with replacement therapydd  . History of adenomatous polyp of colon 2003, 2006, 2013;2016   Repeat 2013 showed tubular adenoma x 3, with no high grade dysplasia.  2016 no polyps-recall 5 yrs  . History of hiatal hernia   . Hyperlipidemia    per pt report 05/09/12; also on labs 06/2015.  Intolerant of pravachol 02/2016  . Hypothyroidism   . Kidney infection   . Leg cramps   . Lower leg pain    Bilateral, crampy--ABIs/dopplers NORMAL 04/2013.  . Mechanical back pain   . 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 PONV (postoperative nausea and vomiting)    "jerks" afterward  . Shortness of breath dyspnea    Past Surgical History:  Procedure Laterality Date  . BAND HEMORRHOIDECTOMY    . CHOLECYSTECTOMY  1980s  . COLONOSCOPY W/ BIOPSIES AND POLYPECTOMY  06/02/12;05/30/15   No polyps 2016-recall 5 yrs.  +internal hem.  Normal ileoscopy.  Marland Kitchen DILATATION &  CURETTAGE/HYSTEROSCOPY WITH MYOSURE N/A 05/28/2016   Procedure: DILATATION & CURETTAGE/HYSTEROSCOPY WITH MYOSURE with ULTRASOUND guidance;  Surgeon: Salvadore Dom, MD;  Location: [facility];  Service: Gynecology;  Laterality: N/A;  Please have ultrasound in room. Follow Silva's first case.  Marland Kitchen GANGLION CYST EXCISION  2003   Right wrist, with excision of some triquetrum spurs  . stent placed right kidney    . THYROIDECTOMY, PARTIAL  1972  . TONSILLECTOMY AND ADENOIDECTOMY  age 27  . TUBAL LIGATION    . WRIST SURGERY  2006   For left thumb carpo-metacarpal arthritis   Family History  Problem Relation Age of Onset  . Cancer Mother     ovarian and  throat cancer  . Diabetes Mother   . Alcohol abuse Father     alcoholism.  Died age 29  . Cancer Maternal Grandmother     breast cancer     Medication List       Accurate as of 08/09/16 10:11 AM. Always use your most recent med list.          levothyroxine 75 MCG tablet Commonly known as:  SYNTHROID, LEVOTHROID TAKE 1 TABLET DAILY   pantoprazole 40 MG tablet Commonly known as:  PROTONIX   pravastatin 40 MG tablet Commonly known as:  PRAVACHOL Take 1 tablet (40 mg total) by mouth daily.   PROAIR HFA 108 (90 Base) MCG/ACT inhaler Generic drug:  albuterol Inhale 1-2 puffs into the lungs every 6 (six) hours as needed. Reported on 05/15/2016       No results found for this or any previous visit (from the past 24 hour(s)). No results found.   ROS: Negative, with the exception of above mentioned in HPI   Objective:  BP 122/71 (BP Location: Right Arm, Patient Position: Sitting, Cuff Size: Normal)   Pulse 60   Temp 97.8 F (36.6 C)   Resp 20   Wt 106 lb 8 oz (48.3 kg)   SpO2 96%   BMI 19.48 kg/m  Body mass index is 19.48 kg/m. Gen: Afebrile. No acute distress. Nontoxic in appearance, well developed, thin caucasian female.  HENT: AT. [place]. MMM, Eyes:Pupils Equal Round Reactive to light, Extraocular movements intact,  Conjunctiva without redness, discharge or icterus. CV: RRR , no edema.  Chest: CTAB, no wheeze or crackles. Good air movement, normal resp effort.  Abd: Soft.flat.ND. Moderate TTP epigastric and RUQ, diffuse mild tenderness lower abd.  BS present. no Masses palpated. No rebound or guarding.  MSK: no CVA tenderness.  Skin: No  rashes, purpura or petechiae. tanglesia rpesent.  Neuro: Normal gait. PERLA. EOMi. Alert. Oriented x3  Psych: Anxious. Normal speech. Normal thought content and judgment.  Assessment/Plan: Margaret Holmes is a 75 y.o. female present for acute OV for  Right upper quadrant abdominal pain - uncertain etiology, could be related to  GERD, kidney issues or development of ulceration. She declined zofran for nausea. Switched to omeprazole high dose for 4 weeks + Carafate.  - CBC w/Diff - Comp Met (CMET) - Urinalysis, Routine w reflex microscopic - H. pylori antibody, IgG - omeprazole (PRILOSEC) 40 MG capsule; Take 1 capsule (40 mg total) by mouth daily.  Dispense: 30 capsule; Refill: 3 - sucralfate (CARAFATE) 1 g tablet; Take 1 tablet (1 g total) by mouth 4 (four) times daily -  with meals and at bedtime.  Dispense: 120 tablet; Refill: 0 - awaiting lab results, if indicated will start abx  - f/u dependent on lab results  >  25 minutes spent with patient, >50% of time spent face to face counseling patient and coordinating care.   electronically signed by:  Howard Pouch, DO  [place]

## 2016-08-10 ENCOUNTER — Other Ambulatory Visit: Payer: BC Managed Care – PPO

## 2016-08-10 ENCOUNTER — Telehealth: Payer: Self-pay | Admitting: Family Medicine

## 2016-08-10 DIAGNOSIS — R8290 Unspecified abnormal findings in urine: Secondary | ICD-10-CM

## 2016-08-10 MED ORDER — CEPHALEXIN 500 MG PO CAPS: 500.0000 mg | ORAL_CAPSULE | Freq: Three times a day (TID) | ORAL | 0 refills | Status: DC

## 2016-08-10 NOTE — Telephone Encounter (Signed)
Spoke with patient husband reviewed information and instructions he will let Kadidja know.

## 2016-08-10 NOTE — Telephone Encounter (Signed)
Please call pt: - her urine appears infected. I have sent it for culture, but I want to cover her with abx now.  - I have called in keflex for her to take TID for 7 days. If she is unable to tolerate this medication, she should be seen immediately.

## 2016-08-11 LAB — URINE CULTURE

## 2016-08-13 ENCOUNTER — Telehealth: Payer: Self-pay | Admitting: Family Medicine

## 2016-08-13 NOTE — Telephone Encounter (Signed)
Please call pt: - her urine culture did not results with a specific bacteria. I would like her to continue the abx, if her symptoms are not improving would need to see her back.  She has an appt at the end of the month for CPE, we can follow up then with this issue as long as she is improving.

## 2016-08-13 NOTE — Telephone Encounter (Signed)
Spoke with patient she had stent placed today she will follow up with [place] on 08/27/16.

## 2016-08-14 ENCOUNTER — Telehealth: Payer: Self-pay | Admitting: Obstetrics and Gynecology

## 2016-08-14 NOTE — Telephone Encounter (Signed)
Spoke with patient. Patient states she had a "bladder stent" placed by [facility] Specialist at [facility] 08/13/16. Patient states the stent will be removed 08/31/16. Patient would like to know if ok to continue with PUS 08/23/16  For left ovarian cyst while stent in place or reschedule. Advised patient I would have to review with Dr. Talbert Nan for recommendations and return call. Patient is agreeable. Patient states to leave message on machine if not available.    Dr. Talbert Nan, please advise?

## 2016-08-14 NOTE — Telephone Encounter (Signed)
Spoke with patient. Advised of Dr. Gentry Fitz recommendations as seen below. RN advised to keep 08/23/16 PUS appointment at 10:30am. Patient verbalizes understanding and is agreeable.   Routing to provider for final review. Patient is agreeable to disposition. Will close encounter.

## 2016-08-14 NOTE — Telephone Encounter (Signed)
Patient is wondering if she will need to reschedule her PUS appointment 08/23/16 due to recent stent placement/

## 2016-08-14 NOTE — Telephone Encounter (Signed)
Yes, it's fine to do the ultrasound with the stent in place.

## 2016-08-23 ENCOUNTER — Encounter: Payer: Self-pay | Admitting: Obstetrics and Gynecology

## 2016-08-23 ENCOUNTER — Ambulatory Visit (INDEPENDENT_AMBULATORY_CARE_PROVIDER_SITE_OTHER): Payer: BC Managed Care – PPO

## 2016-08-23 ENCOUNTER — Ambulatory Visit (INDEPENDENT_AMBULATORY_CARE_PROVIDER_SITE_OTHER): Payer: BC Managed Care – PPO | Admitting: Obstetrics and Gynecology

## 2016-08-23 VITALS — BP 110/66 | HR 60 | Resp 14 | Ht 62.0 in | Wt 106.0 lb

## 2016-08-23 DIAGNOSIS — E2839 Other primary ovarian failure: Secondary | ICD-10-CM | POA: Diagnosis not present

## 2016-08-23 DIAGNOSIS — N83202 Unspecified ovarian cyst, left side: Secondary | ICD-10-CM | POA: Diagnosis not present

## 2016-08-23 NOTE — Progress Notes (Signed)
GYNECOLOGY  VISIT   HPI: 75 y.o.   Married  Caucasian  female   [phone] with No LMP recorded. Patient is postmenopausal.   here for pelvic ultrasound to f/u on a left ovarian cyst.   The patient was initially noted to have a 3 cm left adnexal cyst in 10/14 on a CT scan. She had a f/u ultrasound that was negative for an ovarian cyst. Her left ovary was not seen. She then had a CT scan in 5/17 that showed a 3.6 x 3.7 left adnexal cyst. F/U ultrasound in 6/17 showed a 3.8 x 3,7 x 3.1 cm left simple ovarian cyst.   GYNECOLOGIC HISTORY: No LMP recorded. Patient is postmenopausal. Contraception: Post menopausal  Menopausal hormone therapy: None        OB History    Gravida Para Term Preterm AB Living   3 3 3     3    SAB TAB Ectopic Multiple Live Births           3         Patient Active Problem List   Diagnosis Date Noted  . Low hemoglobin 05/23/2016  . Puncture wound of thigh 05/07/2016  . Bleeding 05/07/2016  . Abnormal CT scan, pelvis 04/06/2016  . History of recent hospitalization 04/06/2016  . Endometrial disorder 04/06/2016  . Pyelonephritis 04/06/2016  . Hyperlipidemia 02/07/2016  . Hypothyroidism 05/11/2012    Past Medical History:  Diagnosis Date  . Adnexal cyst 07/2013   Left-3cm-noted on CT abd/pelv--f/u pelvic u/s did not show anything (L ovary not visualized, likely secondary to bowel gas)-repeat pelvic u/s after 07/2014.  Marland Kitchen Anemia   . Asthma   . DDD (degenerative disc disease), cervical   . H/O angular cheilitis    Zinc normal, vit B12 low normal (2012)  . H/O vitamin D deficiency 2011   Came back to normal range with replacement therapydd  . History of adenomatous polyp of colon 2003, 2006, 2013;2016   Repeat 2013 showed tubular adenoma x 3, with no high grade dysplasia.  2016 no polyps-recall 5 yrs  . History of hiatal hernia   . Hyperlipidemia    per pt report 05/09/12; also on labs 06/2015.  Intolerant of pravachol 02/2016  . Hypothyroidism   . Kidney  infection   . Leg cramps   . Lower leg pain    Bilateral, crampy--ABIs/dopplers NORMAL 04/2013.  . Mechanical back pain   . 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 PONV (postoperative nausea and vomiting)    "jerks" afterward  . Shortness of breath dyspnea     Past Surgical History:  Procedure Laterality Date  . BAND HEMORRHOIDECTOMY    . CHOLECYSTECTOMY  1980s  . COLONOSCOPY W/ BIOPSIES AND POLYPECTOMY  06/02/12;05/30/15   No polyps 2016-recall 5 yrs.  +internal hem.  Normal ileoscopy.  Marland Kitchen DILATATION & CURETTAGE/HYSTEROSCOPY WITH MYOSURE N/A 05/28/2016   Procedure: DILATATION & CURETTAGE/HYSTEROSCOPY WITH MYOSURE with ULTRASOUND guidance;  Surgeon: Salvadore Dom, MD;  Location: [facility];  Service: Gynecology;  Laterality: N/A;  Please have ultrasound in room. Follow Silva's first case.  Marland Kitchen GANGLION CYST EXCISION  2003   Right wrist, with excision of some triquetrum spurs  . stent placed right kidney    . THYROIDECTOMY, PARTIAL  1972  . TONSILLECTOMY AND ADENOIDECTOMY  age 54  . TUBAL LIGATION    . WRIST SURGERY  2006   For left thumb carpo-metacarpal arthritis  Current Outpatient Prescriptions  Medication Sig Dispense Refill  . acetaminophen (TYLENOL) 325 MG tablet Take 650 mg by mouth every 6 (six) hours as needed.    Marland Kitchen levothyroxine (SYNTHROID, LEVOTHROID) 75 MCG tablet TAKE 1 TABLET DAILY 90 tablet 3  . omeprazole (PRILOSEC) 40 MG capsule Take 1 capsule (40 mg total) by mouth daily. 30 capsule 3  . oxybutynin (DITROPAN) 5 MG tablet Take 1 tablet by mouth 3 (three) times daily.    . phenazopyridine (PYRIDIUM) 100 MG tablet Take 100 mg by mouth 3 (three) times daily as needed for pain.    Marland Kitchen sucralfate (CARAFATE) 1 g tablet Take 1 tablet (1 g total) by mouth 4 (four) times daily -  with meals and at bedtime. 120 tablet 0  . triamcinolone (KENALOG) 0.025 % cream Apply 1 application topically 2 (two) times daily. 30 g 0  .  oxyCODONE-acetaminophen (PERCOCET/ROXICET) 5-325 MG tablet Take by mouth.    Marland Kitchen PROAIR HFA 108 (90 Base) MCG/ACT inhaler Inhale 1-2 puffs into the lungs every 6 (six) hours as needed. Reported on 05/15/2016     No current facility-administered medications for this visit.      ALLERGIES: Codeine; Darvocet [propoxyphene n-acetaminophen]; Hydrocodone; and Valium [diazepam]  Family History  Problem Relation Age of Onset  . Cancer Mother     ovarian and throat cancer  . Diabetes Mother   . Alcohol abuse Father     alcoholism.  Died age 61  . Cancer Maternal Grandmother     breast cancer    Social History   Social History  . Marital status: Married    Spouse name: N/A  . Number of children: N/A  . Years of education: N/A   Occupational History  . Not on file.   Social History Main Topics  . Smoking status: Never Smoker  . Smokeless tobacco: Never Used  . Alcohol use No  . Drug use: No  . Sexual activity: No   Other Topics Concern  . Not on file   Social History Narrative   Married, 3 children, 6 grandchildren, 1 GGc.   Orig from this region.  Drives a school bus (X 31 yrs).   No T/A/Ds.   Caffeine: one cup coffee a day, 3 glasses of sweet tea per day.   No formal exercise.  She is active (NOT sedentary).                Review of Systems  Constitutional: Negative.   HENT: Negative.   Eyes: Negative.   Respiratory: Negative.   Cardiovascular: Negative.   Gastrointestinal: Negative.   Genitourinary: Negative.   Musculoskeletal: Negative.   Skin: Negative.   Neurological: Negative.   Endo/Heme/Allergies: Bruises/bleeds easily.  Psychiatric/Behavioral: Negative.     PHYSICAL EXAMINATION:    BP 110/66 (BP Location: Right Arm, Patient Position: Sitting, Cuff Size: Normal)   Pulse 60   Resp 14   Ht 5\' 2"  (1.575 m)   Wt 106 lb (48.1 kg)   BMI 19.39 kg/m     General appearance: alert, cooperative and appears stated age  Ultrasound images reviewed with the  patient  ASSESSMENT Stable, simple left ovarian cyst 3.7 cm. Suspect this is the same cyst that was seen on CT in 10/14. Stable since her last imaging in 6/17. Benign appearing    PLAN Recommend f/u ultrasound in 1 year She states she is due for a screening DEXA and mammogram (order placed for DEXA) F/U for her annual in 2 months (  here or at her prior GYN, already scheduled there)   An After Visit Summary was printed and given to the patient.      CC: Howard Pouch, DO

## 2016-08-24 ENCOUNTER — Other Ambulatory Visit: Payer: Self-pay | Admitting: Obstetrics and Gynecology

## 2016-08-24 DIAGNOSIS — Z1231 Encounter for screening mammogram for malignant neoplasm of breast: Secondary | ICD-10-CM

## 2016-08-24 NOTE — Progress Notes (Signed)
Pre visit review using our clinic review tool, if applicable. No additional management support is needed unless otherwise documented below in the visit note. 

## 2016-08-24 NOTE — Progress Notes (Signed)
  Error. Patient not seen for AWV due to insurance (part A only).

## 2016-08-27 ENCOUNTER — Encounter: Payer: Self-pay | Admitting: Family Medicine

## 2016-08-27 ENCOUNTER — Encounter: Payer: BC Managed Care – PPO | Admitting: Family Medicine

## 2016-08-27 ENCOUNTER — Ambulatory Visit (INDEPENDENT_AMBULATORY_CARE_PROVIDER_SITE_OTHER): Payer: BC Managed Care – PPO | Admitting: Family Medicine

## 2016-08-27 VITALS — BP 102/68 | HR 68 | Temp 97.9°F | Resp 20 | Ht 62.0 in | Wt 104.8 lb

## 2016-08-27 DIAGNOSIS — Z23 Encounter for immunization: Secondary | ICD-10-CM | POA: Diagnosis not present

## 2016-08-27 DIAGNOSIS — R634 Abnormal weight loss: Secondary | ICD-10-CM | POA: Diagnosis not present

## 2016-08-27 DIAGNOSIS — Z8639 Personal history of other endocrine, nutritional and metabolic disease: Secondary | ICD-10-CM

## 2016-08-27 DIAGNOSIS — D649 Anemia, unspecified: Secondary | ICD-10-CM | POA: Insufficient documentation

## 2016-08-27 DIAGNOSIS — E039 Hypothyroidism, unspecified: Secondary | ICD-10-CM | POA: Diagnosis not present

## 2016-08-27 DIAGNOSIS — Z0000 Encounter for general adult medical examination without abnormal findings: Secondary | ICD-10-CM | POA: Diagnosis not present

## 2016-08-27 DIAGNOSIS — Z1159 Encounter for screening for other viral diseases: Secondary | ICD-10-CM

## 2016-08-27 DIAGNOSIS — B370 Candidal stomatitis: Secondary | ICD-10-CM

## 2016-08-27 LAB — VITAMIN D 25 HYDROXY (VIT D DEFICIENCY, FRACTURES): VITD: 25.04 ng/mL — AB (ref 30.00–100.00)

## 2016-08-27 LAB — TSH: TSH: 0.76 u[IU]/mL (ref 0.35–4.50)

## 2016-08-27 LAB — T4, FREE: Free T4: 1.02 ng/dL (ref 0.60–1.60)

## 2016-08-27 MED ORDER — FLUCONAZOLE 150 MG PO TABS: ORAL_TABLET | ORAL | 0 refills | Status: DC

## 2016-08-27 MED ORDER — NYSTATIN 100000 UNIT/ML MT SUSP: 5.0000 mL | Freq: Four times a day (QID) | OROMUCOSAL | 0 refills | Status: AC

## 2016-08-27 NOTE — Patient Instructions (Signed)
Health Maintenance, Female Adopting a healthy lifestyle and getting preventive care can go a long way to promote health and wellness. Talk with your health care provider about what schedule of regular examinations is right for you. This is a good chance for you to check in with your provider about disease prevention and staying healthy. In between checkups, there are plenty of things you can do on your own. Experts have done a lot of research about which lifestyle changes and preventive measures are most likely to keep you healthy. Ask your health care provider for more information. WEIGHT AND DIET  Eat a healthy diet  Be sure to include plenty of vegetables, fruits, low-fat dairy products, and lean protein.  Do not eat a lot of foods high in solid fats, added sugars, or salt.  Get regular exercise. This is one of the most important things you can do for your health.  Most adults should exercise for at least 150 minutes each week. The exercise should increase your heart rate and make you sweat (moderate-intensity exercise).  Most adults should also do strengthening exercises at least twice a week. This is in addition to the moderate-intensity exercise.  Maintain a healthy weight  Body mass index (BMI) is a measurement that can be used to identify possible weight problems. It estimates body fat based on height and weight. Your health care provider can help determine your BMI and help you achieve or maintain a healthy weight.  For females 20 years of age and older:   A BMI below 18.5 is considered underweight.  A BMI of 18.5 to 24.9 is normal.  A BMI of 25 to 29.9 is considered overweight.  A BMI of 30 and above is considered obese.  Watch levels of cholesterol and blood lipids  You should start having your blood tested for lipids and cholesterol at 73 years of age, then have this test every 5 years.  You may need to have your cholesterol levels checked more often if:  Your lipid  or cholesterol levels are high.  You are older than 73 years of age.  You are at high risk for heart disease.  CANCER SCREENING   Lung Cancer  Lung cancer screening is recommended for adults 55-80 years old who are at high risk for lung cancer because of a history of smoking.  A yearly low-dose CT scan of the lungs is recommended for people who:  Currently smoke.  Have quit within the past 15 years.  Have at least a 30-pack-year history of smoking. A pack year is smoking an average of one pack of cigarettes a day for 1 year.  Yearly screening should continue until it has been 15 years since you quit.  Yearly screening should stop if you develop a health problem that would prevent you from having lung cancer treatment.  Breast Cancer  Practice breast self-awareness. This means understanding how your breasts normally appear and feel.  It also means doing regular breast self-exams. Let your health care provider know about any changes, no matter how small.  If you are in your 20s or 30s, you should have a clinical breast exam (CBE) by a health care provider every 1-3 years as part of a regular health exam.  If you are 40 or older, have a CBE every year. Also consider having a breast X-ray (mammogram) every year.  If you have a family history of breast cancer, talk to your health care provider about genetic screening.  If you   are at high risk for breast cancer, talk to your health care provider about having an MRI and a mammogram every year.  Breast cancer gene (BRCA) assessment is recommended for women who have family members with BRCA-related cancers. BRCA-related cancers include:  Breast.  Ovarian.  Tubal.  Peritoneal cancers.  Results of the assessment will determine the need for genetic counseling and BRCA1 and BRCA2 testing. Cervical Cancer Your health care provider may recommend that you be screened regularly for cancer of the pelvic organs (ovaries, uterus, and  vagina). This screening involves a pelvic examination, including checking for microscopic changes to the surface of your cervix (Pap test). You may be encouraged to have this screening done every 3 years, beginning at age 21.  For women ages 30-65, health care providers may recommend pelvic exams and Pap testing every 3 years, or they may recommend the Pap and pelvic exam, combined with testing for human papilloma virus (HPV), every 5 years. Some types of HPV increase your risk of cervical cancer. Testing for HPV may also be done on women of any age with unclear Pap test results.  Other health care providers may not recommend any screening for nonpregnant women who are considered low risk for pelvic cancer and who do not have symptoms. Ask your health care provider if a screening pelvic exam is right for you.  If you have had past treatment for cervical cancer or a condition that could lead to cancer, you need Pap tests and screening for cancer for at least 20 years after your treatment. If Pap tests have been discontinued, your risk factors (such as having a new sexual partner) need to be reassessed to determine if screening should resume. Some women have medical problems that increase the chance of getting cervical cancer. In these cases, your health care provider may recommend more frequent screening and Pap tests. Colorectal Cancer  This type of cancer can be detected and often prevented.  Routine colorectal cancer screening usually begins at 73 years of age and continues through 73 years of age.  Your health care provider may recommend screening at an earlier age if you have risk factors for colon cancer.  Your health care provider may also recommend using home test kits to check for hidden blood in the stool.  A small camera at the end of a tube can be used to examine your colon directly (sigmoidoscopy or colonoscopy). This is done to check for the earliest forms of colorectal  cancer.  Routine screening usually begins at age 50.  Direct examination of the colon should be repeated every 5-10 years through 73 years of age. However, you may need to be screened more often if early forms of precancerous polyps or small growths are found. Skin Cancer  Check your skin from head to toe regularly.  Tell your health care provider about any new moles or changes in moles, especially if there is a change in a mole's shape or color.  Also tell your health care provider if you have a mole that is larger than the size of a pencil eraser.  Always use sunscreen. Apply sunscreen liberally and repeatedly throughout the day.  Protect yourself by wearing long sleeves, pants, a wide-brimmed hat, and sunglasses whenever you are outside. HEART DISEASE, DIABETES, AND HIGH BLOOD PRESSURE   High blood pressure causes heart disease and increases the risk of stroke. High blood pressure is more likely to develop in:  People who have blood pressure in the high end   of the normal range (130-139/85-89 mm Hg).  People who are overweight or obese.  People who are African American.  If you are 38-23 years of age, have your blood pressure checked every 3-5 years. If you are 61 years of age or older, have your blood pressure checked every year. You should have your blood pressure measured twice--once when you are at a hospital or clinic, and once when you are not at a hospital or clinic. Record the average of the two measurements. To check your blood pressure when you are not at a hospital or clinic, you can use:  An automated blood pressure machine at a pharmacy.  A home blood pressure monitor.  If you are between 45 years and 39 years old, ask your health care provider if you should take aspirin to prevent strokes.  Have regular diabetes screenings. This involves taking a blood sample to check your fasting blood sugar level.  If you are at a normal weight and have a low risk for diabetes,  have this test once every three years after 73 years of age.  If you are overweight and have a high risk for diabetes, consider being tested at a younger age or more often. PREVENTING INFECTION  Hepatitis B  If you have a higher risk for hepatitis B, you should be screened for this virus. You are considered at high risk for hepatitis B if:  You were born in a country where hepatitis B is common. Ask your health care provider which countries are considered high risk.  Your parents were born in a high-risk country, and you have not been immunized against hepatitis B (hepatitis B vaccine).  You have HIV or AIDS.  You use needles to inject street drugs.  You live with someone who has hepatitis B.  You have had sex with someone who has hepatitis B.  You get hemodialysis treatment.  You take certain medicines for conditions, including cancer, organ transplantation, and autoimmune conditions. Hepatitis C  Blood testing is recommended for:  Everyone born from [birth year] through 1963.  Anyone with known risk factors for hepatitis C. Sexually transmitted infections (STIs)  You should be screened for sexually transmitted infections (STIs) including gonorrhea and chlamydia if:  You are sexually active and are younger than 73 years of age.  You are older than 73 years of age and your health care provider tells you that you are at risk for this type of infection.  Your sexual activity has changed since you were last screened and you are at an increased risk for chlamydia or gonorrhea. Ask your health care provider if you are at risk.  If you do not have HIV, but are at risk, it may be recommended that you take a prescription medicine daily to prevent HIV infection. This is called pre-exposure prophylaxis (PrEP). You are considered at risk if:  You are sexually active and do not regularly use condoms or know the HIV status of your partner(s).  You take drugs by injection.  You are sexually  active with a partner who has HIV. Talk with your health care provider about whether you are at high risk of being infected with HIV. If you choose to begin PrEP, you should first be tested for HIV. You should then be tested every 3 months for as long as you are taking PrEP.  PREGNANCY   If you are premenopausal and you may become pregnant, ask your health care provider about preconception counseling.  If you may  become pregnant, take 400 to 800 micrograms (mcg) of folic acid every day.  If you want to prevent pregnancy, talk to your health care provider about birth control (contraception). OSTEOPOROSIS AND MENOPAUSE   Osteoporosis is a disease in which the bones lose minerals and strength with aging. This can result in serious bone fractures. Your risk for osteoporosis can be identified using a bone density scan.  If you are 61 years of age or older, or if you are at risk for osteoporosis and fractures, ask your health care provider if you should be screened.  Ask your health care provider whether you should take a calcium or vitamin D supplement to lower your risk for osteoporosis.  Menopause may have certain physical symptoms and risks.  Hormone replacement therapy may reduce some of these symptoms and risks. Talk to your health care provider about whether hormone replacement therapy is right for you.  HOME CARE INSTRUCTIONS   Schedule regular health, dental, and eye exams.  Stay current with your immunizations.   Do not use any tobacco products including cigarettes, chewing tobacco, or electronic cigarettes.  If you are pregnant, do not drink alcohol.  If you are breastfeeding, limit how much and how often you drink alcohol.  Limit alcohol intake to no more than 1 drink per day for nonpregnant women. One drink equals 12 ounces of beer, 5 ounces of wine, or 1 ounces of hard liquor.  Do not use street drugs.  Do not share needles.  Ask your health care provider for help if  you need support or information about quitting drugs.  Tell your health care provider if you often feel depressed.  Tell your health care provider if you have ever been abused or do not feel safe at home.   This information is not intended to replace advice given to you by your health care provider. Make sure you discuss any questions you have with your health care provider.   Document Released: 04/30/2011 Document Revised: 11/05/2014 Document Reviewed: 09/16/2013 Elsevier Interactive Patient Education [employer].

## 2016-08-27 NOTE — Progress Notes (Signed)
   Patient ID: Margaret Holmes, female  DOB: 10/08/1941, 74 y.o.   MRN: [id number] Patient Care Team    Relationship Specialty Notifications Start End  [name] A Kuneff, DO PCP - General Family Medicine  04/09/16   Jeffrey Medoff, MD Consulting Physician Gastroenterology  05/23/16   Jill Evelyn Jertson, MD Consulting Physician Obstetrics and Gynecology  05/23/16   John M Wilson, MD Referring Physician Urology  08/27/16     Subjective:  Margaret Holmes is a 74 y.o.  Female  present for CPE. All past medical history, surgical history, allergies, family history, immunizations, medications and social history were updated in the electronic medical record today. All recent labs, ED visits and hospitalizations within the last year were reviewed.  Patient states she is not sleeping well. She will sleep about 2 hours and then wake up. She is losing weight and she is uncertain why that could be. She admits she is not eating well, because they told her to drink more water and when she increase her water she has no appetite. She was having difficulties swallowing after her surgery. She has been using some old magic mouthwash that seems to have started to help. She is uncertain the components in her mouthwash, but feels like it was ordered for thrush. She endorses white plaque like build up in her mouth.    Health maintenance:  Colonoscopy: completed 05/2015, by Medoff, resutls hemorrhoid. follow up 5 years. Mammogram: completed:08/2014, birads 1. She has scheduled for a few weeks. Cervical cancer screening: last pap: 2017, she is being followed by GYN.  Immunizations: tdap 08/2016, Influenza 06/2016 (encouraged yearly), PNA series 07/28/2015, zostavax complicated Infectious disease screening: wants hep c screen today, ordered.  DEXA: last completed 2015, result normal, follow needed GYN Assistive device: none Oxygen use none Patient has a Dental home. Hospitalizations/ED visits: none Depression screen  PHQ 2/9 08/27/2016 02/07/2016  Decreased Interest 0 0  Down, Depressed, Hopeless 0 0  PHQ - 2 Score 0 0     Immunization History  Administered Date(s) Administered  . Influenza, High Dose Seasonal PF 07/24/2016  . Influenza,inj,Quad PF,36+ Mos 08/01/2015  . Pneumococcal Conjugate-13 07/28/2015  . Pneumococcal Polysaccharide-23 08/27/2016  . Td 05/07/2016     Past Medical History:  Diagnosis Date  . Adnexal cyst 07/2013   Left-3cm-noted on CT abd/pelv--f/u pelvic u/s did not show anything (L ovary not visualized, likely secondary to bowel gas)-repeat pelvic u/s after 07/2014.  . Anemia   . Asthma   . DDD (degenerative disc disease), cervical   . H/O angular cheilitis    Zinc normal, vit B12 low normal (2012)  . H/O vitamin D deficiency 2011   Came back to normal range with replacement therapydd  . History of adenomatous polyp of colon 2003, 2006, 2013;2016   Repeat 2013 showed tubular adenoma x 3, with no high grade dysplasia.  2016 no polyps-recall 5 yrs  . History of hiatal hernia   . Hyperlipidemia    per pt report 05/09/12; also on labs 06/2015.  Intolerant of pravachol 02/2016  . Hypothyroidism   . Kidney infection   . Leg cramps   . Lower leg pain    Bilateral, crampy--ABIs/dopplers NORMAL 04/2013.  . Mechanical back pain   . 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 PONV (postoperative nausea and vomiting)    "jerks" afterward  . Shortness of breath dyspnea    Allergies  Allergen Reactions  .   Codeine Other (See Comments)    Not specified in old records.  Carlton Adam [Propoxyphene N-Acetaminophen] Other (See Comments)    Not specified in old records  . Hydrocodone Other (See Comments)    Not specified in old records  . Valium [Diazepam]     Psychiatric reaction   Past Surgical History:  Procedure Laterality Date  . BAND HEMORRHOIDECTOMY    . CHOLECYSTECTOMY  1980s  . COLONOSCOPY W/ BIOPSIES AND POLYPECTOMY   06/02/12;05/30/15   No polyps 2016-recall 5 yrs.  +internal hem.  Normal ileoscopy.  Marland Kitchen DILATATION & CURETTAGE/HYSTEROSCOPY WITH MYOSURE N/A 05/28/2016   Procedure: DILATATION & CURETTAGE/HYSTEROSCOPY WITH MYOSURE with ULTRASOUND guidance;  Surgeon: Salvadore Dom, MD;  Location: [facility];  Service: Gynecology;  Laterality: N/A;  Please have ultrasound in room. Follow Silva's first case.  Marland Kitchen GANGLION CYST EXCISION  2003   Right wrist, with excision of some triquetrum spurs  . stent placed right kidney    . THYROIDECTOMY, PARTIAL  1972  . TONSILLECTOMY AND ADENOIDECTOMY  age 63  . TUBAL LIGATION    . WRIST SURGERY  2006   For left thumb carpo-metacarpal arthritis   Family History  Problem Relation Age of Onset  . Cancer Mother     ovarian and throat cancer  . Diabetes Mother   . Alcohol abuse Father     alcoholism.  Died age 76  . Cancer Maternal Grandmother     breast cancer   Social History   Social History  . Marital status: Married    Spouse name: N/A  . Number of children: N/A  . Years of education: N/A   Occupational History  . Not on file.   Social History Main Topics  . Smoking status: Never Smoker  . Smokeless tobacco: Never Used  . Alcohol use No  . Drug use: No  . Sexual activity: No   Other Topics Concern  . Not on file   Social History Narrative   Married, 3 children, 6 grandchildren, 1 GGc.   Orig from this region.  Drives a school bus (X 31 yrs).   No T/A/Ds.   Caffeine: one cup coffee a day, 3 glasses of sweet tea per day.   No formal exercise.  She is active (NOT sedentary).                 Medication List       Accurate as of 08/27/16 11:37 AM. Always use your most recent med list.          acetaminophen 325 MG tablet Commonly known as:  TYLENOL Take 650 mg by mouth every 6 (six) hours as needed.   fluticasone 50 MCG/ACT nasal spray Commonly known as:  FLONASE Place 1 spray into both nostrils daily.   levothyroxine 75 MCG  tablet Commonly known as:  SYNTHROID, LEVOTHROID TAKE 1 TABLET DAILY   omeprazole 40 MG capsule Commonly known as:  PRILOSEC Take 1 capsule (40 mg total) by mouth daily.   oxyCODONE-acetaminophen 5-325 MG tablet Commonly known as:  PERCOCET/ROXICET Take by mouth.   phenazopyridine 100 MG tablet Commonly known as:  PYRIDIUM Take 100 mg by mouth 3 (three) times daily as needed for pain.   PROAIR HFA 108 (90 Base) MCG/ACT inhaler Generic drug:  albuterol Inhale 1-2 puffs into the lungs every 6 (six) hours as needed. Reported on 05/15/2016   sucralfate 1 g tablet Commonly known as:  CARAFATE Take 1 tablet (1 g total) by mouth 4 (  four) times daily -  with meals and at bedtime.   triamcinolone 0.025 % cream Commonly known as:  KENALOG Apply 1 application topically 2 (two) times daily.        Recent Results (from the past 2160 hour(s))  CBC w/Diff     Status: Abnormal   Collection Time: 08/09/16 10:48 AM  Result Value Ref Range   WBC 8.5 4.0 - 10.5 K/uL   RBC 4.02 3.87 - 5.11 Mil/uL   Hemoglobin 11.8 (L) 12.0 - 15.0 g/dL   HCT 35.3 (L) 36.0 - 46.0 %   MCV 87.8 78.0 - 100.0 fl   MCHC 33.5 30.0 - 36.0 g/dL   RDW 13.9 11.5 - 15.5 %   Platelets 312.0 150.0 - 400.0 K/uL   Neutrophils Relative % 71.2 43.0 - 77.0 %   Lymphocytes Relative 18.4 12.0 - 46.0 %   Monocytes Relative 8.6 3.0 - 12.0 %   Eosinophils Relative 1.1 0.0 - 5.0 %   Basophils Relative 0.7 0.0 - 3.0 %   Neutro Abs 6.1 1.4 - 7.7 K/uL   Lymphs Abs 1.6 0.7 - 4.0 K/uL   Monocytes Absolute 0.7 0.1 - 1.0 K/uL   Eosinophils Absolute 0.1 0.0 - 0.7 K/uL   Basophils Absolute 0.1 0.0 - 0.1 K/uL  Comp Met (CMET)     Status: Abnormal   Collection Time: 08/09/16 10:48 AM  Result Value Ref Range   Sodium 141 135 - 145 mEq/L   Potassium 4.4 3.5 - 5.1 mEq/L   Chloride 102 96 - 112 mEq/L   CO2 33 (H) 19 - 32 mEq/L   Glucose, Bld 94 70 - 99 mg/dL   BUN 10 6 - 23 mg/dL   Creatinine, Ser 0.84 0.40 - 1.20 mg/dL   Total  Bilirubin 0.6 0.2 - 1.2 mg/dL   Alkaline Phosphatase 67 39 - 117 U/L   AST 16 0 - 37 U/L   ALT 13 0 - 35 U/L   Total Protein 6.9 6.0 - 8.3 g/dL   Albumin 3.7 3.5 - 5.2 g/dL   Calcium 9.3 8.4 - 10.5 mg/dL   GFR 70.68 >60.00 mL/min  Urinalysis, Routine w reflex microscopic     Status: Abnormal   Collection Time: 08/09/16 10:48 AM  Result Value Ref Range   Color, Urine YELLOW Yellow;Lt. Yellow   APPearance Sl Cloudy (A) Clear   Specific Gravity, Urine 1.010 1.000 - 1.030   pH 6.0 5.0 - 8.0   Total Protein, Urine NEGATIVE Negative   Urine Glucose NEGATIVE Negative   Ketones, ur NEGATIVE Negative   Bilirubin Urine NEGATIVE Negative   Hgb urine dipstick TRACE-INTACT (A) Negative   Urobilinogen, UA 0.2 0.0 - 1.0   Leukocytes, UA MODERATE (A) Negative   Nitrite POSITIVE (A) Negative   WBC, UA 21-50/hpf (A) 0-2/hpf   RBC / HPF 3-6/hpf (A) 0-2/hpf   Squamous Epithelial / LPF Rare(0-4/hpf) Rare(0-4/hpf)   Bacteria, UA Many(>50/hpf) (A) None  H. pylori antibody, IgG     Status: None   Collection Time: 08/09/16 10:48 AM  Result Value Ref Range   H Pylori IgG Negative Negative  Urine culture     Status: None   Collection Time: 08/10/16  2:01 PM  Result Value Ref Range   Organism ID, Bacteria      Multiple organisms present,each less than 10,000 CFU/mL. These organisms,commonly found on external and internal genitalia,are considered colonizers. No further testing performed.     No results found.   ROS: 14   pt review of systems performed and negative (unless mentioned in an HPI)  Objective: BP 102/68 (BP Location: Left Arm, Patient Position: Sitting, Cuff Size: Normal)   Pulse 68   Temp 97.9 F (36.6 C) (Oral)   Resp 20   Ht 5' 2" (1.575 m)   Wt 104 lb 12.8 oz (47.5 kg)   SpO2 95%   BMI 19.17 kg/m  Gen: Afebrile. No acute distress. Nontoxic in appearance, well-developed, thin caucasian female.  HENT: AT. [place]. Bilateral TM visualized and normal in appearance, normal external  auditory canal. MMM, no oral lesions, adequate dentition. Bilateral nares within normal limits. Throat without erythema, ulcerations or exudates. no Cough on exam, no hoarseness on exam. Eyes:Pupils Equal Round Reactive to light, Extraocular movements intact,  Conjunctiva without redness, discharge or icterus. Neck/lymp/endocrine: Supple,no lymphadenopathy, no thyromegaly CV: RRR no murmur, no edema, +2/4 P posterior tibialis pulses. no carotid bruits. No JVD. Chest: CTAB, no wheeze, rhonchi or crackles. normal Respiratory effort. fair Air movement. Abd: Soft. flat. ND. Mild TTP right side. BS present. no Masses palpated. No hepatosplenomegaly. No rebound tenderness or guarding. Skin: no rashes, purpura or petechiae. Warm and well-perfused. Skin intact. Neuro/Msk:  Normal gait. PERLA. EOMi. Alert. Oriented x3.  Cranial nerves II through XII intact. Muscle strength 5/5 upper/lower extremity. DTRs equal bilaterally. Psych: Normal affect, dress and demeanor. Normal speech. Normal thought content and judgment.  Assessment/plan: Margaret Holmes is a 74 y.o. female present for CPE. Encounter for preventive health examination Patient was encouraged to exercise greater than 150 minutes a week. Patient was encouraged to choose a diet filled with fresh fruits and vegetables, and lean meats. AVS provided to patient today for education/recommendation on gender specific health and safety maintenance. Colonoscopy: completed 05/2015, by Medoff, resulted internal hemorrhoid. follow up 5 years (for personal h/o polyps). Mammogram: completed:08/2014, birads 1. She has scheduled for a few weeks. Cervical cancer screening: last pap: 2017, she is being followed by GYN.  Immunizations: tdap 08/2016, Influenza 06/2016 (encouraged yearly), PNA series 07/28/2015- PSV23 today, zostavax completed Infectious disease screening: wants hep c screen today, ordered.  DEXA: last completed 2015, result normal, ordered by GYN Thrush:  consider esophagitis/yeast related if it continues and shemay need GI/EGD. Prescribed diflucan x2 and nystatin swish/swallow today  H/O vitamin D deficiency - Vitamin D (25 hydroxy) - Continue vit d at least 800-1000/calcium 1200 mg daily.  Hypothyroidism, unspecified type/Loss of weight - reports copliance with synthroid, however with insomnia and weight loss obvious concern for over replacement.  - refills will be provided after results of labs.  - TSH - T3 - T4, free - Hepatitis C Antibody Encounter for hepatitis C screening test for low risk patient - Hepatitis C Antibody Need for 23-polyvalent pneumococcal polysaccharide vaccine - Pneumococcal polysaccharide vaccine 23-valent greater than or equal to 2yo subcutaneous/IM  CPE 1 year  Electronically signed by: [name] Kuneff, DO [facility]  

## 2016-08-28 ENCOUNTER — Telehealth: Payer: Self-pay | Admitting: Family Medicine

## 2016-08-28 DIAGNOSIS — E559 Vitamin D deficiency, unspecified: Secondary | ICD-10-CM | POA: Insufficient documentation

## 2016-08-28 LAB — T3: T3 TOTAL: 95 ng/dL (ref 76–181)

## 2016-08-28 LAB — HEPATITIS C ANTIBODY: HCV AB: NEGATIVE

## 2016-08-28 MED ORDER — VITAMIN D (ERGOCALCIFEROL) 1.25 MG (50000 UNIT) PO CAPS: 50000.0000 [IU] | ORAL_CAPSULE | ORAL | 0 refills | Status: DC

## 2016-08-28 NOTE — Telephone Encounter (Signed)
Please call Tabytha: Her labs are all normal with the exception of her Vit D. I have called in supplement for her to take once a week for 12 weeks, and then she should continue an OTC supplement of at least 1000 u (if not taking all ready).  We would need to retest her vit d in 12 weeks. Since her labs did not give a reason for her inability to gain weight, she should make an appt to evaluate further if she continues to lose weight.

## 2016-08-28 NOTE — Telephone Encounter (Signed)
Left message with results and detailed instructions on patient voice mail per [facility]

## 2016-09-12 ENCOUNTER — Other Ambulatory Visit: Payer: Self-pay | Admitting: Family Medicine

## 2016-09-14 ENCOUNTER — Telehealth: Payer: Self-pay | Admitting: Family Medicine

## 2016-09-14 MED ORDER — TRIAMCINOLONE ACETONIDE 0.025 % EX CREA: TOPICAL_CREAM | CUTANEOUS | 0 refills | Status: DC

## 2016-09-14 NOTE — Telephone Encounter (Signed)
Triamcinolone ACET Cream USP 0.025% Rx [facility]

## 2016-09-14 NOTE — Telephone Encounter (Signed)
Okay to refill? 

## 2016-09-19 ENCOUNTER — Encounter: Payer: Self-pay | Admitting: Family Medicine

## 2016-09-19 ENCOUNTER — Ambulatory Visit (INDEPENDENT_AMBULATORY_CARE_PROVIDER_SITE_OTHER): Payer: BC Managed Care – PPO | Admitting: Family Medicine

## 2016-09-19 VITALS — Temp 97.9°F | Resp 20 | Ht 62.0 in | Wt 107.5 lb

## 2016-09-19 DIAGNOSIS — R8290 Unspecified abnormal findings in urine: Secondary | ICD-10-CM

## 2016-09-19 DIAGNOSIS — N359 Urethral stricture, unspecified: Secondary | ICD-10-CM

## 2016-09-19 DIAGNOSIS — R42 Dizziness and giddiness: Secondary | ICD-10-CM

## 2016-09-19 DIAGNOSIS — E039 Hypothyroidism, unspecified: Secondary | ICD-10-CM

## 2016-09-19 LAB — POC URINALSYSI DIPSTICK (AUTOMATED)
BILIRUBIN UA: NEGATIVE
GLUCOSE UA: NEGATIVE
KETONES UA: NEGATIVE
Nitrite, UA: NEGATIVE
Protein, UA: NEGATIVE
Urobilinogen, UA: 0.2
pH, UA: 5

## 2016-09-19 MED ORDER — LEVOTHYROXINE SODIUM 75 MCG PO TABS: ORAL_TABLET | ORAL | 3 refills | Status: DC

## 2016-09-19 NOTE — Patient Instructions (Signed)
We will call you once we get results of your kidney function test.  I think the mild dizziness is from you needing more water.   I hope you have a wonderful Thanksgiving.

## 2016-09-19 NOTE — Progress Notes (Signed)
Margaret Holmes , Jan 20, 1941, 75 y.o., female MRN: [id number] Patient Care Team    Relationship Specialty Notifications Start End  [name], DO PCP - General Family Medicine  04/09/16   Richmond Campbell, MD Consulting Physician Gastroenterology  05/23/16   Salvadore Dom, MD Consulting Physician Obstetrics and Gynecology  05/23/16   Leroy Sea, MD Referring Physician Urology  08/27/16     CC: dizziness Subjective:  Pt has felt dizziness about 1-2 times a day since her urological surgery last week for stent placement. She states she is urinating appropriately, maybe more than prior. She denies fever or chills. She endorses mild right lower back pain and dizziness. She has experienced dizziness 1-2 x day since her surgery. 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 She feels she is taking in adequate food and fluids. Dizziness resolves quickly after sitting down, and is worse with bending over. She also endorses painful urination that is unchanged.    Allergies  Allergen Reactions  . Codeine Other (See Comments)    Not specified in old records.  Carlton Adam [Propoxyphene N-Acetaminophen] Other (See Comments)    Not specified in old records  . Hydrocodone Other (See Comments)    Not specified in old records  . Valium [Diazepam]     Psychiatric reaction   Social History  Substance Use Topics  . Smoking status: Never Smoker  . Smokeless tobacco: Never Used  . Alcohol use No   Past Medical History:  Diagnosis Date  . Adnexal cyst 07/2013   Left-3cm-noted on CT abd/pelv--f/u pelvic u/s did not show anything (L ovary not visualized, likely secondary to bowel gas)-repeat pelvic u/s after 07/2014.  Marland Kitchen Anemia   . Asthma   . DDD (degenerative disc disease), cervical   . H/O angular cheilitis    Zinc normal, vit B12 low normal (2012)  . H/O vitamin D deficiency 2011   Came back to normal range with  replacement therapydd  . History of adenomatous polyp of colon 2003, 2006, 2013;2016   Repeat 2013 showed tubular adenoma x 3, with no high grade dysplasia.  2016 no polyps-recall 5 yrs  . History of hiatal hernia   . Hyperlipidemia    per pt report 05/09/12; also on labs 06/2015.  Intolerant of pravachol 02/2016  . Hypothyroidism   . Kidney infection   . Leg cramps   . Lower leg pain    Bilateral, crampy--ABIs/dopplers NORMAL 04/2013.  . Mechanical back pain   . 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 PONV (postoperative nausea and vomiting)    "jerks" afterward  . Shortness of breath dyspnea    Past Surgical History:  Procedure Laterality Date  . BAND HEMORRHOIDECTOMY    . CHOLECYSTECTOMY  1980s  . COLONOSCOPY W/ BIOPSIES AND POLYPECTOMY  06/02/12;05/30/15   No polyps 2016-recall 5 yrs.  +internal hem.  Normal ileoscopy.  Marland Kitchen DILATATION & CURETTAGE/HYSTEROSCOPY WITH MYOSURE N/A 05/28/2016   Procedure: DILATATION & CURETTAGE/HYSTEROSCOPY WITH MYOSURE with ULTRASOUND guidance;  Surgeon: Salvadore Dom, MD;  Location: [facility];  Service: Gynecology;  Laterality: N/A;  Please have ultrasound in room. Follow Silva's first case.  Marland Kitchen GANGLION CYST EXCISION  2003   Right wrist, with excision of some triquetrum spurs  . stent placed right kidney    . THYROIDECTOMY, PARTIAL  1972  . TONSILLECTOMY AND ADENOIDECTOMY  age 68  . TUBAL LIGATION    . WRIST SURGERY  2006   For left thumb carpo-metacarpal arthritis   Family History  Problem Relation Age of Onset  . Cancer Mother     ovarian and throat cancer  . Diabetes Mother   . Alcohol abuse Father     alcoholism.  Died age 78  . Cancer Maternal Grandmother     breast cancer     Medication List       Accurate as of 09/19/16  2:46 PM. Always use your most recent med list.          acetaminophen 325 MG tablet Commonly known as:  TYLENOL Take 650 mg by mouth every 6 (six) hours as needed.     fluticasone 50 MCG/ACT nasal spray Commonly known as:  FLONASE Place 1 spray into both nostrils daily.   levothyroxine 75 MCG tablet Commonly known as:  SYNTHROID, LEVOTHROID TAKE 1 TABLET DAILY   omeprazole 40 MG capsule Commonly known as:  PRILOSEC Take 1 capsule (40 mg total) by mouth daily.   oxyCODONE-acetaminophen 5-325 MG tablet Commonly known as:  PERCOCET/ROXICET Take by mouth.   phenazopyridine 100 MG tablet Commonly known as:  PYRIDIUM Take 100 mg by mouth 3 (three) times daily as needed for pain.   PROAIR HFA 108 (90 Base) MCG/ACT inhaler Generic drug:  albuterol Inhale 1-2 puffs into the lungs every 6 (six) hours as needed. Reported on 05/15/2016   sucralfate 1 g tablet Commonly known as:  CARAFATE Take 1 tablet (1 g total) by mouth 4 (four) times daily -  with meals and at bedtime.   triamcinolone 0.025 % cream Commonly known as:  KENALOG APPLY 1 APPLICATION OF CREAM TOPICALLY TWICE DAILY   Vitamin D (Ergocalciferol) 50000 units Caps capsule Commonly known as:  DRISDOL Take 1 capsule (50,000 Units total) by mouth every 7 (seven) days.       No results found for this or any previous visit (from the past 24 hour(s)). No results found.   ROS: Negative, with the exception of above mentioned in HPI   Objective:  BP (!) 97/57 (BP Location: Right Arm, Patient Position: Sitting, Cuff Size: Normal)   Pulse 68   Temp 97.9 F (36.6 C)   Resp 20   Ht 5\' 2"  (1.575 m)   Wt 107 lb 8 oz (48.8 kg)   SpO2 97%   BMI 19.66 kg/m  Body mass index is 19.66 kg/m.  Orthostatics negative Gen: Afebrile. No acute distress. Nontoxic in appearance, well developed, thin caucasian female.  HENT: AT. [place].  MMM Eyes:Pupils Equal Round Reactive to light, Extraocular movements intact,  Conjunctiva without redness, discharge or icterus. CV: RRR  Chest: CTAB, no wheeze or crackles.   Abd: Soft. NTND. BS present MSK: Mild right CVA tenderness.  Neuro: Normal gait. PERLA.  EOMi. Alert. Oriented x3  Psych: Normal affect, dress and demeanor. Normal speech. Normal thought content and judgment.  Assessment/Plan: Margaret Holmes is a 75 y.o. female present for acute OV for s/p ureteral stent with dizziness.  - Hydrate, stop lifting and doing yard work for another week, no heavy lifting.  - if back pain worsens please be seen immediately in ED.  - urine looks good - orthostatics negative today, blood pressure improved on repeat checks.  - BMP collected, pt reassured if it was a critical lab she would be contacted over the holiday weekend, otherwise she will hear from me Monday.  F/U dependent  on labs.      electronically signed by:  Howard Pouch, DO  [place]

## 2016-09-20 LAB — BASIC METABOLIC PANEL
BUN: 14 mg/dL (ref 7–25)
CHLORIDE: 101 mmol/L (ref 98–110)
CO2: 30 mmol/L (ref 20–31)
Calcium: 9 mg/dL (ref 8.6–10.4)
Creat: 0.84 mg/dL (ref 0.60–0.93)
GLUCOSE: 65 mg/dL (ref 65–99)
Potassium: 3.9 mmol/L (ref 3.5–5.3)
SODIUM: 139 mmol/L (ref 135–146)

## 2016-09-21 LAB — URINE CULTURE: ORGANISM ID, BACTERIA: NO GROWTH

## 2016-09-24 ENCOUNTER — Telehealth: Payer: Self-pay | Admitting: Family Medicine

## 2016-09-24 NOTE — Telephone Encounter (Signed)
Please call pt: - her urine studies are normal and her blood work was normal.

## 2016-09-24 NOTE — Telephone Encounter (Signed)
Patient notified and verbalized understanding. 

## 2016-10-18 ENCOUNTER — Encounter: Payer: Self-pay | Admitting: Obstetrics and Gynecology

## 2016-10-18 ENCOUNTER — Ambulatory Visit (INDEPENDENT_AMBULATORY_CARE_PROVIDER_SITE_OTHER): Payer: BC Managed Care – PPO | Admitting: Obstetrics and Gynecology

## 2016-10-18 VITALS — BP 110/60 | HR 60 | Resp 14 | Ht 62.25 in | Wt 108.0 lb

## 2016-10-18 DIAGNOSIS — Z124 Encounter for screening for malignant neoplasm of cervix: Secondary | ICD-10-CM | POA: Diagnosis not present

## 2016-10-18 DIAGNOSIS — N83202 Unspecified ovarian cyst, left side: Secondary | ICD-10-CM

## 2016-10-18 DIAGNOSIS — Z01419 Encounter for gynecological examination (general) (routine) without abnormal findings: Secondary | ICD-10-CM

## 2016-10-18 NOTE — Progress Notes (Addendum)
75 y.o. [id number] MarriedCaucasianF here for annual exam.   The patient has a stable simple left ovarian cyst, due for f/u imaging next October.  She is getting a ureteral stent out in January. Currently has a sinus infection    No LMP recorded. Patient is postmenopausal.          Sexually active: No.  The current method of family planning is post menopausal status.    Exercising: Yes.    walking Smoker:  no  Health Maintenance: Pap:  She thinks she had a pap last year History of abnormal Pap:  no MMG:  08-30-14 WNL Colonoscopy:  05-30-15 WNL repeat in 5 yrs BMD:   08-30-14 WNL TDaP:  05-07-16 Gardasil: N/A   reports that she has never smoked. She has never used smokeless tobacco. She reports that she does not drink alcohol or use drugs.  Past Medical History:  Diagnosis Date  . Adnexal cyst 07/2013   Left-3cm-noted on CT abd/pelv--f/u pelvic u/s did not show anything (L ovary not visualized, likely secondary to bowel gas)-repeat pelvic u/s after 07/2014.  Marland Kitchen Anemia   . Asthma   . DDD (degenerative disc disease), cervical   . H/O angular cheilitis    Zinc normal, vit B12 low normal (2012)  . H/O vitamin D deficiency 2011   Came back to normal range with replacement therapydd  . History of adenomatous polyp of colon 2003, 2006, 2013;2016   Repeat 2013 showed tubular adenoma x 3, with no high grade dysplasia.  2016 no polyps-recall 5 yrs  . History of hiatal hernia   . Hyperlipidemia    per pt report 05/09/12; also on labs 06/2015.  Intolerant of pravachol 02/2016  . Hypothyroidism   . Kidney infection   . Leg cramps   . Lower leg pain    Bilateral, crampy--ABIs/dopplers NORMAL 04/2013.  . Mechanical back pain   . 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 PONV (postoperative nausea and vomiting)    "jerks" afterward  . Shortness of breath dyspnea     Past Surgical History:  Procedure Laterality Date  . BAND HEMORRHOIDECTOMY    .  CHOLECYSTECTOMY  1980s  . COLONOSCOPY W/ BIOPSIES AND POLYPECTOMY  06/02/12;05/30/15   No polyps 2016-recall 5 yrs.  +internal hem.  Normal ileoscopy.  Marland Kitchen DILATATION & CURETTAGE/HYSTEROSCOPY WITH MYOSURE N/A 05/28/2016   Procedure: DILATATION & CURETTAGE/HYSTEROSCOPY WITH MYOSURE with ULTRASOUND guidance;  Surgeon: Salvadore Dom, MD;  Location: [facility];  Service: Gynecology;  Laterality: N/A;  Please have ultrasound in room. Follow Silva's first case.  Marland Kitchen GANGLION CYST EXCISION  2003   Right wrist, with excision of some triquetrum spurs  . stent placed right kidney    . THYROIDECTOMY, PARTIAL  1972  . TONSILLECTOMY AND ADENOIDECTOMY  age 46  . TUBAL LIGATION    . WRIST SURGERY  2006   For left thumb carpo-metacarpal arthritis    Current Outpatient Prescriptions  Medication Sig Dispense Refill  . acetaminophen (TYLENOL) 325 MG tablet Take 650 mg by mouth every 6 (six) hours as needed.    . fluticasone (FLONASE) 50 MCG/ACT nasal spray Place 1 spray into both nostrils daily.    Marland Kitchen levothyroxine (SYNTHROID, LEVOTHROID) 75 MCG tablet TAKE 1 TABLET DAILY 90 tablet 3  . PROAIR HFA 108 (90 Base) MCG/ACT inhaler Inhale 1-2 puffs into the lungs every 6 (six) hours as needed. Reported on 05/15/2016    . triamcinolone (KENALOG) 0.025 %  cream APPLY 1 APPLICATION OF CREAM TOPICALLY TWICE DAILY 30 g 0  . Vitamin D, Ergocalciferol, (DRISDOL) 50000 units CAPS capsule Take 1 capsule (50,000 Units total) by mouth every 7 (seven) days. 12 capsule 0   No current facility-administered medications for this visit.     Family History  Problem Relation Age of Onset  . Cancer Mother     ovarian and throat cancer  . Diabetes Mother   . Alcohol abuse Father     alcoholism.  Died age 74  . Cancer Maternal Grandmother     breast cancer    Review of Systems  Constitutional: Positive for chills.  HENT: Positive for congestion, nosebleeds, sinus pain and sinus pressure.   Eyes: Negative.   Respiratory: Negative.    Cardiovascular: Negative.   Gastrointestinal: Negative.   Endocrine: Negative.   Genitourinary: Negative.        Loss of sexual interest   Musculoskeletal: Negative.   Skin: Negative.   Allergic/Immunologic: Negative.   Neurological: Positive for headaches.  Hematological: Bruises/bleeds easily.  Psychiatric/Behavioral: Negative.     Exam:   BP 110/60 (BP Location: Right Arm, Patient Position: Sitting, Cuff Size: Normal)   Pulse 60   Resp 14   Ht 5' 2.25" (1.581 m)   Wt 108 lb (49 kg)   BMI 19.60 kg/m   Weight change: @WEIGHTCHANGE @ Height:   Height: 5' 2.25" (158.1 cm)  Ht Readings from Last 3 Encounters:  10/18/16 5' 2.25" (1.581 m)  09/19/16 5\' 2"  (1.575 m)  08/27/16 5\' 2"  (1.575 m)    General appearance: alert, cooperative and appears stated age Head: Normocephalic, without obvious abnormality, atraumatic Neck: no adenopathy, supple, symmetrical, trachea midline and thyroid normal to inspection and palpation Lungs: clear to auscultation bilaterally Cardiovascular: regular rate and rhythm Breasts: normal appearance, no masses or tenderness Heart: regular rate and rhythm Abdomen: soft, non-tender; bowel sounds normal; no masses,  no organomegaly Extremities: extremities normal, atraumatic, no cyanosis or edema Skin: Skin color, texture, turgor normal. No rashes or lesions Lymph nodes: Cervical, supraclavicular, and axillary nodes normal. No abnormal inguinal nodes palpated Neurologic: Grossly normal   Pelvic: External genitalia:  no lesions              Urethra:  normal appearing urethra with no masses, tenderness or lesions              Bartholins and Skenes: normal                 Vagina: normal appearing vagina with normal color and discharge, no lesions              Cervix: unable to clearly see her cervix, she has scaring in her upper vagina. Able to feel her cervix.                Bimanual Exam:  Uterus:  normal size, contour, position, consistency,  mobility, non-tender              Adnexa: no mass, fullness, tenderness               Rectovaginal: Confirms               Anus:  normal sphincter tone, no lesions  Chaperone was present for exam.  A:  Well Woman with normal exam  H/o left ovarian cyst  P:   Has mammogram and DEXA tomorrow  Pap with reflex hpv  Discussed breast self exam  Discussed calcium and vit D  intake  Labs with primary MD  Will get a copy of her prior GYN records  11/28/16 Addendum: Old records reviewed

## 2016-10-19 ENCOUNTER — Telehealth: Payer: Self-pay | Admitting: *Deleted

## 2016-10-19 ENCOUNTER — Ambulatory Visit
Admission: RE | Admit: 2016-10-19 | Discharge: 2016-10-19 | Disposition: A | Discharge status: Home / Self Care | Payer: BC Managed Care – PPO | Source: Ambulatory Visit | Attending: Obstetrics and Gynecology | Admitting: Obstetrics and Gynecology

## 2016-10-19 DIAGNOSIS — Z1231 Encounter for screening mammogram for malignant neoplasm of breast: Secondary | ICD-10-CM

## 2016-10-19 DIAGNOSIS — E2839 Other primary ovarian failure: Secondary | ICD-10-CM

## 2016-10-19 NOTE — Telephone Encounter (Signed)
Left message to call Natalynn Pedone at [phone].  

## 2016-10-19 NOTE — Telephone Encounter (Signed)
Patient returning your call.

## 2016-10-19 NOTE — Telephone Encounter (Signed)
Return call to patient. Patient notified of normal BMD results as directed by Dr Talbert Nan. Instructed to continue with 1200 mg calcium daily and is currently taking vit D 50,000 IU weekly from PCP. Advised to continue this as they prescribe. Repeat BMD in 10 years.  Encounter closed.

## 2016-10-19 NOTE — Telephone Encounter (Signed)
-----   Message from Salvadore Dom, MD sent at 10/19/2016 11:47 AM EST ----- Please inform the patient that her DEXA was normal. F/U dexa in 10 years given normal results. It is recommended that she get 1,200 mg of calcium a day with vit D (either in her diet or supplements). She should exercise regularly.

## 2016-10-23 LAB — IPS PAP TEST WITH REFLEX TO HPV

## 2016-10-24 ENCOUNTER — Ambulatory Visit (INDEPENDENT_AMBULATORY_CARE_PROVIDER_SITE_OTHER): Payer: BC Managed Care – PPO | Admitting: Family Medicine

## 2016-10-24 ENCOUNTER — Encounter: Payer: Self-pay | Admitting: Family Medicine

## 2016-10-24 VITALS — BP 107/64 | HR 70 | Temp 98.0°F | Resp 20 | Wt 110.5 lb

## 2016-10-24 DIAGNOSIS — J0100 Acute maxillary sinusitis, unspecified: Secondary | ICD-10-CM

## 2016-10-24 MED ORDER — DOXYCYCLINE HYCLATE 100 MG PO TABS: 100.0000 mg | ORAL_TABLET | Freq: Two times a day (BID) | ORAL | 0 refills | Status: DC

## 2016-10-24 NOTE — Patient Instructions (Addendum)
Rest and hydrate  I have called in doxycyline for you to start every 12 hours for 10 days.  Nasal saline.   Please help [place] help you:  It is a privilege to be able to take care of great patients such as yourself. We are honored you have chosen [facility] for your Primary Care home. Below you will find basic instructions that you may need to access in the future. Please help [place] help you by reading the instructions, which cover many of the frequent questions we experience.   Prescription refills and request:  -In order to allow more efficient response time, please call your pharmacy for all refills. They will forward the request electronically to [place]. This allows for the quickest possible response. Request left on a nurse line can take longer to refill, since these are checked as time allows between office patients and other phone calls.  - refill request can take up to 3-5 working days to complete.  - If request is sent electronically and request is appropiate, it is usually completed in 1-2 business days.  - all patients will need to be seen routinely for all chronic medical conditions requiring prescription medications (see follow-up below). If you are overdue for follow up on your condition, you will be asked to make an appointment and we will call in enough medication to cover you until your appointment (up to 30 days).  - all controlled substances will require a face to face visit to request/refill.  - if you desire your prescriptions to go through a new pharmacy, and have an active script at original pharmacy, you will need to call your pharmacy and have scripts transferred to new pharmacy. This is completed between the pharmacy locations and not by your provider.    Results: If any images or labs were ordered, it can take up to 1 week to get results depending on the test ordered and the lab/facility running and resulting the test. - Normal or stable results, which do not need further  discussion, will be released to your mychart immediately with attached note to you. A call will not be generated for normal results. Please make certain to sign up for mychart. If you have questions on how to activate your mychart you can call the front office.  - If your results need further discussion, our office will attempt to contact you via phone, and if unable to reach you after 2 attempts, we will release your abnormal result to your mychart with instructions.  - All results will be automatically released in mychart after 1 week.  - Your provider will provide you with explanation and instruction on all relevant material in your results. Please keep in mind, results and labs may appear confusing or abnormal to the untrained eye, but it does not mean they are actually abnormal for you personally. If you have any questions about your results that are not covered, or you desire more detailed explanation than what was provided, you should make an appointment with your provider to do so.   Our office handles many outgoing and incoming calls daily. If we have not contacted you within 1 week about your results, please check your mychart to see if there is a message first and if not, then contact our office.  In helping with this matter, you help decrease call volume, and therefore allow [place] to be able to respond to patients needs more efficiently.   Acute office visits (sick visit):  An  acute visit is intended for a new problem and are scheduled in shorter time slots to allow schedule openings for patients with new problems. This is the appropriate visit to discuss a new problem. In order to provide you with excellent quality medical care with proper time for you to explain your problem, have an exam and receive treatment with instructions, these appointments should be limited to one new problem per visit. If you experience a new problem, in which you desire to be addressed, please make an acute office visit,  we save openings on the schedule to accommodate you. Please do not save your new problem for any other type of visit, let us take care of it properly and quickly for you.   Follow up visits:  Depending on your condition(s) your provider will need to see you routinely in order to provide you with quality care and prescribe medication(s). Most chronic conditions (Example: hypertension, Diabetes, depression/anxiety... etc), require visits a couple times a year. Your provider will instruct you on proper follow up for your personal medical conditions and history. Please make certain to make follow up appointments for your condition as instructed. Failing to do so could result in lapse in your medication treatment/refills. If you request a refill, and are overdue to be seen on a condition, we will always provide you with a 30 day script (once) to allow you time to schedule.    Medicare wellness (well visit): - we have a wonderful Nurse Maudie Mercury), that will meet with you and provide you will yearly medicare wellness visits. These visits should occur yearly (can not be scheduled less than 1 calendar year apart) and cover preventive health, immunizations, advance directives and screenings you are entitled to yearly through your medicare benefits. Do not miss out on your entitled benefits, this is when medicare will pay for these benefits to be ordered for you.  These are strongly encouraged by your provider and is the appropriate type of visit to make certain you are up to date with all preventive health benefits. If you have not had your medicare wellness exam in the last 12 months, please make certain to schedule one by calling the office and schedule your medicare wellness with Maudie Mercury as soon as possible.   Yearly physical (well visit):  - Adults are recommended to be seen yearly for physicals. Check with your insurance and date of your last physical, most insurances require one calendar year between physicals.  Physicals include all preventive health topics, screenings, medical exam and labs that are appropriate for gender/age and history. You may have fasting labs needed at this visit. This is a well visit (not a sick visit), acute topics should not be covered during this visit.  - Pediatric patients are seen more frequently when they are younger. Your provider will advise you on well child visit timing that is appropriate for your their age. - This is not a medicare wellness visit. Medicare wellness exams do not have an exam portion to the visit. Some medicare companies allow for a physical, some do not allow a yearly physical. If your medicare allows a yearly physical you can schedule the medicare wellness with our nurse Maudie Mercury and have your physical with your provider after, on the same day. Please check with insurance for your full benefits.   Late Policy/No Shows:  - all new patients should arrive 15-30 minutes earlier than appointment to allow [place] time  to  obtain all personal demographics,  insurance information and for  you to complete office paperwork. - All established patients should arrive 10-15 minutes earlier than appointment time to update all information and be checked in .  - In our best efforts to run on time, if you are late for your appointment you will be asked to either reschedule or if able, we will work you back into the schedule. There will be a wait time to work you back in the schedule,  depending on availability.  - If you are unable to make it to your appointment as scheduled, please call 24 hours ahead of time to allow [place] to fill the time slot with someone else who needs to be seen. If you do not cancel your appointment ahead of time, you may be charged a no show fee.

## 2016-10-24 NOTE — Progress Notes (Signed)
Margaret Holmes , 1941-05-04, 75 y.o., female MRN: [id number] Patient Care Team    Relationship Specialty Notifications Start End  [name], DO PCP - General Family Medicine  04/09/16   Richmond Campbell, MD Consulting Physician Gastroenterology  05/23/16   Salvadore Dom, MD Consulting Physician Obstetrics and Gynecology  05/23/16   Leroy Sea, MD Referring Physician Urology  08/27/16     CC: URI Subjective: Pt presents for an acute OV with complaints of cough of 10 days duration.  Associated symptoms include nasal congestion, ear pressure, sinus pressure, headache, mild sore throat, bloody nose, fatigue and chills. She has tried mucinex and OTC therapy.    Allergies  Allergen Reactions  . Codeine Other (See Comments)    Not specified in old records.  Carlton Adam [Propoxyphene N-Acetaminophen] Other (See Comments)    Not specified in old records  . Hydrocodone Other (See Comments)    Not specified in old records  . Valium [Diazepam]     Psychiatric reaction   Social History  Substance Use Topics  . Smoking status: Never Smoker  . Smokeless tobacco: Never Used  . Alcohol use No   Past Medical History:  Diagnosis Date  . Adnexal cyst 07/2013   Left-3cm-noted on CT abd/pelv--f/u pelvic u/s did not show anything (L ovary not visualized, likely secondary to bowel gas)-repeat pelvic u/s after 07/2014.  Marland Kitchen Anemia   . Asthma   . DDD (degenerative disc disease), cervical   . H/O angular cheilitis    Zinc normal, vit B12 low normal (2012)  . H/O vitamin D deficiency 2011   Came back to normal range with replacement therapydd  . History of adenomatous polyp of colon 2003, 2006, 2013;2016   Repeat 2013 showed tubular adenoma x 3, with no high grade dysplasia.  2016 no polyps-recall 5 yrs  . History of hiatal hernia   . Hyperlipidemia    per pt report 05/09/12; also on labs 06/2015.  Intolerant of pravachol 02/2016  . Hypothyroidism   . Kidney infection   . Leg cramps     . Lower leg pain    Bilateral, crampy--ABIs/dopplers NORMAL 04/2013.  . Mechanical back pain   . 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 PONV (postoperative nausea and vomiting)    "jerks" afterward  . Shortness of breath dyspnea    Past Surgical History:  Procedure Laterality Date  . BAND HEMORRHOIDECTOMY    . CHOLECYSTECTOMY  1980s  . COLONOSCOPY W/ BIOPSIES AND POLYPECTOMY  06/02/12;05/30/15   No polyps 2016-recall 5 yrs.  +internal hem.  Normal ileoscopy.  Marland Kitchen DILATATION & CURETTAGE/HYSTEROSCOPY WITH MYOSURE N/A 05/28/2016   Procedure: DILATATION & CURETTAGE/HYSTEROSCOPY WITH MYOSURE with ULTRASOUND guidance;  Surgeon: Salvadore Dom, MD;  Location: [facility];  Service: Gynecology;  Laterality: N/A;  Please have ultrasound in room. Follow Silva's first case.  Marland Kitchen GANGLION CYST EXCISION  2003   Right wrist, with excision of some triquetrum spurs  . stent placed right kidney    . THYROIDECTOMY, PARTIAL  1972  . TONSILLECTOMY AND ADENOIDECTOMY  age 89  . TUBAL LIGATION    . WRIST SURGERY  2006   For left thumb carpo-metacarpal arthritis   Family History  Problem Relation Age of Onset  . Cancer Mother     ovarian and throat cancer  . Diabetes Mother   . Alcohol abuse Father     alcoholism.  Died age 68  .  Cancer Maternal Grandmother     breast cancer   Allergies as of 10/24/2016      Reactions   Codeine Other (See Comments)   Not specified in old records.   Darvocet [propoxyphene N-acetaminophen] Other (See Comments)   Not specified in old records   Hydrocodone Other (See Comments)   Not specified in old records   Valium [diazepam]    Psychiatric reaction      Medication List       Accurate as of 10/24/16  4:13 PM. Always use your most recent med list.          acetaminophen 325 MG tablet Commonly known as:  TYLENOL Take 650 mg by mouth every 6 (six) hours as needed.   fluticasone 50 MCG/ACT nasal spray Commonly known as:   FLONASE Place 1 spray into both nostrils daily.   levothyroxine 75 MCG tablet Commonly known as:  SYNTHROID, LEVOTHROID TAKE 1 TABLET DAILY   PROAIR HFA 108 (90 Base) MCG/ACT inhaler Generic drug:  albuterol Inhale 1-2 puffs into the lungs every 6 (six) hours as needed. Reported on 05/15/2016   triamcinolone 0.025 % cream Commonly known as:  KENALOG APPLY 1 APPLICATION OF CREAM TOPICALLY TWICE DAILY   Vitamin D (Ergocalciferol) 50000 units Caps capsule Commonly known as:  DRISDOL Take 1 capsule (50,000 Units total) by mouth every 7 (seven) days.       No results found for this or any previous visit (from the past 24 hour(s)). No results found.   ROS: Negative, with the exception of above mentioned in HPI   Objective:  BP 107/64 (BP Location: Left Arm, Patient Position: Sitting, Cuff Size: Normal)   Pulse 70   Temp 98 F (36.7 C)   Resp 20   Wt 110 lb 8 oz (50.1 kg)   SpO2 95%   BMI 20.05 kg/m  Body mass index is 20.05 kg/m. Gen: Afebrile. No acute distress. Nontoxic in appearance, well developed, well nourished.  HENT: AT. [place]. Bilateral TM visualized. MMM, no oral lesions. Bilateral nares with erythema, drainage. Throat without erythema or exudates. Cough, hoarseness, TTP max sinus.  Eyes:Pupils Equal Round Reactive to light, Extraocular movements intact,  Conjunctiva without redness, discharge or icterus. Neck/lymp/endocrine: Supple,bilateral ant cervical  lymphadenopathy CV: RRR  Chest: CTAB, no wheeze or crackles. Good air movement, normal resp effort.  Abd: Soft. NTND. BS present. Neuro: Normal gait. PERLA. EOMi. Alert. Oriented x3   Assessment/Plan: Margaret Holmes is a 75 y.o. female present for acute OV for  Acute maxillary sinusitis, recurrence not specified Rest, hydrate, nasal saline.  Doxycyline BID F/U as needed, sooner if not improving within 2 weeks.    electronically signed by:  Howard Pouch, DO  [place]

## 2016-11-22 ENCOUNTER — Other Ambulatory Visit (INDEPENDENT_AMBULATORY_CARE_PROVIDER_SITE_OTHER): Payer: BC Managed Care – PPO

## 2016-11-22 DIAGNOSIS — E559 Vitamin D deficiency, unspecified: Secondary | ICD-10-CM

## 2016-11-22 LAB — VITAMIN D 25 HYDROXY (VIT D DEFICIENCY, FRACTURES): VITD: 40.88 ng/mL (ref 30.00–100.00)

## 2016-11-23 ENCOUNTER — Telehealth: Payer: Self-pay | Admitting: Family Medicine

## 2016-11-23 NOTE — Telephone Encounter (Signed)
Please call pt: - her vit d looks great. I would recommend she continue taking 1000-2000u daily of the over the counter supplement.

## 2016-11-23 NOTE — Telephone Encounter (Signed)
Patient notified and verbalized understanding. 

## 2016-11-23 NOTE — Telephone Encounter (Signed)
Left message on voicemail for patient to return call. 

## 2017-04-19 ENCOUNTER — Ambulatory Visit (INDEPENDENT_AMBULATORY_CARE_PROVIDER_SITE_OTHER): Payer: BC Managed Care – PPO | Admitting: Family Medicine

## 2017-04-19 ENCOUNTER — Encounter: Payer: Self-pay | Admitting: Family Medicine

## 2017-04-19 VITALS — BP 153/84 | HR 70 | Temp 97.5°F | Resp 20 | Ht 62.0 in | Wt 111.5 lb

## 2017-04-19 DIAGNOSIS — R51 Headache: Secondary | ICD-10-CM

## 2017-04-19 DIAGNOSIS — I10 Essential (primary) hypertension: Secondary | ICD-10-CM | POA: Diagnosis not present

## 2017-04-19 DIAGNOSIS — R42 Dizziness and giddiness: Secondary | ICD-10-CM | POA: Diagnosis not present

## 2017-04-19 DIAGNOSIS — R519 Headache, unspecified: Secondary | ICD-10-CM | POA: Insufficient documentation

## 2017-04-19 LAB — CBC
HEMATOCRIT: 38.3 % (ref 36.0–46.0)
HEMOGLOBIN: 12.9 g/dL (ref 12.0–15.0)
MCHC: 33.7 g/dL (ref 30.0–36.0)
MCV: 90.8 fl (ref 78.0–100.0)
Platelets: 226 10*3/uL (ref 150.0–400.0)
RBC: 4.21 Mil/uL (ref 3.87–5.11)
RDW: 13.7 % (ref 11.5–15.5)
WBC: 7.1 10*3/uL (ref 4.0–10.5)

## 2017-04-19 LAB — BASIC METABOLIC PANEL
BUN: 15 mg/dL (ref 6–23)
CHLORIDE: 103 meq/L (ref 96–112)
CO2: 30 meq/L (ref 19–32)
CREATININE: 0.88 mg/dL (ref 0.40–1.20)
Calcium: 9.6 mg/dL (ref 8.4–10.5)
GFR: 66.85 mL/min (ref >60.00)
GLUCOSE: 84 mg/dL (ref 70–99)
POTASSIUM: 4.6 meq/L (ref 3.5–5.1)
Sodium: 140 mEq/L (ref 135–145)

## 2017-04-19 NOTE — Patient Instructions (Signed)
I will check electrolytes for you.  In the meantime, when getting up from a flat position, sit on the side of the bed for 2-3 minutes.  I will order carotid studies also to make sure this is not part of the problem as well.    Return for this issue only if worsening or does not improve with recommnedations.

## 2017-04-19 NOTE — Progress Notes (Signed)
Margaret Holmes , [birth year], 76 y.o., female MRN: [id number] Patient Care Team    Relationship Specialty Notifications Start End  [name], DO PCP - General Family Medicine  04/09/16   Richmond Campbell, MD Consulting Physician Gastroenterology  05/23/16   Salvadore Dom, MD Consulting Physician Obstetrics and Gynecology  05/23/16   Leroy Sea, MD Referring Physician Urology  08/27/16     Chief Complaint  Patient presents with  . Headache    every am      Subjective:  Headache and lightheadedness:  Pt presents today for morning headache and lightheadedness since winter time. She states when she first gets up in the morning (4:30 am) she jumps up to turn off alarm and notices a headache and lightheaded. She states it feels like she is going to pass out. She sometimes has to grab onto furniture to prevent falling. She denies syncope or falls. She does not sleep well, wakes every few hours and symptoms are not present if she gets out of bed at those times, but is a more gradual position change during those times. She denies any bleeding, fever, chills, diarrhea, palpitations or chest pain. The symptoms last a few minutes until she is "up moving around a bit" and they both self resolve. Sometimes, she needs to take a tylenol. She reports she had her eyes checked and they are fine. She also endorses continued leg cramps in the morning. She has tried to cut back on caffeine and drink more water. Sh eis up to about 50-60 ounces of water a day, 1 cup of coffee and sometimes 8 ounces of soda in the afternoon if needing more energy. She has had no additional weight loss and has gained a pound. Her TSH was normal 07/2016 and she reports compliance with meds. Her cholesterol was mildl y elevated 2 years ago, but she states she made changes that brought it down and never took the medication prescribed. Lipids 01/2016 were great. She has never smoked. Colonoscopy UTD 2016.   Depression screen  [facility] 2/9 08/27/2016 02/07/2016  Decreased Interest 0 0  Down, Depressed, Hopeless 0 0  PHQ - 2 Score 0 0    Allergies  Allergen Reactions  . Codeine Other (See Comments)    Not specified in old records.  Carlton Adam [Propoxyphene N-Acetaminophen] Other (See Comments)    Not specified in old records  . Hydrocodone Other (See Comments)    Not specified in old records  . Valium [Diazepam]     Psychiatric reaction   Social History  Substance Use Topics  . Smoking status: Never Smoker  . Smokeless tobacco: Never Used  . Alcohol use No   Past Medical History:  Diagnosis Date  . Adnexal cyst 07/2013   Left-3cm-noted on CT abd/pelv--f/u pelvic u/s did not show anything (L ovary not visualized, likely secondary to bowel gas)-repeat pelvic u/s after 07/2014.  Marland Kitchen Anemia   . Asthma   . DDD (degenerative disc disease), cervical   . H/O angular cheilitis    Zinc normal, vit B12 low normal (2012)  . H/O vitamin D deficiency 2011   Came back to normal range with replacement therapydd  . History of adenomatous polyp of colon 2003, 2006, 2013;2016   Repeat 2013 showed tubular adenoma x 3, with no high grade dysplasia.  2016 no polyps-recall 5 yrs  . History of hiatal hernia   . Hyperlipidemia    per pt report 05/09/12; also on labs 06/2015.  Intolerant of pravachol 02/2016  . Hypothyroidism   . Kidney infection   . Lower leg pain    Bilateral, crampy--ABIs/dopplers NORMAL 04/2013.  . 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 PONV (postoperative nausea and vomiting)    "jerks" afterward  . Shortness of breath dyspnea    Past Surgical History:  Procedure Laterality Date  . BAND HEMORRHOIDECTOMY    . CHOLECYSTECTOMY  1980s  . COLONOSCOPY W/ BIOPSIES AND POLYPECTOMY  06/02/12;05/30/15   No polyps 2016-recall 5 yrs.  +internal hem.  Normal ileoscopy.  Marland Kitchen DILATATION & CURETTAGE/HYSTEROSCOPY WITH MYOSURE N/A 05/28/2016   Procedure: DILATATION &  CURETTAGE/HYSTEROSCOPY WITH MYOSURE with ULTRASOUND guidance;  Surgeon: Salvadore Dom, MD;  Location: [facility];  Service: Gynecology;  Laterality: N/A;  Please have ultrasound in room. Follow Silva's first case.  Marland Kitchen GANGLION CYST EXCISION  2003   Right wrist, with excision of some triquetrum spurs  . stent placed right kidney    . THYROIDECTOMY, PARTIAL  1972  . TONSILLECTOMY AND ADENOIDECTOMY  age 31  . TUBAL LIGATION    . WRIST SURGERY  2006   For left thumb carpo-metacarpal arthritis   Family History  Problem Relation Age of Onset  . Cancer Mother        ovarian and throat cancer  . Diabetes Mother   . Alcohol abuse Father        alcoholism.  Died age 54  . Cancer Maternal Grandmother        breast cancer   Allergies as of 04/19/2017      Reactions   Codeine Other (See Comments)   Not specified in old records.   Darvocet [propoxyphene N-acetaminophen] Other (See Comments)   Not specified in old records   Hydrocodone Other (See Comments)   Not specified in old records   Valium [diazepam]    Psychiatric reaction      Medication List       Accurate as of 04/19/17 11:05 AM. Always use your most recent med list.          acetaminophen 325 MG tablet Commonly known as:  TYLENOL Take 650 mg by mouth every 6 (six) hours as needed.   fluticasone 50 MCG/ACT nasal spray Commonly known as:  FLONASE Place 1 spray into both nostrils daily.   levothyroxine 75 MCG tablet Commonly known as:  SYNTHROID, LEVOTHROID TAKE 1 TABLET DAILY   PROAIR HFA 108 (90 Base) MCG/ACT inhaler Generic drug:  albuterol Inhale 1-2 puffs into the lungs every 6 (six) hours as needed. Reported on 05/15/2016   triamcinolone 0.025 % cream Commonly known as:  KENALOG APPLY 1 APPLICATION OF CREAM TOPICALLY TWICE DAILY       All past medical history, surgical history, allergies, family history, immunizations andmedications were updated in the EMR today and reviewed under the history and medication  portions of their EMR.     ROS: Negative, with the exception of above mentioned in HPI   Objective:  BP (!) 153/84 (BP Location: Right Arm, Cuff Size: Small)   Pulse 70   Temp 97.5 F (36.4 C)   Resp 20   Ht 5\' 2"  (1.575 m)   Wt 111 lb 8 oz (50.6 kg)   SpO2 100%   BMI 20.39 kg/m  Body mass index is 20.39 kg/m.  Orthostatics positive for increase in BP by 20 mmHg. Gen: Afebrile. No acute distress. Nontoxic in appearance, well developed, well nourished. Very thin,  pleasant caucasian female.  HENT: AT. [place].MMM, no oral lesions.  Eyes:Pupils Equal Round Reactive to light, Extraocular movements intact,  Conjunctiva without redness, discharge or icterus. Neck/lymp/endocrine: Supple CV: RRR no murmur, no edema, no carotid bruits appreciated.  Chest: CTAB, no wheeze or crackles. Good air movement, normal resp effort.  Neuro: Normal gait. PERLA. EOMi. Alert. Oriented x3   No exam data present No results found. No results found for this or any previous visit (from the past 24 hour(s)).  Assessment/Plan: Margaret Holmes is a 76 y.o. female present for OV for  Lightheaded Nonintractable headache, unspecified chronicity pattern, unspecified headache type orthostatic hypertension - pt with a significant h/o anemia. Orthostatics showed an increase in BP 20 mmHg. - recommended to continue working on her hydration (60-70 ounces water a day). Will check labs today for worsening anemia, electrolytes and check carotids given headache and lightlessness.  - pt encouraged to sit on the sode of the bed for 2-3 minutes before standing.  - Basic Metabolic Panel (BMET) - CBC - US Carotid Duplex Bilateral; Future - F/U 2 weeks if recommendations do not improve symptoms are studies indicate need.   Reviewed expectations re: course of current medical issues.  Discussed self-management of symptoms.  Outlined signs and symptoms indicating need for more acute intervention.  Patient verbalized  understanding and all questions were answered.  Patient received an After-Visit Summary.   Note is dictated utilizing voice recognition software. Although note has been proof read prior to signing, occasional typographical errors still can be missed. If any questions arise, please do not hesitate to call for verification.   electronically signed by:  Howard Pouch, DO  [place]

## 2017-04-22 ENCOUNTER — Telehealth: Payer: Self-pay | Admitting: Family Medicine

## 2017-04-22 NOTE — Telephone Encounter (Signed)
Please call pt: - her kidney function is normal and she is not anemic, labs are stable from prior collections.

## 2017-04-22 NOTE — Telephone Encounter (Signed)
Spoke with patient reviewed lab results. Patient verbalized understanding of all.

## 2017-04-29 ENCOUNTER — Other Ambulatory Visit (HOSPITAL_BASED_OUTPATIENT_CLINIC_OR_DEPARTMENT_OTHER): Payer: BC Managed Care – PPO

## 2017-05-03 ENCOUNTER — Telehealth: Payer: Self-pay | Admitting: Family Medicine

## 2017-05-03 ENCOUNTER — Ambulatory Visit (HOSPITAL_BASED_OUTPATIENT_CLINIC_OR_DEPARTMENT_OTHER)
Admission: RE | Admit: 2017-05-03 | Discharge: 2017-05-03 | Disposition: A | Discharge status: Home / Self Care | Payer: BC Managed Care – PPO | Source: Ambulatory Visit | Attending: Family Medicine | Admitting: Family Medicine

## 2017-05-03 DIAGNOSIS — I6521 Occlusion and stenosis of right carotid artery: Secondary | ICD-10-CM | POA: Diagnosis not present

## 2017-05-03 DIAGNOSIS — R519 Headache, unspecified: Secondary | ICD-10-CM

## 2017-05-03 DIAGNOSIS — R51 Headache: Secondary | ICD-10-CM | POA: Insufficient documentation

## 2017-05-03 DIAGNOSIS — R42 Dizziness and giddiness: Secondary | ICD-10-CM

## 2017-05-03 NOTE — Telephone Encounter (Signed)
Please call pt: - her carotid dopplers do not show cause for her lightheaded/dizziness. They were stable.

## 2017-05-03 NOTE — Telephone Encounter (Signed)
Spoke with patient reviewed results.

## 2017-07-13 ENCOUNTER — Other Ambulatory Visit: Payer: Self-pay | Admitting: Family Medicine

## 2017-08-08 ENCOUNTER — Ambulatory Visit (INDEPENDENT_AMBULATORY_CARE_PROVIDER_SITE_OTHER): Payer: BC Managed Care – PPO

## 2017-08-08 DIAGNOSIS — Z23 Encounter for immunization: Secondary | ICD-10-CM

## 2017-08-30 ENCOUNTER — Ambulatory Visit (INDEPENDENT_AMBULATORY_CARE_PROVIDER_SITE_OTHER): Payer: BC Managed Care – PPO | Admitting: Family Medicine

## 2017-08-30 ENCOUNTER — Encounter: Payer: Self-pay | Admitting: Family Medicine

## 2017-08-30 VITALS — BP 92/57 | HR 67 | Temp 98.0°F | Resp 20 | Ht 62.0 in | Wt 113.5 lb

## 2017-08-30 DIAGNOSIS — Z0000 Encounter for general adult medical examination without abnormal findings: Secondary | ICD-10-CM

## 2017-08-30 DIAGNOSIS — E039 Hypothyroidism, unspecified: Secondary | ICD-10-CM | POA: Diagnosis not present

## 2017-08-30 DIAGNOSIS — E7849 Other hyperlipidemia: Secondary | ICD-10-CM

## 2017-08-30 DIAGNOSIS — R319 Hematuria, unspecified: Secondary | ICD-10-CM | POA: Diagnosis not present

## 2017-08-30 DIAGNOSIS — E559 Vitamin D deficiency, unspecified: Secondary | ICD-10-CM | POA: Diagnosis not present

## 2017-08-30 LAB — POC URINALSYSI DIPSTICK (AUTOMATED)
BILIRUBIN UA: NEGATIVE
GLUCOSE UA: NEGATIVE
KETONES UA: NEGATIVE
NITRITE UA: POSITIVE
Protein, UA: 100
Spec Grav, UA: 1.02 (ref 1.010–1.025)
Urobilinogen, UA: 0.2 E.U./dL
pH, UA: 5.5 (ref 5.0–8.0)

## 2017-08-30 LAB — CBC WITH DIFFERENTIAL/PLATELET
BASOS ABS: 0 10*3/uL (ref 0.0–0.1)
BASOS PCT: 0.5 % (ref 0.0–3.0)
EOS ABS: 0.2 10*3/uL (ref 0.0–0.7)
Eosinophils Relative: 2.7 % (ref 0.0–5.0)
HEMATOCRIT: 37.9 % (ref 36.0–46.0)
Hemoglobin: 12.5 g/dL (ref 12.0–15.0)
LYMPHS ABS: 1.9 10*3/uL (ref 0.7–4.0)
Lymphocytes Relative: 22.7 % (ref 12.0–46.0)
MCHC: 33 g/dL (ref 30.0–36.0)
MCV: 92.7 fl (ref 78.0–100.0)
Monocytes Absolute: 0.8 10*3/uL (ref 0.1–1.0)
Monocytes Relative: 9.7 % (ref 3.0–12.0)
NEUTROS ABS: 5.3 10*3/uL (ref 1.4–7.7)
NEUTROS PCT: 64.4 % (ref 43.0–77.0)
PLATELETS: 228 10*3/uL (ref 150.0–400.0)
RBC: 4.09 Mil/uL (ref 3.87–5.11)
RDW: 13.7 % (ref 11.5–15.5)
WBC: 8.2 10*3/uL (ref 4.0–10.5)

## 2017-08-30 LAB — COMPREHENSIVE METABOLIC PANEL
ALT: 13 U/L (ref 0–35)
AST: 19 U/L (ref 0–37)
Albumin: 3.9 g/dL (ref 3.5–5.2)
Alkaline Phosphatase: 50 U/L (ref 39–117)
BUN: 10 mg/dL (ref 6–23)
CALCIUM: 9.1 mg/dL (ref 8.4–10.5)
CHLORIDE: 105 meq/L (ref 96–112)
CO2: 32 meq/L (ref 19–32)
CREATININE: 0.87 mg/dL (ref 0.40–1.20)
GFR: 67.67 mL/min (ref >60.00)
GLUCOSE: 94 mg/dL (ref 70–99)
Potassium: 3.8 mEq/L (ref 3.5–5.1)
Sodium: 142 mEq/L (ref 135–145)
Total Bilirubin: 1 mg/dL (ref 0.2–1.2)
Total Protein: 6.6 g/dL (ref 6.0–8.3)

## 2017-08-30 LAB — TSH: TSH: 1.01 u[IU]/mL (ref 0.35–4.50)

## 2017-08-30 LAB — LIPID PANEL
Cholesterol: 169 mg/dL (ref 0–200)
HDL: 48 mg/dL (ref >39.00)
LDL Cholesterol: 107 mg/dL — ABNORMAL HIGH (ref 0–99)
NonHDL: 120.54
TRIGLYCERIDES: 68 mg/dL (ref 0.0–149.0)
Total CHOL/HDL Ratio: 4
VLDL: 13.6 mg/dL (ref 0.0–40.0)

## 2017-08-30 LAB — HEMOGLOBIN A1C: Hgb A1c MFr Bld: 5.7 % (ref 4.6–6.5)

## 2017-08-30 LAB — VITAMIN D 25 HYDROXY (VIT D DEFICIENCY, FRACTURES): VITD: 23.37 ng/mL — AB (ref 30.00–100.00)

## 2017-08-30 MED ORDER — CEPHALEXIN 500 MG PO CAPS: 500.0000 mg | ORAL_CAPSULE | Freq: Four times a day (QID) | ORAL | 0 refills | Status: DC

## 2017-08-30 MED ORDER — ZOSTER VAC RECOMB ADJUVANTED 50 MCG/0.5ML IM SUSR: 0.5000 mL | Freq: Once | INTRAMUSCULAR | 1 refills | Status: AC

## 2017-08-30 NOTE — Progress Notes (Signed)
Patient ID: Margaret Holmes, female  DOB: April 13, 1941, 76 y.o.   MRN: [id number] Patient Care Team    Relationship Specialty Notifications Start End  [name], DO PCP - General Family Medicine  04/09/16   Richmond Campbell, MD Consulting Physician Gastroenterology  05/23/16   Salvadore Dom, MD Consulting Physician Obstetrics and Gynecology  05/23/16   Leroy Sea, MD Referring Physician Urology  08/27/16     Subjective:  Margaret Holmes is a 76 y.o.  Female  present for CPE. All past medical history, surgical history, allergies, family history, immunizations and social history was obtained from the patient today and entered into the electronic medical record.   Hematuria: Patient reports urinary frequency and seeing a little bit of blood in her urine yesterday. Over the weekend she reports she was in a good deal of pain on the right-hand side of her flank. She denies any fever, chills, nausea or vomit. She is tolerating food and drink. She is wondering if she passed a kidney stone. She recently had a right ureter stent placement.  Health maintenance: Updated 08/30/2017 Colonoscopy: completed 05/2015, by [facility], resutls hemorrhoid. follow up 5 years. Mammogram: completed:10/19/2016, birads 1.  Cervical cancer screening: last pap: 2017, she is being followed by GYN.  Immunizations: tdap 08/2016, Influenza 08/08/2017 (encouraged yearly), PNA series 07/28/2015/08/27/2016 completed Infectious disease screening: completed DEXA: last completed 2015, result normal, follow needed GYN Assistive device: None Oxygen use None Patient has a Dental home. Hospitalizations/ED visits: None Depression screen [facility] 2/9 08/30/2017 08/27/2016 02/07/2016  Decreased Interest 0 0 0  Down, Depressed, Hopeless 0 0 0  PHQ - 2 Score 0 0 0     Immunization History  Administered Date(s) Administered  . Influenza, High Dose Seasonal PF 07/24/2016, 08/08/2017  . Influenza,inj,Quad PF,6+ Mos 08/01/2015   . Pneumococcal Conjugate-13 07/28/2015  . Pneumococcal Polysaccharide-23 08/27/2016  . Td 05/07/2016     Past Medical History:  Diagnosis Date  . Adnexal cyst 07/2013   Left-3cm-noted on CT abd/pelv--f/u pelvic u/s did not show anything (L ovary not visualized, likely secondary to bowel gas)-repeat pelvic u/s after 07/2014.  Marland Kitchen Anemia   . Asthma   . DDD (degenerative disc disease), cervical   . H/O angular cheilitis    Zinc normal, vit B12 low normal (2012)  . H/O vitamin D deficiency 2011   Came back to normal range with replacement therapydd  . History of adenomatous polyp of colon 2003, 2006, 2013;2016   Repeat 2013 showed tubular adenoma x 3, with no high grade dysplasia.  2016 no polyps-recall 5 yrs  . History of hiatal hernia   . Hyperlipidemia    per pt report 05/09/12; also on labs 06/2015.  Intolerant of pravachol 02/2016  . Hypothyroidism   . Kidney infection   . Lower leg pain    Bilateral, crampy--ABIs/dopplers NORMAL 04/2013.  . 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 PONV (postoperative nausea and vomiting)    "jerks" afterward  . Shortness of breath dyspnea    Allergies  Allergen Reactions  . Codeine Other (See Comments)    Not specified in old records.  Carlton Adam [Propoxyphene N-Acetaminophen] Other (See Comments)    Not specified in old records  . Hydrocodone Other (See Comments)    Not specified in old records  . Valium [Diazepam]     Psychiatric reaction   Past Surgical History:  Procedure Laterality Date  .  BAND HEMORRHOIDECTOMY    . CHOLECYSTECTOMY  1980s  . COLONOSCOPY W/ BIOPSIES AND POLYPECTOMY  06/02/12;05/30/15   No polyps 2016-recall 5 yrs.  +internal hem.  Normal ileoscopy.  Marland Kitchen DILATATION & CURETTAGE/HYSTEROSCOPY WITH MYOSURE N/A 05/28/2016   Procedure: DILATATION & CURETTAGE/HYSTEROSCOPY WITH MYOSURE with ULTRASOUND guidance;  Surgeon: Salvadore Dom, MD;  Location: [facility];  Service: Gynecology;   Laterality: N/A;  Please have ultrasound in room. Follow Silva's first case.  Marland Kitchen GANGLION CYST EXCISION  2003   Right wrist, with excision of some triquetrum spurs  . stent placed right kidney    . THYROIDECTOMY, PARTIAL  1972  . TONSILLECTOMY AND ADENOIDECTOMY  age 68  . TUBAL LIGATION    . WRIST SURGERY  2006   For left thumb carpo-metacarpal arthritis   Family History  Problem Relation Age of Onset  . Cancer Mother        ovarian and throat cancer  . Diabetes Mother   . Alcohol abuse Father        alcoholism.  Died age 38  . Cancer Maternal Grandmother        breast cancer   Social History   Social History  . Marital status: Married    Spouse name: N/A  . Number of children: N/A  . Years of education: N/A   Occupational History  . Not on file.   Social History Main Topics  . Smoking status: Never Smoker  . Smokeless tobacco: Never Used  . Alcohol use No  . Drug use: No  . Sexual activity: No   Other Topics Concern  . Not on file   Social History Narrative   Married, 3 children, 6 grandchildren, 1 GGc.   Orig from this region.  Drives a school bus (X 31 yrs).   No T/A/Ds.   Caffeine: one cup coffee a day, 3 glasses of sweet tea per day.   No formal exercise.  She is active (NOT sedentary).               Allergies as of 08/30/2017      Reactions   Codeine Other (See Comments)   Not specified in old records.   Darvocet [propoxyphene N-acetaminophen] Other (See Comments)   Not specified in old records   Hydrocodone Other (See Comments)   Not specified in old records   Valium [diazepam]    Psychiatric reaction      Medication List       Accurate as of 08/30/17  8:41 AM. Always use your most recent med list.          acetaminophen 325 MG tablet Commonly known as:  TYLENOL Take 650 mg by mouth every 6 (six) hours as needed.   fluticasone 50 MCG/ACT nasal spray Commonly known as:  FLONASE Place 1 spray into both nostrils daily.   levothyroxine 75  MCG tablet Commonly known as:  SYNTHROID, LEVOTHROID TAKE 1 TABLET DAILY   PROAIR HFA 108 (90 Base) MCG/ACT inhaler Generic drug:  albuterol Inhale 1-2 puffs into the lungs every 6 (six) hours as needed. Reported on 05/15/2016   triamcinolone 0.025 % cream Commonly known as:  KENALOG APPLY 1 APPLICATION OF CREAM TOPICALLY TWICE DAILY        No results found for this or any previous visit (from the past 2160 hour(s)).  No results found.   ROS: 14 pt review of systems performed and negative (unless mentioned in an HPI)  Objective: BP (!) 92/57 (BP Location: Right  Arm, Patient Position: Sitting, Cuff Size: Normal)   Pulse 67   Temp 98 F (36.7 C)   Resp 20   Ht '5\' 2"'  (1.575 m)   Wt 113 lb 8 oz (51.5 kg)   SpO2 96%   BMI 20.76 kg/m   Gen: Afebrile. No acute distress. Nontoxic in appearance, thin, Caucasian female. HENT: AT. [place]. Bilateral TM visualized and normal in appearance. MMM. Bilateral nares without erythema or drainage. Throat without erythema or exudates. No cough, no hoarseness. Eyes:Pupils Equal Round Reactive to light, Extraocular movements intact,  Conjunctiva without redness, discharge or icterus. Neck/lymp/endocrine: Supple, no lymphadenopathy, no thyromegaly CV: RRR no murmur, no edema, +2/4 P posterior tibialis pulses Chest: CTAB, no wheeze or crackles Abd: Soft. NTND. BS present. No Masses palpated.  MSK: Mild right CVA tenderness. Skin: No rashes, purpura or petechiae. Warm well perfused intact. Neuro:  Normal gait. PERLA. EOMi. Alert. Orientedx3 Cranial nerves II through XII intact. Muscle strength 5/5 bilateral upper and lower extremity.  Psych: Normal affect, dress and demeanor. Normal speech. Normal thought content and judgment.    Assessment/plan: ELLOUISE MCWHIRTER is a 76 y.o. female present for CPE. Encounter for preventive health examination Patient was encouraged to exercise greater than 150 minutes a week. Patient was encouraged to choose a diet  filled with fresh fruits and vegetables, and lean meats. AVS provided to patient today for education/recommendation on gender specific health and safety maintenance. - Up-to-date with immunizations. Shingrix script is provided to patient today. - Colonoscopy up-to-date - Mammogram due in December, she will make the appointment. hyperlipidemia - Counseled on dietary changes and exercise. Controlled well with dietary changes. - CBC w/Diff - Comp Met (CMET) - HgB A1c - Lipid panel Hypothyroidism, unspecified type - Normal thyroid panel 08/27/2016. Will await results and then provide refills of medication. - Continue Synthroid 75 g daily - HgB A1c - TSH  Vitamin D deficiency - Taking multivitamin only - Vitamin D (25 hydroxy)  Hematuria, unspecified type - Concern for infection with positive nitrates, moderate leukocytes, large blood. She has mild right CVA tenderness, however this is chronic for her since stent placement. Possibly could've passed a kidney stone. Will treat with Keflex prophylactically and sent for urine culture. - POCT Urinalysis Dipstick (Automated) - Urine Culture  CPE 1 year  Electronically signed by: Howard Pouch, [facility] [place]

## 2017-08-30 NOTE — Patient Instructions (Signed)

## 2017-09-02 ENCOUNTER — Telehealth: Payer: Self-pay | Admitting: Family Medicine

## 2017-09-02 LAB — URINE CULTURE
MICRO NUMBER:: 81235093
SPECIMEN QUALITY: ADEQUATE

## 2017-09-02 MED ORDER — LEVOTHYROXINE SODIUM 75 MCG PO TABS: 75.0000 ug | ORAL_TABLET | Freq: Every day | ORAL | 3 refills | Status: DC

## 2017-09-02 NOTE — Telephone Encounter (Signed)
Please call pt: - her labs look good with the exception of her vit d is mildly low. Please encourage her to increase her consumption of foods rich in vit d. She has to be cautious with added supplements secondary to kidney stones.  - her thyroid is functioning normal. Refills called into mail in pharm.  - her urine studies were positive for E.Coli and the medication she was started on will treat her infection, take until completed. Follow up only needed if not resolved after abx.

## 2017-09-02 NOTE — Telephone Encounter (Signed)
Left detailed message with results and instructions on patient voice mail per DPR 

## 2017-11-04 ENCOUNTER — Telehealth: Payer: Self-pay | Admitting: *Deleted

## 2017-11-04 NOTE — Telephone Encounter (Signed)
-----   Message from Salvadore Dom, MD sent at 11/01/2017  1:00 PM EST ----- Please call this patient, she is overdue for a f/u ultrasound of an ovarian cyst. If she has had is done else where then document it in the chart and cancel the order, otherwise she really needs to have it done. Thanks, Sharee Pimple ----- Message ----- From: SYSTEM Sent: 09/28/2017  12:07 AM To: Salvadore Dom, MD

## 2017-11-04 NOTE — Telephone Encounter (Signed)
Left message regarding F/U U/S scheduling.

## 2017-11-08 ENCOUNTER — Telehealth: Payer: Self-pay | Admitting: Family Medicine

## 2017-11-08 NOTE — Telephone Encounter (Signed)
Called patient home phone number no answer. Patient was given an Rx for Shingrix at her physical appt in November.

## 2017-11-08 NOTE — Telephone Encounter (Signed)
Patient's daughter is asking if patient can have the new shringrix vaccine. Patient has received shingles vaccine in the past but she thinks it was the older version. Please contact patient to let her know if she can get shringrix.

## 2017-11-12 NOTE — Telephone Encounter (Signed)
Spoke with patient she states she is not sure if she wants to get this vaccine she will think about it and check with her Insurance company for cost to her. She does have the Rx we gave her if she decides to get vaccine.

## 2017-11-13 NOTE — Telephone Encounter (Signed)
Tried to contact patient again regarding scheduling U/S however there was no answer or VM. Sending back to Dr. Talbert Nan for review Thanks

## 2017-11-13 NOTE — Telephone Encounter (Signed)
Please send her a letter and then close the encounter.

## 2017-11-18 NOTE — Telephone Encounter (Signed)
Sending to Gay Filler, [place] supervisor for letter.

## 2018-02-03 ENCOUNTER — Other Ambulatory Visit: Payer: Self-pay | Admitting: *Deleted

## 2018-02-03 ENCOUNTER — Encounter: Payer: Self-pay | Admitting: *Deleted

## 2018-02-03 DIAGNOSIS — N83202 Unspecified ovarian cyst, left side: Secondary | ICD-10-CM

## 2018-02-03 NOTE — Telephone Encounter (Signed)
Call from patient. States she has been active in her yard and now has left sided back and abdominal pain. Agreeable to schedule appointment for pelvic ultrasound.  Scheduled for 02-13-18 at 1100. Declined earlier appointment due to scheduling restrictions and caring for grandchildren.  Encouraged to call back for earlier appointment if pain increases. Patient states she feels this is related to yard work but if worsens will go to ED.      Routing to provider for final review. Patient agreeable to disposition. Will close encounter.

## 2018-02-03 NOTE — Telephone Encounter (Signed)
Follow-up call to patient. Left message to call back to nursing supervisor, Gay Filler with update. Advised calling regarding recommended appointment and have been unable to reach her.

## 2018-02-04 ENCOUNTER — Telehealth: Payer: Self-pay | Admitting: Obstetrics and Gynecology

## 2018-02-04 NOTE — Telephone Encounter (Signed)
Call placed to patient to address benefit questions for a scheduled ultrasound appointment with an annual exam. Patient understood and agreeable. Patient scheduled 02/13/18 with Dr Talbert Nan. Patient aware of appointment date, arrival time and  cancellation policy. No further questions. Ok to close    cc: Lamont Snowball, RN

## 2018-02-13 ENCOUNTER — Other Ambulatory Visit: Payer: Self-pay

## 2018-02-13 ENCOUNTER — Ambulatory Visit (INDEPENDENT_AMBULATORY_CARE_PROVIDER_SITE_OTHER): Payer: Medicare Other

## 2018-02-13 ENCOUNTER — Ambulatory Visit (INDEPENDENT_AMBULATORY_CARE_PROVIDER_SITE_OTHER): Payer: Medicare Other | Admitting: Obstetrics and Gynecology

## 2018-02-13 ENCOUNTER — Encounter: Payer: Self-pay | Admitting: Obstetrics and Gynecology

## 2018-02-13 VITALS — BP 138/70 | HR 56 | Resp 14 | Ht 62.25 in | Wt 116.0 lb

## 2018-02-13 DIAGNOSIS — Z01419 Encounter for gynecological examination (general) (routine) without abnormal findings: Secondary | ICD-10-CM | POA: Diagnosis not present

## 2018-02-13 DIAGNOSIS — N83202 Unspecified ovarian cyst, left side: Secondary | ICD-10-CM

## 2018-02-13 DIAGNOSIS — N949 Unspecified condition associated with female genital organs and menstrual cycle: Secondary | ICD-10-CM | POA: Diagnosis not present

## 2018-02-13 NOTE — Patient Instructions (Signed)
EXERCISE AND DIET:  We recommended that you start or continue a regular exercise program for good health. Regular exercise means any activity that makes your heart beat faster and makes you sweat.  We recommend exercising at least 30 minutes per day at least 3 days a week, preferably 4 or 5.  We also recommend a diet low in fat and sugar.  Inactivity, poor dietary choices and obesity can cause diabetes, heart attack, stroke, and kidney damage, among others.    ALCOHOL AND SMOKING:  Women should limit their alcohol intake to no more than 7 drinks/beers/glasses of wine (combined, not each!) per week. Moderation of alcohol intake to this level decreases your risk of breast cancer and liver damage. And of course, no recreational drugs are part of a healthy lifestyle.  And absolutely no smoking or even second hand smoke. Most people know smoking can cause heart and lung diseases, but did you know it also contributes to weakening of your bones? Aging of your skin?  Yellowing of your teeth and nails?  CALCIUM AND VITAMIN D:  Adequate intake of calcium and Vitamin D are recommended.  The recommendations for exact amounts of these supplements seem to change often, but generally speaking 600 mg of calcium (either carbonate or citrate) and 800 units of Vitamin D per day seems prudent. Certain women may benefit from higher intake of Vitamin D.  If you are among these women, your doctor will have told you during your visit.    PAP SMEARS:  Pap smears, to check for cervical cancer or precancers,  have traditionally been done yearly, although recent scientific advances have shown that most women can have pap smears less often.  However, every woman still should have a physical exam from her gynecologist every year. It will include a breast check, inspection of the vulva and vagina to check for abnormal growths or skin changes, a visual exam of the cervix, and then an exam to evaluate the size and shape of the uterus and  ovaries.  And after 75 years of age, a rectal exam is indicated to check for rectal cancers. We will also provide age appropriate advice regarding health maintenance, like when you should have certain vaccines, screening for sexually transmitted diseases, bone density testing, colonoscopy, mammograms, etc.   MAMMOGRAMS:  All women over 40 years old should have a yearly mammogram. Many facilities now offer a "3D" mammogram, which may cost around $50 extra out of pocket. If possible,  we recommend you accept the option to have the 3D mammogram performed.  It both reduces the number of women who will be called back for extra views which then turn out to be normal, and it is better than the routine mammogram at detecting truly abnormal areas.    COLONOSCOPY:  Colonoscopy to screen for colon cancer is recommended for all women at age 50.  We know, you hate the idea of the prep.  We agree, BUT, having colon cancer and not knowing it is worse!!  Colon cancer so often starts as a polyp that can be seen and removed at colonscopy, which can quite literally save your life!  And if your first colonoscopy is normal and you have no family history of colon cancer, most women don't have to have it again for 10 years.  Once every ten years, you can do something that may end up saving your life, right?  We will be happy to help you get it scheduled when you are ready.    Be sure to check your insurance coverage so you understand how much it will cost.  It may be covered as a preventative service at no cost, but you should check your particular policy.      Breast Self-Awareness Breast self-awareness means being familiar with how your breasts look and feel. It involves checking your breasts regularly and reporting any changes to your health care provider. Practicing breast self-awareness is important. A change in your breasts can be a sign of a serious medical problem. Being familiar with how your breasts look and feel allows  you to find any problems early, when treatment is more likely to be successful. All women should practice breast self-awareness, including women who have had breast implants. How to do a breast self-exam One way to learn what is normal for your breasts and whether your breasts are changing is to do a breast self-exam. To do a breast self-exam: Look for Changes  1. Remove all the clothing above your waist. 2. Stand in front of a mirror in a room with good lighting. 3. Put your hands on your hips. 4. Push your hands firmly downward. 5. Compare your breasts in the mirror. Look for differences between them (asymmetry), such as: ? Differences in shape. ? Differences in size. ? Puckers, dips, and bumps in one breast and not the other. 6. Look at each breast for changes in your skin, such as: ? Redness. ? Scaly areas. 7. Look for changes in your nipples, such as: ? Discharge. ? Bleeding. ? Dimpling. ? Redness. ? A change in position. Feel for Changes  Carefully feel your breasts for lumps and changes. It is best to do this while lying on your back on the floor and again while sitting or standing in the shower or tub with soapy water on your skin. Feel each breast in the following way:  Place the arm on the side of the breast you are examining above your head.  Feel your breast with the other hand.  Start in the nipple area and make  inch (2 cm) overlapping circles to feel your breast. Use the pads of your three middle fingers to do this. Apply light pressure, then medium pressure, then firm pressure. The light pressure will allow you to feel the tissue closest to the skin. The medium pressure will allow you to feel the tissue that is a little deeper. The firm pressure will allow you to feel the tissue close to the ribs.  Continue the overlapping circles, moving downward over the breast until you feel your ribs below your breast.  Move one finger-width toward the center of the body.  Continue to use the  inch (2 cm) overlapping circles to feel your breast as you move slowly up toward your collarbone.  Continue the up and down exam using all three pressures until you reach your armpit.  Write Down What You Find  Write down what is normal for each breast and any changes that you find. Keep a written record with breast changes or normal findings for each breast. By writing this information down, you do not need to depend only on memory for size, tenderness, or location. Write down where you are in your menstrual cycle, if you are still menstruating. If you are having trouble noticing differences in your breasts, do not get discouraged. With time you will become more familiar with the variations in your breasts and more comfortable with the exam. How often should I examine my breasts? Examine   your breasts every month. If you are breastfeeding, the best time to examine your breasts is after a feeding or after using a breast pump. If you menstruate, the best time to examine your breasts is 5-7 days after your period is over. During your period, your breasts are lumpier, and it may be more difficult to notice changes. When should I see my health care provider? See your health care provider if you notice:  A change in shape or size of your breasts or nipples.  A change in the skin of your breast or nipples, such as a reddened or scaly area.  Unusual discharge from your nipples.  A lump or thick area that was not there before.  Pain in your breasts.  Anything that concerns you.  This information is not intended to replace advice given to you by your health care provider. Make sure you discuss any questions you have with your health care provider. Document Released: 10/15/2005 Document Revised: 03/22/2016 Document Reviewed: 09/04/2015 Elsevier Interactive Patient Education  2018 [employer]  

## 2018-02-13 NOTE — Progress Notes (Signed)
77 y.o. [id number] MarriedCaucasianF here for annual exam. The patient is being followed for a simple left adnexal cyst first noted in 10/14 on CT 3 cm.   She had a f/u ultrasound that was negative for an ovarian cyst. Her left ovary was not seen. She then had a CT scan in 5/17 that showed a 3.6 x 3.7 left adnexal cyst. F/U ultrasound in 6/17 showed a 3.8 x 3,7 x 3.1 cm left simple ovarian cyst.  U/S in 10/17: 3.7 x 3.3 x 3.4 cm left ovarian cyst, simple.  CA 125 was normal at 29 in 6/17.   No vaginal bleeding.  No sexually active.     No LMP recorded. Patient is postmenopausal.          Sexually active: No.  The current method of family planning is post menopausal status.    Exercising: Yes.    yard work/ very active  Smoker:  no  Health Maintenance: Pap: 10-18-16 WNL  History of abnormal Pap:  no MMG:  12-22-17WNL  Colonoscopy:  05-30-15 WNL repeat in 5 yrs BMD:   10-19-16 WNL  TDaP:  05-07-16 Gardasil: N/A   reports that she has never smoked. She has never used smokeless tobacco. She reports that she does not drink alcohol or use drugs. 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 She watching her great grand kids.   Past Medical History:  Diagnosis Date  . Adnexal cyst 07/2013   Left-3cm-noted on CT abd/pelv--f/u pelvic u/s did not show anything (L ovary not visualized, likely secondary to bowel gas)-repeat pelvic u/s after 07/2014.  Marland Kitchen Anemia   . Asthma   . DDD (degenerative disc disease), cervical   . H/O angular cheilitis    Zinc normal, vit B12 low normal (2012)  . H/O vitamin D deficiency 2011   Came back to normal range with replacement therapydd  . History of adenomatous polyp of colon 2003, 2006, 2013;2016   Repeat 2013 showed tubular adenoma x 3, with no high grade dysplasia.  2016 no polyps-recall 5 yrs  . History of hiatal hernia   . Hyperlipidemia    per pt report 05/09/12; also on labs 06/2015.  Intolerant of pravachol 02/2016  . Hypothyroidism   . Kidney infection    . Lower leg pain    Bilateral, crampy--ABIs/dopplers NORMAL 04/2013.  . 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 PONV (postoperative nausea and vomiting)    "jerks" afterward  . Shortness of breath dyspnea     Past Surgical History:  Procedure Laterality Date  . BAND HEMORRHOIDECTOMY    . CHOLECYSTECTOMY  1980s  . COLONOSCOPY W/ BIOPSIES AND POLYPECTOMY  06/02/12;05/30/15   No polyps 2016-recall 5 yrs.  +internal hem.  Normal ileoscopy.  Marland Kitchen DILATATION & CURETTAGE/HYSTEROSCOPY WITH MYOSURE N/A 05/28/2016   Procedure: DILATATION & CURETTAGE/HYSTEROSCOPY WITH MYOSURE with ULTRASOUND guidance;  Surgeon: Salvadore Dom, MD;  Location: [facility];  Service: Gynecology;  Laterality: N/A;  Please have ultrasound in room. Follow Silva's first case.  Marland Kitchen GANGLION CYST EXCISION  2003   Right wrist, with excision of some triquetrum spurs  . stent placed right kidney    . THYROIDECTOMY, PARTIAL  1972  . TONSILLECTOMY AND ADENOIDECTOMY  age 69  . TUBAL LIGATION    . WRIST SURGERY  2006   For left thumb carpo-metacarpal arthritis    Current Outpatient Medications  Medication Sig Dispense Refill  . acetaminophen (TYLENOL) 325 MG tablet  Take 650 mg by mouth every 6 (six) hours as needed.    Marland Kitchen levothyroxine (SYNTHROID, LEVOTHROID) 75 MCG tablet Take 1 tablet (75 mcg total) daily by mouth. 90 tablet 3  . PROAIR HFA 108 (90 Base) MCG/ACT inhaler Inhale 1-2 puffs into the lungs every 6 (six) hours as needed. Reported on 05/15/2016     No current facility-administered medications for this visit.     Family History  Problem Relation Age of Onset  . Cancer Mother        ovarian and throat cancer  . Diabetes Mother   . Alcohol abuse Father        alcoholism.  Died age 51  . Cancer Maternal Grandmother        breast cancer    Review of Systems  Constitutional: Negative.   HENT: Positive for sinus pressure.   Eyes: Negative.   Respiratory: Negative.    Cardiovascular: Negative.   Gastrointestinal: Positive for diarrhea and nausea.  Endocrine: Negative.   Genitourinary: Negative.   Musculoskeletal: Positive for myalgias.  Skin: Negative.   Allergic/Immunologic: Negative.   Neurological: Positive for headaches.  Psychiatric/Behavioral: Negative.     Exam:   BP 138/70 (BP Location: Right Arm, Patient Position: Sitting, Cuff Size: Normal)   Pulse (!) 56   Resp 14   Ht 5' 2.25" (1.581 m)   Wt 116 lb (52.6 kg)   BMI 21.05 kg/m   Weight change: @WEIGHTCHANGE @ Height:   Height: 5' 2.25" (158.1 cm)  Ht Readings from Last 3 Encounters:  02/13/18 5' 2.25" (1.581 m)  08/30/17 5\' 2"  (1.575 m)  04/19/17 5\' 2"  (1.575 m)    General appearance: alert, cooperative and appears stated age Head: Normocephalic, without obvious abnormality, atraumatic Neck: no adenopathy, supple, symmetrical, trachea midline and thyroid normal to inspection and palpation Lungs: clear to auscultation bilaterally Cardiovascular: regular rate and rhythm Breasts: normal appearance, no masses or tenderness Abdomen: soft, non-tender; non distended,  no masses,  no organomegaly Extremities: extremities normal, atraumatic, no cyanosis or edema Skin: Skin color, texture, turgor normal. No rashes or lesions Lymph nodes: Cervical, supraclavicular, and axillary nodes normal. No abnormal inguinal nodes palpated Neurologic: Grossly normal   Pelvic: External genitalia:  no lesions              Urethra:  normal appearing urethra with no masses, tenderness or lesions              Bartholins and Skenes: normal                 Vagina: atrophic appearing vagina with normal color and discharge, no lesions              Cervix: unable to clearly see her cervix, the vagina is atrophic and scarred in front of the cervix.                Bimanual Exam:  Uterus:  normal size, contour, position, consistency, mobility, non-tender              Adnexa: no mass, fullness, tenderness                Rectovaginal: Confirms               Anus:  normal sphincter tone, no lesions  Chaperone was present for exam.  Reviewed ultrasound images with the patient: simple left adnexal cyst is 3.1 x 2.9 x 2.8 cm. Down from 3.7 x 3.3 x 3.4 cm in 10/17  A:  Well Woman with normal exam  PMP female with simple left adnexal cyst, normal CA 125. The cyst is slightly smaller today    P:   F/U ultrasound in one year  No pap needed  Discussed breast self exam  Discussed calcium and vit D intake  Mammogram is due  Colonoscopy and DEXA are UTD  Labs with primary MD

## 2018-06-19 ENCOUNTER — Telehealth: Payer: Self-pay

## 2018-06-19 NOTE — Telephone Encounter (Signed)
Shingles shot is recommend for all adults over 50 to help prevent shingles. It is a series of 2 injections, repeat dose to occur 2-6 months after 1st

## 2018-06-19 NOTE — Telephone Encounter (Signed)
Copied from [place] [id number]. Topic: General - Other >> Jun 19, 2018 11:03 AM Marin Olp L wrote: Reason for CRM: Patient wants to know if Dr. Raoul Pitch think she still needs the shingles vaccine? Needs to know today.

## 2018-06-19 NOTE — Telephone Encounter (Signed)
Patient notified and verbalized understanding. She stated that she would think about it and let us know.

## 2018-07-16 ENCOUNTER — Other Ambulatory Visit: Payer: Self-pay | Admitting: Obstetrics and Gynecology

## 2018-07-16 DIAGNOSIS — Z1231 Encounter for screening mammogram for malignant neoplasm of breast: Secondary | ICD-10-CM

## 2018-08-12 ENCOUNTER — Other Ambulatory Visit: Payer: Self-pay | Admitting: Family Medicine

## 2018-08-18 ENCOUNTER — Ambulatory Visit
Admission: RE | Admit: 2018-08-18 | Discharge: 2018-08-18 | Disposition: A | Discharge status: Home / Self Care | Payer: BC Managed Care – PPO | Source: Ambulatory Visit | Attending: Obstetrics and Gynecology | Admitting: Obstetrics and Gynecology

## 2018-08-18 DIAGNOSIS — Z1231 Encounter for screening mammogram for malignant neoplasm of breast: Secondary | ICD-10-CM | POA: Diagnosis not present

## 2018-09-01 ENCOUNTER — Encounter: Payer: Self-pay | Admitting: Family Medicine

## 2018-09-01 ENCOUNTER — Ambulatory Visit (INDEPENDENT_AMBULATORY_CARE_PROVIDER_SITE_OTHER): Payer: Medicare Other | Admitting: Family Medicine

## 2018-09-01 VITALS — BP 122/75 | HR 60 | Temp 97.8°F | Resp 16 | Ht 62.0 in | Wt 114.0 lb

## 2018-09-01 DIAGNOSIS — Z0000 Encounter for general adult medical examination without abnormal findings: Secondary | ICD-10-CM | POA: Diagnosis not present

## 2018-09-01 DIAGNOSIS — E039 Hypothyroidism, unspecified: Secondary | ICD-10-CM | POA: Diagnosis not present

## 2018-09-01 DIAGNOSIS — D509 Iron deficiency anemia, unspecified: Secondary | ICD-10-CM | POA: Diagnosis not present

## 2018-09-01 DIAGNOSIS — Z79899 Other long term (current) drug therapy: Secondary | ICD-10-CM | POA: Diagnosis not present

## 2018-09-01 DIAGNOSIS — E559 Vitamin D deficiency, unspecified: Secondary | ICD-10-CM | POA: Diagnosis not present

## 2018-09-01 LAB — COMPREHENSIVE METABOLIC PANEL
ALBUMIN: 4.3 g/dL (ref 3.5–5.2)
ALT: 18 U/L (ref 0–35)
AST: 24 U/L (ref 0–37)
Alkaline Phosphatase: 50 U/L (ref 39–117)
BUN: 12 mg/dL (ref 6–23)
CHLORIDE: 103 meq/L (ref 96–112)
CO2: 29 mEq/L (ref 19–32)
Calcium: 9.3 mg/dL (ref 8.4–10.5)
Creatinine, Ser: 0.77 mg/dL (ref 0.40–1.20)
GFR: 77.7 mL/min (ref >60.00)
GLUCOSE: 93 mg/dL (ref 70–99)
POTASSIUM: 4.5 meq/L (ref 3.5–5.1)
SODIUM: 140 meq/L (ref 135–145)
Total Bilirubin: 0.7 mg/dL (ref 0.2–1.2)
Total Protein: 6.8 g/dL (ref 6.0–8.3)

## 2018-09-01 LAB — VITAMIN D 25 HYDROXY (VIT D DEFICIENCY, FRACTURES): VITD: 23.63 ng/mL — AB (ref 30.00–100.00)

## 2018-09-01 LAB — LIPID PANEL
CHOL/HDL RATIO: 4
Cholesterol: 195 mg/dL (ref 0–200)
HDL: 52.8 mg/dL (ref >39.00)
LDL CALC: 125 mg/dL — AB (ref 0–99)
NonHDL: 141.71
TRIGLYCERIDES: 86 mg/dL (ref 0.0–149.0)
VLDL: 17.2 mg/dL (ref 0.0–40.0)

## 2018-09-01 LAB — CBC
HCT: 38.5 % (ref 36.0–46.0)
HEMOGLOBIN: 13 g/dL (ref 12.0–15.0)
MCHC: 33.7 g/dL (ref 30.0–36.0)
MCV: 90.1 fl (ref 78.0–100.0)
PLATELETS: 262 10*3/uL (ref 150.0–400.0)
RBC: 4.28 Mil/uL (ref 3.87–5.11)
RDW: 14.1 % (ref 11.5–15.5)
WBC: 6.3 10*3/uL (ref 4.0–10.5)

## 2018-09-01 LAB — TSH: TSH: 0.55 u[IU]/mL (ref 0.35–4.50)

## 2018-09-01 NOTE — Patient Instructions (Addendum)
Follow up yearly with health coach/Nurse for medicare wellness and with then with your PCP for chronic conditions.   We will collect labs today and call you with results. I will refill you thyroid medication once I get it back.   Keep routine eye exams.  Make sure to use nasal saline daily to flush passages. Use daily antihistamine. If Symptoms worsen over next week or you get a fever, come back for evaluation. You do not appear infected today   Health Maintenance, Female Adopting a healthy lifestyle and getting preventive care can go a long way to promote health and wellness. Talk with your health care provider about what schedule of regular examinations is right for you. This is a good chance for you to check in with your provider about disease prevention and staying healthy. In between checkups, there are plenty of things you can do on your own. Experts have done a lot of research about which lifestyle changes and preventive measures are most likely to keep you healthy. Ask your health care provider for more information. Weight and diet Eat a healthy diet  Be sure to include plenty of vegetables, fruits, low-fat dairy products, and lean protein.  Do not eat a lot of foods high in solid fats, added sugars, or salt.  Get regular exercise. This is one of the most important things you can do for your health. ? Most adults should exercise for at least 150 minutes each week. The exercise should increase your heart rate and make you sweat (moderate-intensity exercise). ? Most adults should also do strengthening exercises at least twice a week. This is in addition to the moderate-intensity exercise.  Maintain a healthy weight  Body mass index (BMI) is a measurement that can be used to identify possible weight problems. It estimates body fat based on height and weight. Your health care provider can help determine your BMI and help you achieve or maintain a healthy weight.  For females 8 years  of age and older: ? A BMI below 18.5 is considered underweight. ? A BMI of 18.5 to 24.9 is normal. ? A BMI of 25 to 29.9 is considered overweight. ? A BMI of 30 and above is considered obese.  Watch levels of cholesterol and blood lipids  You should start having your blood tested for lipids and cholesterol at 75 years of age, then have this test every 5 years.  You may need to have your cholesterol levels checked more often if: ? Your lipid or cholesterol levels are high. ? You are older than 75 years of age. ? You are at high risk for heart disease.  Cancer screening Lung Cancer  Lung cancer screening is recommended for adults 11-40 years old who are at high risk for lung cancer because of a history of smoking.  A yearly low-dose CT scan of the lungs is recommended for people who: ? Currently smoke. ? Have quit within the past 15 years. ? Have at least a 30-pack-year history of smoking. A pack year is smoking an average of one pack of cigarettes a day for 1 year.  Yearly screening should continue until it has been 15 years since you quit.  Yearly screening should stop if you develop a health problem that would prevent you from having lung cancer treatment.  Breast Cancer  Practice breast self-awareness. This means understanding how your breasts normally appear and feel.  It also means doing regular breast self-exams. Let your health care provider know about any  changes, no matter how small.  If you are in your 20s or 30s, you should have a clinical breast exam (CBE) by a health care provider every 1-3 years as part of a regular health exam.  If you are 40 or older, have a CBE every year. Also consider having a breast X-ray (mammogram) every year.  If you have a family history of breast cancer, talk to your health care provider about genetic screening.  If you are at high risk for breast cancer, talk to your health care provider about having an MRI and a mammogram every  year.  Breast cancer gene (BRCA) assessment is recommended for women who have family members with BRCA-related cancers. BRCA-related cancers include: ? Breast. ? Ovarian. ? Tubal. ? Peritoneal cancers.  Results of the assessment will determine the need for genetic counseling and BRCA1 and BRCA2 testing.  Cervical Cancer Your health care provider may recommend that you be screened regularly for cancer of the pelvic organs (ovaries, uterus, and vagina). This screening involves a pelvic examination, including checking for microscopic changes to the surface of your cervix (Pap test). You may be encouraged to have this screening done every 3 years, beginning at age 21.  For women ages 30-65, health care providers may recommend pelvic exams and Pap testing every 3 years, or they may recommend the Pap and pelvic exam, combined with testing for human papilloma virus (HPV), every 5 years. Some types of HPV increase your risk of cervical cancer. Testing for HPV may also be done on women of any age with unclear Pap test results.  Other health care providers may not recommend any screening for nonpregnant women who are considered low risk for pelvic cancer and who do not have symptoms. Ask your health care provider if a screening pelvic exam is right for you.  If you have had past treatment for cervical cancer or a condition that could lead to cancer, you need Pap tests and screening for cancer for at least 20 years after your treatment. If Pap tests have been discontinued, your risk factors (such as having a new sexual partner) need to be reassessed to determine if screening should resume. Some women have medical problems that increase the chance of getting cervical cancer. In these cases, your health care provider may recommend more frequent screening and Pap tests.  Colorectal Cancer  This type of cancer can be detected and often prevented.  Routine colorectal cancer screening usually begins at 75  years of age and continues through 75 years of age.  Your health care provider may recommend screening at an earlier age if you have risk factors for colon cancer.  Your health care provider may also recommend using home test kits to check for hidden blood in the stool.  A small camera at the end of a tube can be used to examine your colon directly (sigmoidoscopy or colonoscopy). This is done to check for the earliest forms of colorectal cancer.  Routine screening usually begins at age 50.  Direct examination of the colon should be repeated every 5-10 years through 75 years of age. However, you may need to be screened more often if early forms of precancerous polyps or small growths are found.  Skin Cancer  Check your skin from head to toe regularly.  Tell your health care provider about any new moles or changes in moles, especially if there is a change in a mole's shape or color.  Also tell your health care provider if   you have a mole that is larger than the size of a pencil eraser.  Always use sunscreen. Apply sunscreen liberally and repeatedly throughout the day.  Protect yourself by wearing long sleeves, pants, a wide-brimmed hat, and sunglasses whenever you are outside.  Heart disease, diabetes, and high blood pressure  High blood pressure causes heart disease and increases the risk of stroke. High blood pressure is more likely to develop in: ? People who have blood pressure in the high end of the normal range (130-139/85-89 mm Hg). ? People who are overweight or obese. ? People who are African American.  If you are 77-88 years of age, have your blood pressure checked every 3-5 years. If you are 55 years of age or older, have your blood pressure checked every year. You should have your blood pressure measured twice-once when you are at a hospital or clinic, and once when you are not at a hospital or clinic. Record the average of the two measurements. To check your blood pressure  when you are not at a hospital or clinic, you can use: ? An automated blood pressure machine at a pharmacy. ? A home blood pressure monitor.  If you are between 58 years and 72 years old, ask your health care provider if you should take aspirin to prevent strokes.  Have regular diabetes screenings. This involves taking a blood sample to check your fasting blood sugar level. ? If you are at a normal weight and have a low risk for diabetes, have this test once every three years after 75 years of age. ? If you are overweight and have a high risk for diabetes, consider being tested at a younger age or more often. Preventing infection Hepatitis B  If you have a higher risk for hepatitis B, you should be screened for this virus. You are considered at high risk for hepatitis B if: ? You were born in a country where hepatitis B is common. Ask your health care provider which countries are considered high risk. ? Your parents were born in a high-risk country, and you have not been immunized against hepatitis B (hepatitis B vaccine). ? You have HIV or AIDS. ? You use needles to inject street drugs. ? You live with someone who has hepatitis B. ? You have had sex with someone who has hepatitis B. ? You get hemodialysis treatment. ? You take certain medicines for conditions, including cancer, organ transplantation, and autoimmune conditions.  Hepatitis C  Blood testing is recommended for: ? Everyone born from [birth year] through 1963. ? Anyone with known risk factors for hepatitis C.  Sexually transmitted infections (STIs)  You should be screened for sexually transmitted infections (STIs) including gonorrhea and chlamydia if: ? You are sexually active and are younger than 75 years of age. ? You are older than 75 years of age and your health care provider tells you that you are at risk for this type of infection. ? Your sexual activity has changed since you were last screened and you are at an increased  risk for chlamydia or gonorrhea. Ask your health care provider if you are at risk.  If you do not have HIV, but are at risk, it may be recommended that you take a prescription medicine daily to prevent HIV infection. This is called pre-exposure prophylaxis (PrEP). You are considered at risk if: ? You are sexually active and do not regularly use condoms or know the HIV status of your partner(s). ? You take drugs by  injection. ? You are sexually active with a partner who has HIV.  Talk with your health care provider about whether you are at high risk of being infected with HIV. If you choose to begin PrEP, you should first be tested for HIV. You should then be tested every 3 months for as long as you are taking PrEP. Pregnancy  If you are premenopausal and you may become pregnant, ask your health care provider about preconception counseling.  If you may become pregnant, take 400 to 800 micrograms (mcg) of folic acid every day.  If you want to prevent pregnancy, talk to your health care provider about birth control (contraception). Osteoporosis and menopause  Osteoporosis is a disease in which the bones lose minerals and strength with aging. This can result in serious bone fractures. Your risk for osteoporosis can be identified using a bone density scan.  If you are 67 years of age or older, or if you are at risk for osteoporosis and fractures, ask your health care provider if you should be screened.  Ask your health care provider whether you should take a calcium or vitamin D supplement to lower your risk for osteoporosis.  Menopause may have certain physical symptoms and risks.  Hormone replacement therapy may reduce some of these symptoms and risks. Talk to your health care provider about whether hormone replacement therapy is right for you. Follow these instructions at home:  Schedule regular health, dental, and eye exams.  Stay current with your immunizations.  Do not use any  tobacco products including cigarettes, chewing tobacco, or electronic cigarettes.  If you are pregnant, do not drink alcohol.  If you are breastfeeding, limit how much and how often you drink alcohol.  Limit alcohol intake to no more than 1 drink per day for nonpregnant women. One drink equals 12 ounces of beer, 5 ounces of wine, or 1 ounces of hard liquor.  Do not use street drugs.  Do not share needles.  Ask your health care provider for help if you need support or information about quitting drugs.  Tell your health care provider if you often feel depressed.  Tell your health care provider if you have ever been abused or do not feel safe at home. This information is not intended to replace advice given to you by your health care provider. Make sure you discuss any questions you have with your health care provider. Document Released: 04/30/2011 Document Revised: 03/22/2016 Document Reviewed: 07/19/2015 Elsevier Interactive Patient Education  [employer].

## 2018-09-01 NOTE — Progress Notes (Signed)
Initial/welcome to Medicare (IPPE) benefits started 10/2017. Chief Complaint  Patient presents with  . Medicare Wellness    Patient Care Team    Relationship Specialty Notifications Start End  [name], DO PCP - General Family Medicine  04/09/16   Richmond Campbell, MD Consulting Physician Gastroenterology  05/23/16   Salvadore Dom, MD Consulting Physician Obstetrics and Gynecology  05/23/16   Leroy Sea, MD Referring Physician Urology  08/27/16      History of Present Ilness: Margaret Holmes, 77 y.o. , female presents today for welcome to Medicare preventive  visit.  Pt has known vit d deficiency and takes a multivitamin daily. Last collection 1 year ago low.  Hypothyroid condition, reports compliance with synthroid 75 mcg a day.  She has an eye doctor with routine appt, last appt 1 month ago. Only needs readers, very mild cataracts.   Health  Maintenance: updated 09/01/18 Colonoscopy: completed 05/2015, by [facility]off, resutls hemorrhoid. follow up 5 years. Mammogram: completed:08/18/2018, birads 1.  Cervical cancer screening: last pap: 2017, she is being followed by GYN.  Immunizations: tdap 08/2016, Influenza 07/2018 (encouraged yearly), PNA series 07/28/2015/08/27/2016 completed. Shingrix completed 08/25/2018 Infectious disease screening: completed DEXA: last completed 2017, result normal, follow needed GYN  Past medical, surgical, family and social histories reviewed (including experiences with illnesses, hospital stays, operations, injuries, and treatments):  Past Medical History:  Diagnosis Date  . Adnexal cyst 07/2013   Left-3cm-noted on CT abd/pelv--f/u pelvic u/s did not show anything (L ovary not visualized, likely secondary to bowel gas)-repeat pelvic u/s after 07/2014.  Marland Kitchen Anemia   . Asthma   . DDD (degenerative disc disease), cervical   . H/O angular cheilitis    Zinc normal, vit B12 low normal (2012)  . H/O vitamin D deficiency 2011   Came back to  normal range with replacement therapydd  . History of adenomatous polyp of colon 2003, 2006, 2013;2016   Repeat 2013 showed tubular adenoma x 3, with no high grade dysplasia.  2016 no polyps-recall 5 yrs  . History of hiatal hernia   . Hyperlipidemia    per pt report 05/09/12; also on labs 06/2015.  Intolerant of pravachol 02/2016  . Hypothyroidism   . Kidney infection   . Lower leg pain    Bilateral, crampy--ABIs/dopplers NORMAL 04/2013.  . 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 PONV (postoperative nausea and vomiting)    "jerks" afterward  . Shortness of breath dyspnea    All allergies reviewed Allergies  Allergen Reactions  . Codeine Other (See Comments)    Not specified in old records.  Carlton Adam [Propoxyphene N-Acetaminophen] Other (See Comments)    Not specified in old records  . Hydrocodone Other (See Comments)    Not specified in old records  . Valium [Diazepam]     Psychiatric reaction   Past Surgical History:  Procedure Laterality Date  . BAND HEMORRHOIDECTOMY    . CHOLECYSTECTOMY  1980s  . COLONOSCOPY W/ BIOPSIES AND POLYPECTOMY  06/02/12;05/30/15   No polyps 2016-recall 5 yrs.  +internal hem.  Normal ileoscopy.  Marland Kitchen DILATATION & CURETTAGE/HYSTEROSCOPY WITH MYOSURE N/A 05/28/2016   Procedure: DILATATION & CURETTAGE/HYSTEROSCOPY WITH MYOSURE with ULTRASOUND guidance;  Surgeon: Salvadore Dom, MD;  Location: [facility];  Service: Gynecology;  Laterality: N/A;  Please have ultrasound in room. Follow Silva's first case.  Marland Kitchen GANGLION CYST EXCISION  2003   Right wrist, with excision of some  triquetrum spurs  . stent placed right kidney    . THYROIDECTOMY, PARTIAL  1972  . TONSILLECTOMY AND ADENOIDECTOMY  age 49  . TUBAL LIGATION    . WRIST SURGERY  2006   For left thumb carpo-metacarpal arthritis   Family History  Problem Relation Age of Onset  . Cancer Mother        ovarian and throat cancer  . Diabetes Mother   . Alcohol abuse  Father        alcoholism.  Died age 42  . Cancer Maternal Grandmother        breast cancer   Social History   Social History Narrative   Married, 3 children, 6 grandchildren, 1 GGc.   Orig from this region.  Drives a school bus (X 31 yrs).   No T/A/Ds.   Caffeine: one cup coffee a day, 3 glasses of sweet tea per day.   No formal exercise.  She is active (NOT sedentary).             All medications verified Allergies as of 09/01/2018      Reactions   Codeine Other (See Comments)   Not specified in old records.   Darvocet [propoxyphene N-acetaminophen] Other (See Comments)   Not specified in old records   Hydrocodone Other (See Comments)   Not specified in old records   Valium [diazepam]    Psychiatric reaction      Medication List        Accurate as of 09/01/18  8:57 AM. Always use your most recent med list.          acetaminophen 325 MG tablet Commonly known as:  TYLENOL Take 650 mg by mouth every 6 (six) hours as needed.   diphenhydrAMINE 25 mg capsule Commonly known as:  BENADRYL Take 25 mg by mouth every 6 (six) hours as needed.   fluticasone 50 MCG/ACT nasal spray Commonly known as:  FLONASE Place into the nose.   levothyroxine 75 MCG tablet Commonly known as:  SYNTHROID, LEVOTHROID Take 1 tablet (75 mcg total) daily by mouth.   multivitamin tablet Take 1 tablet by mouth daily.   PROAIR HFA 108 (90 Base) MCG/ACT inhaler Generic drug:  albuterol Inhale 1-2 puffs into the lungs every 6 (six) hours as needed. Reported on 05/15/2016      Hearing: Whisper test, no barriers identified.   Depression screen [facility] 2/9 09/01/2018 08/30/2017 08/27/2016 02/07/2016  Decreased Interest 0 0 0 0  Down, Depressed, Hopeless 0 0 0 0  PHQ - 2 Score 0 0 0 0   No flowsheet data found.  Cognitive assessment:  Word recall (daisy, blue, church): 3/3  Immunizations: Immunization History  Administered Date(s) Administered  . Influenza, High Dose Seasonal PF 07/24/2016,  08/08/2017, 08/14/2018  . Influenza,inj,Quad PF,6+ Mos 08/01/2015  . Pneumococcal Conjugate-13 07/28/2015  . Pneumococcal Polysaccharide-23 08/27/2016  . Td 05/07/2016  . Zoster Recombinat (Shingrix) 06/21/2018, 08/25/2018    Home Exercise  08/30/2017 08/27/2016  Current Exercise Habits Home exercise routine The patient does not participate in regular exercise at present  Type of exercise walking -  Time (Minutes) 30 -  Frequency (Times/Week) 6 -  Weekly Exercise (Minutes/Week) 180 -  Intensity Mild -  Exercise limited by: - None identified    Diet: Regular  Functional Status Survey: Is the patient deaf or have difficulty hearing?: No Does the patient have difficulty seeing, even when wearing glasses/contacts?: No Does the patient have difficulty concentrating, remembering, or making decisions?:  No Does the patient have difficulty walking or climbing stairs?: No Does the patient have difficulty dressing or bathing?: No Does the patient have difficulty doing errands alone such as visiting a doctor's office or shopping?: No  Get up and go test: steady and less than 20 seconds. Normal.      Fall Risk  09/01/2018 08/30/2017 08/27/2016 02/07/2016  Falls in the past year? 0 No No No  Follow up Falls evaluation completed - - -   Advanced Care Planning: N Packet given to her today to complete and return.   Cardiovascular Screening Blood Tests Lipid Panel     Component Value Date/Time   CHOL 169 08/30/2017 0856   TRIG 68.0 08/30/2017 0856   HDL 48.00 08/30/2017 0856   CHOLHDL 4 08/30/2017 0856   VLDL 13.6 08/30/2017 0856   LDLCALC 107 (H) 08/30/2017 0856   LDLDIRECT 143.5 05/14/2012 0805    Diabetes Screening Tests Lab Results  Component Value Date   HGBA1C 5.7 08/30/2017   BP 122/75 (BP Location: Right Arm, Patient Position: Sitting, Cuff Size: Normal)   Pulse 60   Temp 97.8 F (36.6 C) (Oral)   Resp 16   Ht _0  (1.575 m)   Wt 114 lb (51.7 kg)   SpO2 97%   BMI  20.85 kg/m  Gen: Afebrile. No acute distress.  Nontoxic in appearance.  Thin, Caucasian female. HENT: AT. [place]. Bilateral TM visualized and normal in appearance. MMM. Bilateral nares with erythema and dried bloody nose left nare. Throat without erythema or exudates.  No Cough.  No hoarseness. Eyes:Pupils Equal Round Reactive to light, Extraocular movements intact,  Conjunctiva without redness, discharge or icterus. Neck/lymp/endocrine: Supple, no lymphadenopathy, no thyromegaly CV: RRR no murmur, no edema, +2/4 P posterior tibialis pulses Chest: CTAB, no wheeze or crackles Abd: Soft.  Flat. NTND. BS present.  No masses palpated.  Skin: No rashes, purpura or petechiae.  Neuro:  Normal gait. PERLA. EOMi. Alert. Oriented. Cranial nerves II through XII intact. Psych: Normal affect, dress and demeanor. Normal speech. Normal thought content and judgment.   AAA screen: completed if female 42- 27 and if ever smoked and patient agreed to screening. Never smoker-N/A EKG screen: Completed only if felt indicated during initial medicare visit and patient agreed to screening. N/A Alcohol abuse screening: completed if indicated-- N/A STIs screening: Completed if indicated--N/A Assessment and Plan: Hypothyroidism, unspecified type Refills will be prescribed after lab results received.  Reports compliance with levothyroxine 75 mcg daily. - TSH - lipids Iron deficiency anemia, unspecified iron deficiency anemia type - CBC Vitamin D deficiency - Vitamin D (25 hydroxy) Encounter for long-term (current) use of medications - Comp Met (CMET) Encounter for Medicare annual wellness exam Colonoscopy: completed 05/2015, by [facility], resutls hemorrhoid. follow up 5 years. Mammogram: completed:08/18/2018, birads 1.  Cervical cancer screening: last pap: 2017, she is being followed by GYN.  Immunizations: tdap 08/2016, Influenza 07/2018 (encouraged yearly), PNA series 07/28/2015/08/27/2016 completed. Shingrix completed  08/25/2018 Infectious disease screening: completed DEXA: last completed 2017, result normal, follow needed GYN Patient scored normal on her cognitive screening. Hearing test is normal. He is up-to-date with eye exams and had a recent ophthalmology appointment last month.  F/u 1 year with MW- health coach F/u 1 year with provider for [facility]  Electronically Signed by: Howard Pouch, DO [facility]- [place]

## 2018-09-02 ENCOUNTER — Other Ambulatory Visit: Payer: Self-pay

## 2018-09-02 ENCOUNTER — Telehealth: Payer: Self-pay | Admitting: Family Medicine

## 2018-09-02 MED ORDER — VITAMIN D (ERGOCALCIFEROL) 1.25 MG (50000 UNIT) PO CAPS: 50000.0000 [IU] | ORAL_CAPSULE | ORAL | 0 refills | Status: DC

## 2018-09-02 MED ORDER — LEVOTHYROXINE SODIUM 75 MCG PO TABS: 75.0000 ug | ORAL_TABLET | Freq: Every day | ORAL | 3 refills | Status: DC

## 2018-09-02 NOTE — Telephone Encounter (Signed)
Please inform patient the following information: Her labs look excellent, except her vitamin D is still low at 23.  I have prescribed the ONCE weekly supplement to take with meals to get her levels up to normal and she should also take the OTC 1000u daily with food (sent to [facility]).  I also refilled her thyroid medicine- I sent it to the [facility] she had listed- please ask her if that is correct or did she want it at mail in pharmacy listed [phone] so send there please). If she is not using the mail in pharmacy any longer please delete it.

## 2018-09-02 NOTE — Telephone Encounter (Signed)
Patient notified and verbalized understanding. Patient using both pharmacy's.

## 2018-11-07 ENCOUNTER — Other Ambulatory Visit: Payer: Self-pay | Admitting: Family Medicine

## 2018-11-07 MED ORDER — LEVOTHYROXINE SODIUM 75 MCG PO TABS: 75.0000 ug | ORAL_TABLET | Freq: Every day | ORAL | 1 refills | Status: DC

## 2018-11-07 NOTE — Telephone Encounter (Signed)
Copied from [place] [id number]. Topic: Quick Communication - Rx Refill/Question >> Nov 07, 2018 10:34 AM Blase Mess A wrote: Medication: levothyroxine (SYNTHROID, LEVOTHROID) 75 MCG tablet [[id number]]   Has the patient contacted their pharmacy? Yes  (Agent: If no, request that the patient contact the pharmacy for the refill.) (Agent: If yes, when and what did the pharmacy advise?)  Preferred Pharmacy (with phone number or street name): [facility] [place] [place] [facility] [phone] (Phone) [phone] (Fax)    Agent: Please be advised that RX refills may take up to 3 business days. We ask that you follow-up with your pharmacy.

## 2018-12-24 ENCOUNTER — Encounter: Payer: Self-pay | Admitting: Physician Assistant

## 2018-12-24 ENCOUNTER — Other Ambulatory Visit: Payer: Self-pay

## 2018-12-24 ENCOUNTER — Ambulatory Visit: Payer: Self-pay

## 2018-12-24 ENCOUNTER — Ambulatory Visit (INDEPENDENT_AMBULATORY_CARE_PROVIDER_SITE_OTHER): Payer: Medicare Other | Admitting: Physician Assistant

## 2018-12-24 VITALS — BP 120/60 | HR 86 | Temp 100.0°F | Resp 14 | Ht 62.0 in | Wt 106.0 lb

## 2018-12-24 DIAGNOSIS — Z20828 Contact with and (suspected) exposure to other viral communicable diseases: Secondary | ICD-10-CM | POA: Diagnosis not present

## 2018-12-24 DIAGNOSIS — R509 Fever, unspecified: Secondary | ICD-10-CM | POA: Diagnosis not present

## 2018-12-24 LAB — POC INFLUENZA A&B (BINAX/QUICKVUE)
Influenza A, POC: NEGATIVE
Influenza B, POC: NEGATIVE

## 2018-12-24 MED ORDER — OSELTAMIVIR PHOSPHATE 75 MG PO CAPS: 75.0000 mg | ORAL_CAPSULE | Freq: Two times a day (BID) | ORAL | 0 refills | Status: DC

## 2018-12-24 MED ORDER — BENZONATATE 100 MG PO CAPS: 100.0000 mg | ORAL_CAPSULE | Freq: Three times a day (TID) | ORAL | 0 refills | Status: DC | PRN

## 2018-12-24 NOTE — Telephone Encounter (Signed)
Had staff call patient to have her come on in now as she is elderly with a very high fever and I do not want her waiting 2+ more hours for assessment.

## 2018-12-24 NOTE — Patient Instructions (Signed)
Please keep hydrated and get plenty of rest. Continue your OTC medications as directed for symptoms. Take the Tamiflu as directed. Use the Tessalon as directed for cough.    Influenza, Adult Influenza is also called "the flu." It is an infection in the lungs, nose, and throat (respiratory tract). It is caused by a virus. The flu causes symptoms that are similar to symptoms of a cold. It also causes a high fever and body aches. The flu spreads easily from person to person (is contagious). Getting a flu shot (influenza vaccination) every year is the best way to prevent the flu. What are the causes? This condition is caused by the influenza virus. You can get the virus by:  Breathing in droplets that are in the air from the cough or sneeze of a person who has the virus.  Touching something that has the virus on it (is contaminated) and then touching your mouth, nose, or eyes. What increases the risk? Certain things may make you more likely to get the flu. These include:  Not washing your hands often.  Having close contact with many people during cold and flu season.  Touching your mouth, eyes, or nose without first washing your hands.  Not getting a flu shot every year. You may have a higher risk for the flu, along with serious problems such as a lung infection (pneumonia), if you:  Are older than 65.  Are pregnant.  Have a weakened disease-fighting system (immune system) because of a disease or taking certain medicines.  Have a long-term (chronic) illness, such as: ? Heart, kidney, or lung disease. ? Diabetes. ? Asthma.  Have a liver disorder.  Are very overweight (morbidly obese).  Have anemia. This is a condition that affects your red blood cells. What are the signs or symptoms? Symptoms usually begin suddenly and last 4-14 days. They may include:  Fever and chills.  Headaches, body aches, or muscle aches.  Sore throat.  Cough.  Runny or stuffy (congested)  nose.  Chest discomfort.  Not wanting to eat as much as normal (poor appetite).  Weakness or feeling tired (fatigue).  Dizziness.  Feeling sick to your stomach (nauseous) or throwing up (vomiting). How is this treated? If the flu is found early, you can be treated with medicine that can help reduce how bad the illness is and how long it lasts (antiviral medicine). This may be given by mouth (orally) or through an IV tube. Taking care of yourself at home can help your symptoms get better. Your doctor may suggest:  Taking over-the-counter medicines.  Drinking plenty of fluids. The flu often goes away on its own. If you have very bad symptoms or other problems, you may be treated in a hospital. Follow these instructions at home:     Activity  Rest as needed. Get plenty of sleep.  Stay home from work or school as told by your doctor. ? Do not leave home until you do not have a fever for 24 hours without taking medicine. ? Leave home only to visit your doctor. Eating and drinking  Take an ORS (oral rehydration solution). This is a drink that is sold at pharmacies and stores.  Drink enough fluid to keep your pee (urine) pale yellow.  Drink clear fluids in small amounts as you are able. Clear fluids include: ? Water. ? Ice chips. ? Fruit juice that has water added (diluted fruit juice). ? Low-calorie sports drinks.  Eat bland, easy-to-digest foods in small amounts as  you are able. These foods include: ? Bananas. ? Applesauce. ? Rice. ? Lean meats. ? Toast. ? Crackers.  Do not eat or drink: ? Fluids that have a lot of sugar or caffeine. ? Alcohol. ? Spicy or fatty foods. General instructions  Take over-the-counter and prescription medicines only as told by your doctor.  Use a cool mist humidifier to add moisture to the air in your home. This can make it easier for you to breathe.  Cover your mouth and nose when you cough or sneeze.  Wash your hands with soap and  water often, especially after you cough or sneeze. If you cannot use soap and water, use alcohol-based hand sanitizer.  Keep all follow-up visits as told by your doctor. This is important. How is this prevented?   Get a flu shot every year. You may get the flu shot in late summer, fall, or winter. Ask your doctor when you should get your flu shot.  Avoid contact with people who are sick during fall and winter (cold and flu season). Contact a doctor if:  You get new symptoms.  You have: ? Chest pain. ? Watery poop (diarrhea). ? A fever.  Your cough gets worse.  You start to have more mucus.  You feel sick to your stomach.  You throw up. Get help right away if you:  Have shortness of breath.  Have trouble breathing.  Have skin or nails that turn a bluish color.  Have very bad pain or stiffness in your neck.  Get a sudden headache.  Get sudden pain in your face or ear.  Cannot eat or drink without throwing up. Summary  Influenza ("the flu") is an infection in the lungs, nose, and throat. It is caused by a virus.  Take over-the-counter and prescription medicines only as told by your doctor.  Getting a flu shot every year is the best way to avoid getting the flu. This information is not intended to replace advice given to you by your health care provider. Make sure you discuss any questions you have with your health care provider. Document Released: 07/24/2008 Document Revised: 04/02/2018 Document Reviewed: 04/02/2018 Elsevier Interactive Patient Education  2019 [employer].

## 2018-12-24 NOTE — Telephone Encounter (Signed)
Pt with onset of symptoms last night. Pt c/o cough, headache, fever, vomiting, diarrhea, runny nose, scratchy throat, and noted a small amount of blood from her nose when she sneezed today.  Pt has been caring for her great grandchildren and their mother who has the flu. Pt denies SOB or difficulty breathing.  Pt temp taken during call 103.4 and temperature retaken to verify. No appts at PCP office. Called [facility] and spoke with Estill Bamberg who stated they have no openings today. Called [facility] location and pt accepted an appt with Raiford Noble PA at 3:00 pm. Pt given the address and phone number.   Reason for Disposition . [1] Fever > 101 F (38.3 C) AND [2] age > 72  Answer Assessment - Initial Assessment Questions 1. WORST SYMPTOM: "What is your worst symptom?" (e.g., cough, runny nose, muscle aches, headache, sore throat, fever)     Yesterday cough headache, fever, vomiting, diarrhea, runny nose, congested cough 2. ONSET: "When did your flu symptoms start?"      Last night 3. COUGH: "How bad is the cough?"       Frequent cough 4. RESPIRATORY DISTRESS: "Describe your breathing."      No SOB or wheezing 5. FEVER: "Do you have a fever?" If so, ask: "What is your temperature, how was it measured, and when did it start?"     Yes- chills, body aches 103.4 rechecked twice 6. EXPOSURE: "Were you exposed to someone with influenza?"       Grand daughter and their mother 43. FLU VACCINE: "Did you get a flu shot this year?"     yes 8. HIGH RISK DISEASE: "Do you any chronic medical problems?" (e.g., heart or lung disease, asthma, weak immune system, or other HIGH RISK conditions)     hypothyroidism 9. PREGNANCY: "Is there any chance you are pregnant?" "When was your last menstrual period?"     N/A 10. OTHER SYMPTOMS: "Do you have any other symptoms?"  (e.g., runny nose, muscle aches, headache, sore throat)       Scratchy throat, muscle aches, small amount of blood from nose when sneezing this  morning.  Protocols used: INFLUENZA - SEASONAL-A-AH

## 2018-12-24 NOTE — Progress Notes (Signed)
Patient presents to clinic today c/o 1 day of sudden onset myalgias, arthralgias, nasal congestion, headache and sore throat. Has noted fever with Tmax 103 this morning.Notes one episode of vomiting last night due to thick nasal drainage. None since. Notes her grandchildren and her great-grandchildren (keeps them often). Denies recent travel. Has taken her Flonase and Alka Seltzer Flu this morning.   Past Medical History:  Diagnosis Date  . Adnexal cyst 07/2013   Left-3cm-noted on CT abd/pelv--f/u pelvic u/s did not show anything (L ovary not visualized, likely secondary to bowel gas)-repeat pelvic u/s after 07/2014.  Marland Kitchen Anemia   . Asthma   . DDD (degenerative disc disease), cervical   . H/O angular cheilitis    Zinc normal, vit B12 low normal (2012)  . H/O vitamin D deficiency 2011   Came back to normal range with replacement therapydd  . History of adenomatous polyp of colon 2003, 2006, 2013;2016   Repeat 2013 showed tubular adenoma x 3, with no high grade dysplasia.  2016 no polyps-recall 5 yrs  . History of hiatal hernia   . Hyperlipidemia    per pt report 05/09/12; also on labs 06/2015.  Intolerant of pravachol 02/2016  . Hypothyroidism   . Kidney infection   . Lower leg pain    Bilateral, crampy--ABIs/dopplers NORMAL 04/2013.  . 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 PONV (postoperative nausea and vomiting)    "jerks" afterward  . Shortness of breath dyspnea     Current Outpatient Medications on File Prior to Visit  Medication Sig Dispense Refill  . acetaminophen (TYLENOL) 325 MG tablet Take 650 mg by mouth every 6 (six) hours as needed.    . Cholecalciferol (VITAMIN D3) 25 MCG (1000 UT) CAPS Take 1 capsule by mouth daily.    . fluticasone (FLONASE) 50 MCG/ACT nasal spray Place into the nose.    . levothyroxine (SYNTHROID, LEVOTHROID) 75 MCG tablet Take 1 tablet (75 mcg total) by mouth daily. 90 tablet 1  . Multiple Vitamin  (MULTIVITAMIN) tablet Take 1 tablet by mouth daily.    Marland Kitchen PROAIR HFA 108 (90 Base) MCG/ACT inhaler Inhale 1-2 puffs into the lungs every 6 (six) hours as needed. Reported on 05/15/2016     No current facility-administered medications on file prior to visit.     Allergies  Allergen Reactions  . Codeine Other (See Comments)    Not specified in old records.  Carlton Adam [Propoxyphene N-Acetaminophen] Other (See Comments)    Not specified in old records  . Hydrocodone Other (See Comments)    Not specified in old records  . Valium [Diazepam]     Psychiatric reaction    Family History  Problem Relation Age of Onset  . Cancer Mother        ovarian and throat cancer  . Diabetes Mother   . Alcohol abuse Father        alcoholism.  Died age 78  . Cancer Maternal Grandmother        breast cancer    Social History   Socioeconomic History  . Marital status: Married    Spouse name: Not on file  . Number of children: Not on file  . Years of education: Not on file  . Highest education level: Not on file  Occupational History  . Not on file  Social Needs  . Financial resource strain: Not on file  . Food insecurity:    Worry: Not on  file    Inability: Not on file  . Transportation needs:    Medical: Not on file    Non-medical: Not on file  Tobacco Use  . Smoking status: Never Smoker  . Smokeless tobacco: Never Used  Substance and Sexual Activity  . Alcohol use: No    Alcohol/week: 0.0 standard drinks  . Drug use: No  . Sexual activity: Not Currently  Lifestyle  . Physical activity:    Days per week: Not on file    Minutes per session: Not on file  . Stress: Not on file  Relationships  . Social connections:    Talks on phone: Not on file    Gets together: Not on file    Attends religious service: Not on file    Active member of club or organization: Not on file    Attends meetings of clubs or organizations: Not on file    Relationship status: Not on file  Other Topics  Concern  . Not on file  Social History Narrative   Married, 3 children, 6 grandchildren, 1 GGc.   Orig from this region.  Drives a school bus (X 31 yrs).   No T/A/Ds.   Caffeine: one cup coffee a day, 3 glasses of sweet tea per day.   No formal exercise.  She is active (NOT sedentary).            Review of Systems - See HPI.  All other ROS are negative.  BP 120/60   Pulse 86   Temp 100 F (37.8 C) (Oral)   Resp 14   Ht 5\' 2"  (1.575 m)   Wt 106 lb (48.1 kg)   SpO2 96%   BMI 19.39 kg/m   Physical Exam Vitals signs reviewed.  Constitutional:      Appearance: Normal appearance.  HENT:     Head: Normocephalic and atraumatic.     Right Ear: Tympanic membrane normal.     Left Ear: Tympanic membrane normal.     Nose: Congestion and rhinorrhea present.     Mouth/Throat:     Mouth: Mucous membranes are moist.     Pharynx: No posterior oropharyngeal erythema.  Eyes:     Conjunctiva/sclera: Conjunctivae normal.  Neck:     Musculoskeletal: Neck supple.  Cardiovascular:     Rate and Rhythm: Normal rate and regular rhythm.     Heart sounds: Normal heart sounds.  Pulmonary:     Effort: Pulmonary effort is normal.     Breath sounds: Normal breath sounds.  Neurological:     Mental Status: She is alert.    Assessment/Plan: 1. Fever, unspecified fever cause 2. Exposure to the flu Flu test negative but classic symptoms and known exposures. Start Tamiflu. Supportive measures and OTC medications reviewed.     Leeanne Rio, PA-C

## 2018-12-29 ENCOUNTER — Other Ambulatory Visit: Payer: Self-pay | Admitting: Family Medicine

## 2018-12-29 ENCOUNTER — Telehealth: Payer: Self-pay | Admitting: *Deleted

## 2018-12-29 MED ORDER — LEVOTHYROXINE SODIUM 75 MCG PO TABS: 75.0000 ug | ORAL_TABLET | Freq: Every day | ORAL | 1 refills | Status: DC

## 2018-12-29 NOTE — Telephone Encounter (Signed)
Rx for tamiflu was [facility] to [facility].  SW pt and she stated that she has already picked this medication up. She was calling about her thyroid medication. There is another phone note about that med. Please see other phone note.

## 2018-12-29 NOTE — Telephone Encounter (Signed)
[facility] - Client [place] Patient Name: Margaret Holmes Gender: Female DOB: [birth year]  Age: 78 Y 2 M 6 D Return Phone Number: [phone] (Primary) Address:  City/State/Zip: [place]  [address] Client [facility] - Client Client Site [facility] Physician Raoul Pitch, [place] Type Call Who Is Calling Patient / Member / Family / Caregiver Call Type Triage / Clinical Relationship To Patient Self Return Phone Number [phone] (Primary) Chief Complaint Headache Reason for Call Medication Question / Request Initial Comment Caller states her [facility] to [facility], [facility] She would like to get it changed, Pt has Flu symptoms, alot of cough, a headache and congestion. Translation No Nurse Assessment Guidelines Guideline Title Affirmed Question Affirmed Notes Nurse Date/Time (Eastern Time) Disp. Time Eilene Ghazi Time) Disposition Final User 12/27/2018 4:39:28 PM Attempt made - message left Stormy Card 12/27/2018 5:06:33 PM FINAL ATTEMPT MADE message left Yes Fredderick Phenix, RN, Lelan Pons

## 2018-12-29 NOTE — Telephone Encounter (Signed)
Copied from [place] [phone] [phone]. Topic: Quick Communication - Rx Refill/Question >> Dec 29, 2018 11:34 AM Blase Mess A wrote: Medication: levothyroxine (SYNTHROID, LEVOTHROID) 75 MCG tablet [[id number]]   Has the patient contacted their pharmacy? Yes  (Agent: If no, request that the patient contact the pharmacy for the refill.) (Agent: If yes, when and what did the pharmacy advise?)  Preferred Pharmacy (with phone number or street name): [facility] [place] [place] [facility] [phone] (Phone) [phone] (Fax)    Agent: Please be advised that RX refills may take up to 3 business days. We ask that you follow-up with your pharmacy.

## 2018-12-29 NOTE — Telephone Encounter (Signed)
SW pt, she would like to get RF for levothyroxine sent to [facility]. Rx sent. Pt advised and voiced understanding.

## 2018-12-31 ENCOUNTER — Ambulatory Visit (INDEPENDENT_AMBULATORY_CARE_PROVIDER_SITE_OTHER): Payer: Medicare Other | Admitting: Family Medicine

## 2018-12-31 ENCOUNTER — Encounter: Payer: Self-pay | Admitting: Family Medicine

## 2018-12-31 VITALS — BP 119/65 | HR 79 | Temp 98.0°F | Resp 16 | Ht 62.0 in | Wt 103.5 lb

## 2018-12-31 DIAGNOSIS — J0100 Acute maxillary sinusitis, unspecified: Secondary | ICD-10-CM | POA: Diagnosis not present

## 2018-12-31 DIAGNOSIS — R05 Cough: Secondary | ICD-10-CM

## 2018-12-31 DIAGNOSIS — R059 Cough, unspecified: Secondary | ICD-10-CM

## 2018-12-31 MED ORDER — DM-GUAIFENESIN ER 30-600 MG PO TB12: 1.0000 | ORAL_TABLET | Freq: Two times a day (BID) | ORAL | 0 refills | Status: DC

## 2018-12-31 MED ORDER — PREDNISONE 20 MG PO TABS: 20.0000 mg | ORAL_TABLET | Freq: Every day | ORAL | 0 refills | Status: DC

## 2018-12-31 MED ORDER — CEFDINIR 300 MG PO CAPS: 300.0000 mg | ORAL_CAPSULE | Freq: Two times a day (BID) | ORAL | 0 refills | Status: DC

## 2018-12-31 NOTE — Patient Instructions (Addendum)
Rest, hydrate.  nasal saline flushes daily.  Prednisone, omnicef, mucinex DM prescribed, take until completed.  If cough present it can last up to 6-8 weeks.  F/U 2 weeks of not improved.  Dr. Raoul Pitch   Sinusitis, Adult Sinusitis is soreness and swelling (inflammation) of your sinuses. Sinuses are hollow spaces in the bones around your face. They are located:  Around your eyes.  In the middle of your forehead.  Behind your nose.  In your cheekbones. Your sinuses and nasal passages are lined with a fluid called mucus. Mucus drains out of your sinuses. Swelling can trap mucus in your sinuses. This lets germs (bacteria, virus, or fungus) grow, which leads to infection. Most of the time, this condition is caused by a virus. What are the causes? This condition is caused by:  Allergies.  Asthma.  Germs.  Things that block your nose or sinuses.  Growths in the nose (nasal polyps).  Chemicals or irritants in the air.  Fungus (rare). What increases the risk? You are more likely to develop this condition if:  You have a weak body defense system (immune system).  You do a lot of swimming or diving.  You use nasal sprays too much.  You smoke. What are the signs or symptoms? The main symptoms of this condition are pain and a feeling of pressure around the sinuses. Other symptoms include:  Stuffy nose (congestion).  Runny nose (drainage).  Swelling and warmth in the sinuses.  Headache.  Toothache.  A cough that may get worse at night.  Mucus that collects in the throat or the back of the nose (postnasal drip).  Being unable to smell and taste.  Being very tired (fatigue).  A fever.  Sore throat.  Bad breath. How is this diagnosed? This condition is diagnosed based on:  Your symptoms.  Your medical history.  A physical exam.  Tests to find out if your condition is short-term (acute) or long-term (chronic). Your doctor may: ? Check your nose for growths  (polyps). ? Check your sinuses using a tool that has a light (endoscope). ? Check for allergies or germs. ? Do imaging tests, such as an MRI or CT scan. How is this treated? Treatment for this condition depends on the cause and whether it is short-term or long-term.  If caused by a virus, your symptoms should go away on their own within 10 days. You may be given medicines to relieve symptoms. They include: ? Medicines that shrink swollen tissue in the nose. ? Medicines that treat allergies (antihistamines). ? A spray that treats swelling of the nostrils. ? Rinses that help get rid of thick mucus in your nose (nasal saline washes).  If caused by bacteria, your doctor may wait to see if you will get better without treatment. You may be given antibiotic medicine if you have: ? A very bad infection. ? A weak body defense system.  If caused by growths in the nose, you may need to have surgery. Follow these instructions at home: Medicines  Take, use, or apply over-the-counter and prescription medicines only as told by your doctor. These may include nasal sprays.  If you were prescribed an antibiotic medicine, take it as told by your doctor. Do not stop taking the antibiotic even if you start to feel better. Hydrate and humidify   Drink enough water to keep your pee (urine) pale yellow.  Use a cool mist humidifier to keep the humidity level in your home above 50%.  Breathe  in steam for 10-15 minutes, 3-4 times a day, or as told by your doctor. You can do this in the bathroom while a hot shower is running.  Try not to spend time in cool or dry air. Rest  Rest as much as you can.  Sleep with your head raised (elevated).  Make sure you get enough sleep each night. General instructions   Put a warm, moist washcloth on your face 3-4 times a day, or as often as told by your doctor. This will help with discomfort.  Wash your hands often with soap and water. If there is no soap and  water, use hand sanitizer.  Do not smoke. Avoid being around people who are smoking (secondhand smoke).  Keep all follow-up visits as told by your doctor. This is important. Contact a doctor if:  You have a fever.  Your symptoms get worse.  Your symptoms do not get better within 10 days. Get help right away if:  You have a very bad headache.  You cannot stop throwing up (vomiting).  You have very bad pain or swelling around your face or eyes.  You have trouble seeing.  You feel confused.  Your neck is stiff.  You have trouble breathing. Summary  Sinusitis is swelling of your sinuses. Sinuses are hollow spaces in the bones around your face.  This condition is caused by tissues in your nose that become inflamed or swollen. This traps germs. These can lead to infection.  If you were prescribed an antibiotic medicine, take it as told by your doctor. Do not stop taking it even if you start to feel better.  Keep all follow-up visits as told by your doctor. This is important. This information is not intended to replace advice given to you by your health care provider. Make sure you discuss any questions you have with your health care provider. Document Released: 04/02/2008 Document Revised: 03/17/2018 Document Reviewed: 03/17/2018 Elsevier Interactive Patient Education  2019 [employer].

## 2018-12-31 NOTE — Progress Notes (Signed)
Margaret Holmes , 07-02-1941, 78 y.o., female MRN: [id number] Patient Care Team    Relationship Specialty Notifications Start End  [name], DO PCP - General Family Medicine  04/09/16   Richmond Campbell, MD Consulting Physician Gastroenterology  05/23/16   Salvadore Dom, MD Consulting Physician Obstetrics and Gynecology  05/23/16   Leroy Sea, MD Referring Physician Urology  08/27/16   [facility]  09/01/18     Chief Complaint  Patient presents with  . Nasal Congestion  . Cough    Cant sleep due to cough. Chest is tight.      Subjective: Pt presents for an OV with complaints of worsening cough of > 1 week duration.  Associated symptoms include chest tightness, productive cough, dizziness, headache, sore throat. She is not eating well.   Flu shot UTD. No travel. She was seen last week by another provider. She was exposed to the flu- but her test negative.  She did take the tamiflu. Tessalon perles were not helpful.   Depression screen [facility] 2/9 09/01/2018 09/01/2018 08/30/2017 08/27/2016 02/07/2016  Decreased Interest 0 0 0 0 0  Down, Depressed, Hopeless 0 0 0 0 0  PHQ - 2 Score 0 0 0 0 0    Allergies  Allergen Reactions  . Codeine Other (See Comments)    Not specified in old records.  Carlton Adam [Propoxyphene N-Acetaminophen] Other (See Comments)    Not specified in old records  . Hydrocodone Other (See Comments)    Not specified in old records  . Valium [Diazepam]     Psychiatric reaction   Social History   Social History Narrative   Married, 3 children, 6 grandchildren, 1 GGc.   Orig from this region.  Drives a school bus (X 31 yrs).   No T/A/Ds.   Caffeine: one cup coffee a day, 3 glasses of sweet tea per day.   No formal exercise.  She is active (NOT sedentary).            Past Medical History:  Diagnosis Date  . Adnexal cyst 07/2013   Left-3cm-noted on CT abd/pelv--f/u pelvic u/s did not show anything (L  ovary not visualized, likely secondary to bowel gas)-repeat pelvic u/s after 07/2014.  Marland Kitchen Anemia   . Asthma   . DDD (degenerative disc disease), cervical   . H/O angular cheilitis    Zinc normal, vit B12 low normal (2012)  . H/O vitamin D deficiency 2011   Came back to normal range with replacement therapydd  . History of adenomatous polyp of colon 2003, 2006, 2013;2016   Repeat 2013 showed tubular adenoma x 3, with no high grade dysplasia.  2016 no polyps-recall 5 yrs  . History of hiatal hernia   . Hyperlipidemia    per pt report 05/09/12; also on labs 06/2015.  Intolerant of pravachol 02/2016  . Hypothyroidism   . Kidney infection   . Lower leg pain    Bilateral, crampy--ABIs/dopplers NORMAL 04/2013.  . 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 PONV (postoperative nausea and vomiting)    "jerks" afterward  . Shortness of breath dyspnea    Past Surgical History:  Procedure Laterality Date  . BAND HEMORRHOIDECTOMY    . CHOLECYSTECTOMY  1980s  . COLONOSCOPY W/ BIOPSIES AND POLYPECTOMY  06/02/12;05/30/15   No polyps 2016-recall 5 yrs.  +internal hem.  Normal ileoscopy.  Marland Kitchen DILATATION & CURETTAGE/HYSTEROSCOPY WITH MYOSURE  N/A 05/28/2016   Procedure: DILATATION & CURETTAGE/HYSTEROSCOPY WITH MYOSURE with ULTRASOUND guidance;  Surgeon: Salvadore Dom, MD;  Location: [facility];  Service: Gynecology;  Laterality: N/A;  Please have ultrasound in room. Follow Silva's first case.  Marland Kitchen GANGLION CYST EXCISION  2003   Right wrist, with excision of some triquetrum spurs  . stent placed right kidney    . THYROIDECTOMY, PARTIAL  1972  . TONSILLECTOMY AND ADENOIDECTOMY  age 54  . TUBAL LIGATION    . WRIST SURGERY  2006   For left thumb carpo-metacarpal arthritis   Family History  Problem Relation Age of Onset  . Cancer Mother        ovarian and throat cancer  . Diabetes Mother   . Alcohol abuse Father        alcoholism.  Died age 17  . Cancer Maternal  Grandmother        breast cancer   Allergies as of 12/31/2018      Reactions   Codeine Other (See Comments)   Not specified in old records.   Darvocet [propoxyphene N-acetaminophen] Other (See Comments)   Not specified in old records   Hydrocodone Other (See Comments)   Not specified in old records   Valium [diazepam]    Psychiatric reaction      Medication List       Accurate as of December 31, 2018 11:09 AM. Always use your most recent med list.        acetaminophen 325 MG tablet Commonly known as:  TYLENOL Take 650 mg by mouth every 6 (six) hours as needed.   benzonatate 100 MG capsule Commonly known as:  TESSALON Take 1 capsule (100 mg total) by mouth 3 (three) times daily as needed for cough.   fluticasone 50 MCG/ACT nasal spray Commonly known as:  FLONASE Place into the nose.   levothyroxine 75 MCG tablet Commonly known as:  SYNTHROID, LEVOTHROID Take 1 tablet (75 mcg total) by mouth daily.   multivitamin tablet Take 1 tablet by mouth daily.   oseltamivir 75 MG capsule Commonly known as:  TAMIFLU Take 1 capsule (75 mg total) by mouth 2 (two) times daily.   PROAIR HFA 108 (90 Base) MCG/ACT inhaler Generic drug:  albuterol Inhale 1-2 puffs into the lungs every 6 (six) hours as needed. Reported on 05/15/2016   Vitamin D3 25 MCG (1000 UT) Caps Take 1 capsule by mouth daily.       All past medical history, surgical history, allergies, family history, immunizations andmedications were updated in the EMR today and reviewed under the history and medication portions of their EMR.     ROS: Negative, with the exception of above mentioned in HPI   Objective:  BP 119/65 (BP Location: Right Arm, Patient Position: Sitting, Cuff Size: Small)   Pulse 79   Temp 98 F (36.7 C) (Oral)   Resp 16   Ht 5\' 2"  (1.575 m)   Wt 103 lb 8 oz (46.9 kg)   SpO2 98%   BMI 18.93 kg/m  Body mass index is 18.93 kg/m. Gen: Afebrile. No acute distress. Nontoxic in appearance, well  developed, well nourished.  HENT: AT. [place]. Bilateral TM visualized no erythema, no fullness. MMM, no oral lesions. Bilateral nares with erythema, drainage present. Throat without erythema or exudates. Cough and hoarseness present. TTP left max sinus.  Eyes:Pupils Equal Round Reactive to light, Extraocular movements intact,  Conjunctiva without redness, discharge or icterus. Neck/lymp/endocrine: Supple,no lymphadenopathy CV: RRR  Chest: mild  rhonchi otherwise CTAB, no wheeze or crackles. Good air movement, normal resp effort.  Abd: Soft. NTND. BS present.  Skin: no rashes, purpura or petechiae.  Neuro: Normal gait. PERLA. EOMi. Alert. Oriented x3   No exam data present No results found. No results found for this or any previous visit (from the past 24 hour(s)).  Assessment/Plan: Margaret Holmes is a 78 y.o. female present for OV for  Cough/Acute non-recurrent maxillary sinusitis after influenza Rest, hydrate.  nasal saline flushes daily.  Prednisone, omnicef, mucinex DM prescribed, take until completed.  If cough present it can last up to 6-8 weeks.  F/U 2 weeks of not improved.    Reviewed expectations re: course of current medical issues.  Discussed self-management of symptoms.  Outlined signs and symptoms indicating need for more acute intervention.  Patient verbalized understanding and all questions were answered.  Patient received an After-Visit Summary.    No orders of the defined types were placed in this encounter.    Note is dictated utilizing voice recognition software. Although note has been proof read prior to signing, occasional typographical errors still can be missed. If any questions arise, please do not hesitate to call for verification.   electronically signed by:  Howard Pouch, DO  [place]

## 2019-01-12 ENCOUNTER — Ambulatory Visit: Payer: Self-pay | Admitting: Family Medicine

## 2019-01-12 ENCOUNTER — Telehealth: Payer: Self-pay | Admitting: Family Medicine

## 2019-01-12 NOTE — Telephone Encounter (Signed)
Called pt and LM to call back in regards to her symptoms

## 2019-01-12 NOTE — Telephone Encounter (Signed)
  I returned pt's call.   She was seen by Dr. Raoul Pitch on 12/31/2018 and dx with sinusitis.   Given prednisone, Cefdinir and cough medicine which she has completed.   She feels better but c/o  The drainage down the back of her throat really bothering her and she's getting hoarse again.  She was also seen on 12/24/2018 for the flu and given Tamiflu.   See triage notes below.  I have sent these notes to Dr. Lucita Lora nurse pool for further disposition.   Reason for Disposition . [1] Finished taking antibiotics AND [2] symptoms are BETTER but [3] not completely gone  Answer Assessment - Initial Assessment Questions 1. INFECTION: "What infection is the antibiotic being given for?"     Sinusitis was dx on  12/31/2018.   I'm still having draining down my throat.   I'm getting hoarse. 2. ANTIBIOTIC: "What antibiotic are you taking" "How many times per day?"     She's finished all the medication that was prescribed.  Cefdinir, prednisone and cough medicine.   3. DURATION: "When was the antibiotic started?"     12/31/2018 4. MAIN CONCERN OR SYMPTOM:  "What is your main concern right now?"     I'm much better but I'm still having the drainage down the back of my throat.  I'm getting hoarse again.   The cough is much better at night now but I'm using some Tessalon Perles that is helping when I have a problem with coughing since I completed the cough medicine Dr. Raoul Pitch gave me.  5. BETTER-SAME-WORSE: "Are you getting better, staying the same, or getting worse compared to when you first started the antibiotics?" If getting worse, ask: "In what way?"      I'm much better but still a little weak.   I'm drinking 64 oz of water a day.   I feel like I could use another round of the prednisone.   It seemed to help more than anything. 6. FEVER: "Do you have a fever?" If so, ask: "What is your temperature, how was it measured, and when did it start?"     No 7. SYMPTOMS: "Are there any other symptoms you're concerned  about?" If so, ask: "When did it start?"     Getting hoarse again, the drainage down my throat is still there and I'm still a little weak but better.  Sometimes I still feel a little tipsy. 8. FOLLOW-UP APPOINTMENT: "Do you have a follow-up appointment with your doctor?"     No.    I'm willing to come in if Dr. Raoul Pitch wants to see me.   If she thinks I just need more time I'm ok with that too but the drainage down the back of my throat is bothering me terrible.  Protocols used: INFECTION ON ANTIBIOTIC FOLLOW-UP CALL-A-AH

## 2019-01-12 NOTE — Telephone Encounter (Signed)
Called and spoke with patient. She stated she ran a low grade fever last night at 100.0, denies fever today, nasal congestion started back, thick but clear. Denies SOB, cough, chest pain. Pt took all medications as directed that were RX at last appt, continues with nasal spray (flonase) and saline spray daily. She has had some loose stools but did have to take Miralax due to no BM x3 days. Pt encouraged to drink lots of fluids, rest, and call if she has s/s such as SOB, chest pain, or high fever, or 911 if emergent. Pt verbalized understanding made appt for Wednesday. Pt cannot come sooner due to watching grandchildren.

## 2019-01-12 NOTE — Telephone Encounter (Signed)
Copied from [place] [id number]. Topic: Quick Communication - See Telephone Encounter >> Jan 12, 2019 11:29 AM Valla Leaver wrote: CRM for notification. See Telephone encounter for: 01/12/19. Patient missed call from [facility] regarding sinus symptoms. Please call patient back.

## 2019-01-14 ENCOUNTER — Encounter: Payer: Self-pay | Admitting: Family Medicine

## 2019-01-14 ENCOUNTER — Ambulatory Visit (INDEPENDENT_AMBULATORY_CARE_PROVIDER_SITE_OTHER): Payer: Medicare Other | Admitting: Family Medicine

## 2019-01-14 ENCOUNTER — Other Ambulatory Visit: Payer: Self-pay

## 2019-01-14 VITALS — BP 131/77 | HR 64 | Temp 98.1°F | Resp 16 | Ht 62.0 in | Wt 102.1 lb

## 2019-01-14 DIAGNOSIS — R091 Pleurisy: Secondary | ICD-10-CM

## 2019-01-14 DIAGNOSIS — R0781 Pleurodynia: Secondary | ICD-10-CM | POA: Diagnosis not present

## 2019-01-14 MED ORDER — NAPROXEN 500 MG PO TABS: 500.0000 mg | ORAL_TABLET | Freq: Two times a day (BID) | ORAL | 0 refills | Status: DC

## 2019-01-14 NOTE — Progress Notes (Signed)
Margaret Holmes , [birth year], 78 y.o., female MRN: [id number] Patient Care Team    Relationship Specialty Notifications Start End  [name], DO PCP - General Family Medicine  04/09/16   Richmond Campbell, MD Consulting Physician Gastroenterology  05/23/16   Salvadore Dom, MD Consulting Physician Obstetrics and Gynecology  05/23/16   Leroy Sea, MD Referring Physician Urology  08/27/16   [facility]  09/01/18     Chief Complaint  Patient presents with  . Follow-up    Pt is having soreness in her chest and she thinks it is from coughing. It hurts when she is taking a deep breath and she does get SOB with activity      Subjective:  Margaret Holmes is 78 y.o. female presents for follow-up on recent upper respiratory infection and cough.  Patient was treated with prednisone and Omnicef.  She states she is feeling much better today.  She is getting back to her normal activity.  She has had no fevers or chills.  Her cough is much improved.  However she has soreness in her back and on the right side of her chest that she believes is from coughing so much.  She also still is feeling mildly winded with activity. Prior note: Pt presents for an OV with complaints of worsening cough of > 1 week duration.  Associated symptoms include chest tightness, productive cough, dizziness, headache, sore throat. She is not eating well.   Flu shot UTD. No travel. She was seen last week by another provider. She was exposed to the flu- but her test negative.  She did take the tamiflu. Tessalon perles were not helpful.   Depression screen [facility] 2/9 09/01/2018 09/01/2018 08/30/2017 08/27/2016 02/07/2016  Decreased Interest 0 0 0 0 0  Down, Depressed, Hopeless 0 0 0 0 0  PHQ - 2 Score 0 0 0 0 0    Allergies  Allergen Reactions  . Codeine Other (See Comments)    Not specified in old records.  Carlton Adam [Propoxyphene N-Acetaminophen] Other (See Comments)    Not  specified in old records  . Hydrocodone Other (See Comments)    Not specified in old records  . Valium [Diazepam]     Psychiatric reaction   Social History   Social History Narrative   Married, 3 children, 6 grandchildren, 1 GGc.   Orig from this region.  Drives a school bus (X 31 yrs).   No T/A/Ds.   Caffeine: one cup coffee a day, 3 glasses of sweet tea per day.   No formal exercise.  She is active (NOT sedentary).            Past Medical History:  Diagnosis Date  . Adnexal cyst 07/2013   Left-3cm-noted on CT abd/pelv--f/u pelvic u/s did not show anything (L ovary not visualized, likely secondary to bowel gas)-repeat pelvic u/s after 07/2014.  Marland Kitchen Anemia   . Asthma   . DDD (degenerative disc disease), cervical   . H/O angular cheilitis    Zinc normal, vit B12 low normal (2012)  . H/O vitamin D deficiency 2011   Came back to normal range with replacement therapydd  . History of adenomatous polyp of colon 2003, 2006, 2013;2016   Repeat 2013 showed tubular adenoma x 3, with no high grade dysplasia.  2016 no polyps-recall 5 yrs  . History of hiatal hernia   . Hyperlipidemia    per pt report 05/09/12; also  on labs 06/2015.  Intolerant of pravachol 02/2016  . Hypothyroidism   . Kidney infection   . Lower leg pain    Bilateral, crampy--ABIs/dopplers NORMAL 04/2013.  . 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 PONV (postoperative nausea and vomiting)    "jerks" afterward  . Shortness of breath dyspnea    Past Surgical History:  Procedure Laterality Date  . BAND HEMORRHOIDECTOMY    . CHOLECYSTECTOMY  1980s  . COLONOSCOPY W/ BIOPSIES AND POLYPECTOMY  06/02/12;05/30/15   No polyps 2016-recall 5 yrs.  +internal hem.  Normal ileoscopy.  Marland Kitchen DILATATION & CURETTAGE/HYSTEROSCOPY WITH MYOSURE N/A 05/28/2016   Procedure: DILATATION & CURETTAGE/HYSTEROSCOPY WITH MYOSURE with ULTRASOUND guidance;  Surgeon: Salvadore Dom, MD;  Location: [facility];   Service: Gynecology;  Laterality: N/A;  Please have ultrasound in room. Follow Silva's first case.  Marland Kitchen GANGLION CYST EXCISION  2003   Right wrist, with excision of some triquetrum spurs  . stent placed right kidney    . THYROIDECTOMY, PARTIAL  1972  . TONSILLECTOMY AND ADENOIDECTOMY  age 26  . TUBAL LIGATION    . WRIST SURGERY  2006   For left thumb carpo-metacarpal arthritis   Family History  Problem Relation Age of Onset  . Cancer Mother        ovarian and throat cancer  . Diabetes Mother   . Alcohol abuse Father        alcoholism.  Died age 36  . Cancer Maternal Grandmother        breast cancer   Allergies as of 01/14/2019      Reactions   Codeine Other (See Comments)   Not specified in old records.   Darvocet [propoxyphene N-acetaminophen] Other (See Comments)   Not specified in old records   Hydrocodone Other (See Comments)   Not specified in old records   Valium [diazepam]    Psychiatric reaction      Medication List       Accurate as of January 14, 2019  3:58 PM. Always use your most recent med list.        acetaminophen 325 MG tablet Commonly known as:  TYLENOL Take 650 mg by mouth every 6 (six) hours as needed.   cetirizine 10 MG tablet Commonly known as:  ZYRTEC Take 10 mg by mouth daily.   fluticasone 50 MCG/ACT nasal spray Commonly known as:  FLONASE Place into the nose.   levothyroxine 75 MCG tablet Commonly known as:  SYNTHROID, LEVOTHROID Take 1 tablet (75 mcg total) by mouth daily.   multivitamin tablet Take 1 tablet by mouth daily.   predniSONE 20 MG tablet Commonly known as:  DELTASONE Take 1 tablet (20 mg total) by mouth daily with breakfast.   ProAir HFA 108 (90 Base) MCG/ACT inhaler Generic drug:  albuterol Inhale 1-2 puffs into the lungs every 6 (six) hours as needed. Reported on 05/15/2016   Vitamin D3 25 MCG (1000 UT) Caps Take 1 capsule by mouth daily.       All past medical history, surgical history, allergies, family  history, immunizations andmedications were updated in the EMR today and reviewed under the history and medication portions of their EMR.     ROS: Negative, with the exception of above mentioned in HPI   Objective:  BP 131/77 (BP Location: Right Arm, Patient Position: Sitting, Cuff Size: Normal)   Pulse 64   Temp 98.1 F (36.7 C) (Oral)   Resp 16  Ht 5\' 2"  (1.575 m)   Wt 102 lb 2 oz (46.3 kg)   SpO2 98%   BMI 18.68 kg/m  Body mass index is 18.68 kg/m. Gen: Afebrile. No acute distress.  Nontoxic in appearance, well-developed, well-nourished, thin Caucasian female. HENT: AT. [place]. . MMM.  Eyes:Pupils Equal Round Reactive to light, Extraocular movements intact,  Conjunctiva without redness, discharge or icterus. Neck/lymp/endocrine: Supple, no lymphadenopathy CV: RRR, no edema, Chest: CTAB, no wheeze or crackles.  Good air movement.  Normal respiratory effort. MSK: Chest wall mildly tender to palpation right medial chest wall and left thoracic tenderness over T4- 7 area.  Deep inspiration causes discomfort left thoracic. Skin: No rashes, purpura or petechiae.  Neuro:  Normal gait. PERLA. EOMi. Alert. Oriented x3  No exam data present No results found. No results found for this or any previous visit (from the past 24 hour(s)).  Assessment/Plan: Margaret Holmes is a 78 y.o. female present for OV for  Rib pain/Pleurisy Overall patient's condition has greatly improved.  SHe only has a very minimal cough and no further signs of infection. -It is most likely that her discomfort is from her cough, not certain if she could have fractured ribs, she is other small framed but has no osteopenia/osteoporosis.  Discussed trial of NSAIDs prior to imaging.  She agreed to this plan.  Naproxen 500 mg twice daily with food prescribed to schedule routinely 3 to 5 days and then use as needed. -If symptoms are not resolved in 1 week, or worsening follow-up and we will start with imaging.   Reviewed  expectations re: course of current medical issues.  Discussed self-management of symptoms.  Outlined signs and symptoms indicating need for more acute intervention.  Patient verbalized understanding and all questions were answered.  Patient received an After-Visit Summary.    No orders of the defined types were placed in this encounter.    Note is dictated utilizing voice recognition software. Although note has been proof read prior to signing, occasional typographical errors still can be missed. If any questions arise, please do not hesitate to call for verification.   electronically signed by:  Howard Pouch, DO  [place]

## 2019-01-14 NOTE — Patient Instructions (Addendum)
Naproxen- take every 12 hours with food for 3 days--> this should help with your discomfort.   If fever, chills, shortness of breath or pain continues more than 1 week or worsens--> please be seen and we will need to get a xray.    Pleurisy Pleurisy is irritation and swelling (inflammation) of the linings of your lungs (pleura). This can cause pain in your chest, back, or shoulder. It can also cause trouble breathing. Follow these instructions at home: Medicines  Take over-the-counter and prescription medicines only as told by your doctor.  If you were prescribed antibiotic medicine, take it as told by your doctor. Do not stop taking the antibiotic even if you start to feel better. Activity  Rest and return to your normal activities as told by your doctor. Ask your doctor what activities are safe for you.  Do not drive or use heavy machinery while taking prescription pain medicine. General instructions   Watch for any changes in your condition.  Take deep breaths often, even if it is painful. This can help prevent lung problems.  When lying down, lie on your painful side. This may help you feel less pain.  Do not smoke. If you need help quitting, ask your doctor.  Keep all follow-up visits as told by your doctor. This is important. Contact a doctor if:  You have pain that: ? Gets worse. ? Does not get better with medicine. ? Lasts for more than 1 week.  You have a fever or chills.  You have a cough that does not get better at home.  You have trouble breathing that does not get better at home.  You cough up liquid that looks like pus (purulent secretions). Get help right away if:  Your lips, fingernails, or toenails turn dark or turn blue.  You cough up blood.  You have trouble breathing that gets worse.  You are making loud noises when you breathe (wheezing) and this gets worse.  You have pain that spreads to your neck, arms, or jaw.  You get a rash.  You  throw up (vomit).  You pass out (faint). Summary  Pleurisy is irritation and swelling (inflammation) of the linings of your lungs (pleura).  Pleurisy can cause pain and trouble breathing.  If you have a cough that does not get better at home, contact your doctor.  Get help right away if you are having trouble breathing and it is getting worse. This information is not intended to replace advice given to you by your health care provider. Make sure you discuss any questions you have with your health care provider. Document Released: 09/27/2008 Document Revised: 07/09/2016 Document Reviewed: 07/09/2016 Elsevier Interactive Patient Education  [employer].

## 2019-01-15 ENCOUNTER — Encounter: Payer: Self-pay | Admitting: Family Medicine

## 2019-01-19 ENCOUNTER — Telehealth: Payer: Self-pay

## 2019-01-19 MED ORDER — DICLOFENAC SODIUM 1 % TD GEL: TRANSDERMAL | 5 refills | Status: DC

## 2019-01-19 NOTE — Telephone Encounter (Signed)
Diclofenac comes in multiple strengths. She did not provide info on the strength. Please ask her or call the pharmacy for verification

## 2019-01-19 NOTE — Telephone Encounter (Signed)
Pt called triage over the weekend and stated she had cut her finger when cutting firewood. Pt wanted to know when she had her last Td, which was 2017. Pt stated it stopped bleeding rather quickly and she washed with warm water and Dial soap, placed Neosporin on it, and wrapped with gauze and tape. Pt stated it was not very deep, no meat/bone exposure. Pt taught s/s of infection (warm to tough, redness, discharge, odor, and pain) to keep wound clean and dry (using dial soap), using neosporin, and keeping covered. Pt verbalized understanding.  Pt wanted Dr Raoul Pitch to know the name of the medication that has helped with her muscle/back pain. Diclofenac Sodium. She uses PRN. She told Dr Raoul Pitch she would call when she got home and get the name of it. She is almost out of med and did not know if Dr Raoul Pitch would refill. Dr Jamesetta Orleans RX originally from [facility].

## 2019-01-19 NOTE — Addendum Note (Signed)
Addended by: Howard Pouch A on: 01/19/2019 03:45 PM   Modules accepted: Orders

## 2019-01-19 NOTE — Telephone Encounter (Signed)
Pt was called and notified that RX was faxed to [facility] in [place] per pts request.

## 2019-01-19 NOTE — Telephone Encounter (Signed)
Printed. It is 1%. The  2% no longer a script.  Uncertain if she wants local or mail in--> printed.

## 2019-01-19 NOTE — Telephone Encounter (Signed)
Diclofenac Sodium 2% ointment

## 2019-01-22 ENCOUNTER — Telehealth: Payer: Self-pay | Admitting: Family Medicine

## 2019-01-22 NOTE — Telephone Encounter (Signed)
Copied from [place] [phone]. Topic: Quick Communication - See Telephone Encounter >> Jan 22, 2019  8:55 AM Reyne Dumas L wrote: CRM for notification. See Telephone encounter for: 01/22/19.  Pt called and left voicemail on [place] on 01/21/2019.  States that she is still having sinus problems, cough, and sore throat.  Pt wants to know if doctor would recommend she continue taking medication or come back in for office visit. Pt did not leave a return number, but caller ID identified number used to make call as [phone]

## 2019-01-22 NOTE — Telephone Encounter (Signed)
Last OV 01/14/2019. Pt was to call back if not better after a week to discuss imaging. Please advise.

## 2019-01-23 ENCOUNTER — Encounter: Payer: Self-pay | Admitting: Family Medicine

## 2019-01-23 ENCOUNTER — Ambulatory Visit (INDEPENDENT_AMBULATORY_CARE_PROVIDER_SITE_OTHER): Payer: Medicare Other | Admitting: Family Medicine

## 2019-01-23 ENCOUNTER — Other Ambulatory Visit: Payer: Self-pay

## 2019-01-23 DIAGNOSIS — J0100 Acute maxillary sinusitis, unspecified: Secondary | ICD-10-CM

## 2019-01-23 MED ORDER — LEVOFLOXACIN 500 MG PO TABS: 500.0000 mg | ORAL_TABLET | Freq: Every day | ORAL | 0 refills | Status: DC

## 2019-01-23 NOTE — Patient Instructions (Signed)
Telehealth

## 2019-01-23 NOTE — Progress Notes (Signed)
Virtual Visit via Telephone Note  I connected with Margaret Holmes on 01/23/19 at  2:30 PM EDT by telephone and verified that I am speaking with the correct person using two identifiers.   I discussed the limitations, risks, security and privacy concerns of performing an evaluation and management service by telephone and the availability of in person appointments. I also discussed with the patient that there may be a patient responsible charge related to this service. The patient expressed understanding and agreed to proceed.  Chief Complaint  Patient presents with  . Cough    When pt takes deep breath she gets sharp pain that goes across ribs on right side.   Marland Kitchen Headache  . Nasal Congestion     History of Present Illness: Patient complains today of continued productive cough that is worse when laying flat and in the morning.  She also states she has increased nasal congestion and headache.  She has been using the Flonase and her Zyrtec.  She has completed a course of prednisone, Omnicef.  Has been seen on 12/31/2018 with sinusitis and then on 01/14/2023 rib discomfort/pleurisy.  Pleurisy  was treated with naproxen twice daily and that has improved, although she does still have some right sided back discomfort but has been splitting wood the last few days.  SHe is also taking an over-the-counter cough suppressant. She denies any fever, chills, nausea, vomit, bowel changes or dizziness.  She has had some ear pressure, but not so much today.   Observations/Objective: Gen:  No acute distress.  HENT: Sounds congested today. Chest: Cough present during visit.Marland Kitchen  Neuro: Alert. Oriented x3   Assessment and Plan: Persistent non-recurrent maxillary sinusitis Patient's upper respiratory symptoms have basically started to return or at least plateaued since finishing the antibiotics and prednisone.  She is still having a daily cough that is productive.  She has nasal congestion and sinus pressure pain  backing into her ears. He had been treated with doxycycline, prednisone, Flonase, Zyrtec, Tessalon Perles and naproxen. -Levaquin 500 mg daily prescribed today.  For 7 days.  Continue Flonase, Zyrtec and Tessalon Perles. -Continue to rest and hydrate.   Follow Up Instructions: Follow-up 2 weeks if symptoms are not resolved, sooner if worsening.    I discussed the assessment and treatment plan with the patient. The patient was provided an opportunity to ask questions and all were answered. The patient agreed with the plan and demonstrated an understanding of the instructions.   The patient was advised to call back or seek an in-person evaluation if the symptoms worsen or if the condition fails to improve as anticipated.  I provided 12 minutes of non-face-to-face time during this encounter.   Howard Pouch, DO

## 2019-01-23 NOTE — Telephone Encounter (Signed)
Please schedule video or phone visit

## 2019-01-23 NOTE — Telephone Encounter (Signed)
Patient has been scheduled

## 2019-01-23 NOTE — Telephone Encounter (Signed)
Please set up for video or phone visit.

## 2019-01-27 ENCOUNTER — Ambulatory Visit: Payer: Self-pay

## 2019-01-27 NOTE — Telephone Encounter (Signed)
Pt. left voice message of feeling more weak on COVID 19 Question line.  Returned call to pt.  Reported several symptoms. C/o sore throat, post nasal drip, intermittent chest discomfort between breasts, some increased shortness of breath, nausea, and dry heaves. Also c/o feeling more weak.  Stated "it hurts to take a deep breath from between my breasts and in my back"; rated at 5/10.  Questioned if her chest pain radiates anywhere?  Stated "I also hurt in my shoulders and on my left rib area."  Stated "I'm getting sicker by the minute."  Temperature 100 degrees, at this time.  Stated she feels hot one minute and cold the next.  Questioned if the antibiotic, Levaquin is making her feel worse?  Advised pt. To go to the ER due to increased pain in chest and back, shortness of breath, and weakness.   Stated "my stomach is so upset right now, and I'm going to wait until that passes, before I go to the ER.  Advised to check in at the [facility], outside the ER, and apply a mask, and the staff will evaluate and advised where she should go, at that point. Pt. Verb. Understanding.  Agreed with plan.

## 2019-01-29 ENCOUNTER — Telehealth: Payer: Self-pay | Admitting: Family Medicine

## 2019-01-29 NOTE — Telephone Encounter (Signed)
Pt was called and states she feels better. She has not had a fever, has some nasal drainage in the morning, and a slight cough that goes away once she is up. She is still using flonase daily. She states she has gotten rest over the past couple of days and is not watching her grankids right now. She is drinking fluids and eating better. Pt educated on when to go to ED if symptoms worsen. Pt verbalized understanding

## 2019-01-29 NOTE — Telephone Encounter (Signed)
Please call Margaret Holmes and check on her. Nurse triage sent her to the ED for worsening symptoms and fever--> I do not see evidence she went and I am concerned for her. If she is worsening and not improving on her antibiotics then she needs to go the ED to be evaluated especially with her COVID19, her age and she takes care of her grandkids.

## 2019-04-01 ENCOUNTER — Telehealth: Payer: Self-pay | Admitting: Obstetrics and Gynecology

## 2019-04-01 NOTE — Telephone Encounter (Signed)
Patient returning call.

## 2019-04-01 NOTE — Telephone Encounter (Signed)
Call placed to patient to schedule one year follow up ultrasound. Left voicemail message requesting a return call

## 2019-04-01 NOTE — Telephone Encounter (Signed)
Returned call to patient to schedule one year follow up ultrasound. Patient is scheduled 04/16/2019 with Dr Talbert Nan. Will close encounter

## 2019-04-13 DIAGNOSIS — Z03818 Encounter for observation for suspected exposure to other biological agents ruled out: Secondary | ICD-10-CM | POA: Diagnosis not present

## 2019-04-14 ENCOUNTER — Other Ambulatory Visit: Payer: Self-pay

## 2019-04-15 NOTE — Progress Notes (Signed)
GYNECOLOGY  VISIT   HPI: 78 y.o.   Married White or Caucasian Not Hispanic or Latino  female   [phone] with No LMP recorded. Patient is postmenopausal.   here for consult following PUS.    The patient is being followed for a simple left adnexal cyst first noted in 10/14 on CT 3 cm.   She had a f/u ultrasound that was negative for an ovarian cyst. Her left ovary was not seen. She then had a CT scan in 5/17 that showed a 3.6 x 3.7 left adnexal cyst. F/U ultrasound in 6/17 showed a 3.8 x 3,7 x 3.1 cm left simple ovarian cyst. U/S in 10/17: 3.7 x 3.3 x 3.4 cm left ovarian cyst, simple.  CA 125 was normal at 29 in 6/17.   GYNECOLOGIC HISTORY: No LMP recorded. Patient is postmenopausal. Contraception:Postmenopausal Menopausal hormone therapy: None        OB History    Gravida  3   Para  3   Term  3   Preterm      AB      Living  3     SAB      TAB      Ectopic      Multiple      Live Births  3              Patient Active Problem List   Diagnosis Date Noted  . Encounter for Medicare annual wellness exam 09/01/2018  . 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 Vitamin D deficiency 08/28/2016  . Anemia   . Hyperlipidemia 02/07/2016  . Hypothyroidism 05/11/2012    Past Medical History:  Diagnosis Date  . Adnexal cyst 07/2013   Left-3cm-noted on CT abd/pelv--f/u pelvic u/s did not show anything (L ovary not visualized, likely secondary to bowel gas)-repeat pelvic u/s after 07/2014.  Marland Kitchen Anemia   . Asthma   . DDD (degenerative disc disease), cervical   . H/O angular cheilitis    Zinc normal, vit B12 low normal (2012)  . H/O vitamin D deficiency 2011   Came back to normal range with replacement therapydd  . History of adenomatous polyp of colon 2003, 2006, 2013;2016   Repeat 2013 showed tubular adenoma x 3, with no high grade dysplasia.  2016 no polyps-recall 5 yrs  . History of hiatal hernia   . Hyperlipidemia    per pt report 05/09/12; also on labs 06/2015.   Intolerant of pravachol 02/2016  . Hypothyroidism   . Kidney infection   . Lower leg pain    Bilateral, crampy--ABIs/dopplers NORMAL 04/2013.  . 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 PONV (postoperative nausea and vomiting)    "jerks" afterward  . Shortness of breath dyspnea     Past Surgical History:  Procedure Laterality Date  . BAND HEMORRHOIDECTOMY    . CHOLECYSTECTOMY  1980s  . COLONOSCOPY W/ BIOPSIES AND POLYPECTOMY  06/02/12;05/30/15   No polyps 2016-recall 5 yrs.  +internal hem.  Normal ileoscopy.  Marland Kitchen DILATATION & CURETTAGE/HYSTEROSCOPY WITH MYOSURE N/A 05/28/2016   Procedure: DILATATION & CURETTAGE/HYSTEROSCOPY WITH MYOSURE with ULTRASOUND guidance;  Surgeon: Salvadore Dom, MD;  Location: [facility];  Service: Gynecology;  Laterality: N/A;  Please have ultrasound in room. Follow Silva's first case.  Marland Kitchen GANGLION CYST EXCISION  2003   Right wrist, with excision of some triquetrum spurs  . stent placed right kidney    . THYROIDECTOMY, PARTIAL  1972  . TONSILLECTOMY  AND ADENOIDECTOMY  age 74  . TUBAL LIGATION    . WRIST SURGERY  2006   For left thumb carpo-metacarpal arthritis    Current Outpatient Medications  Medication Sig Dispense Refill  . acetaminophen (TYLENOL) 325 MG tablet Take 650 mg by mouth every 6 (six) hours as needed.    . cetirizine (ZYRTEC) 10 MG tablet Take 10 mg by mouth daily.    . Cholecalciferol (VITAMIN D3) 25 MCG (1000 UT) CAPS Take 1 capsule by mouth daily.    . diclofenac sodium (VOLTAREN) 1 % GEL Apply over affected area QID RPN 100 g 5  . fluticasone (FLONASE) 50 MCG/ACT nasal spray Place into the nose.    . levofloxacin (LEVAQUIN) 500 MG tablet Take 1 tablet (500 mg total) by mouth daily. 7 tablet 0  . levothyroxine (SYNTHROID, LEVOTHROID) 75 MCG tablet Take 1 tablet (75 mcg total) by mouth daily. 90 tablet 1  . PROAIR HFA 108 (90 Base) MCG/ACT inhaler Inhale 1-2 puffs into the lungs every 6 (six) hours as  needed. Reported on 05/15/2016     No current facility-administered medications for this visit.      ALLERGIES: Codeine, Darvocet [propoxyphene n-acetaminophen], Hydrocodone, and Valium [diazepam]  Family History  Problem Relation Age of Onset  . Cancer Mother        ovarian and throat cancer  . Diabetes Mother   . Alcohol abuse Father        alcoholism.  Died age 62  . Cancer Maternal Grandmother        breast cancer    Social History   Socioeconomic History  . Marital status: Married    Spouse name: Not on file  . Number of children: Not on file  . Years of education: Not on file  . Highest education level: Not on file  Occupational History  . Not on file  Social Needs  . Financial resource strain: Not on file  . Food insecurity    Worry: Not on file    Inability: Not on file  . Transportation needs    Medical: Not on file    Non-medical: Not on file  Tobacco Use  . Smoking status: Never Smoker  . Smokeless tobacco: Never Used  Substance and Sexual Activity  . Alcohol use: No    Alcohol/week: 0.0 standard drinks  . Drug use: No  . Sexual activity: Not Currently  Lifestyle  . Physical activity    Days per week: Not on file    Minutes per session: Not on file  . Stress: Not on file  Relationships  . Social Herbalist on phone: Not on file    Gets together: Not on file    Attends religious service: Not on file    Active member of club or organization: Not on file    Attends meetings of clubs or organizations: Not on file    Relationship status: Not on file  . Intimate partner violence    Fear of current or ex partner: Not on file    Emotionally abused: Not on file    Physically abused: Not on file    Forced sexual activity: Not on file  Other Topics Concern  . Not on file  Social History Narrative   Married, 3 children, 6 grandchildren, 1 GGc.   Orig from this region.  Drives a school bus (X 31 yrs).   No T/A/Ds.   Caffeine: one cup coffee a  day, 3 glasses of  sweet tea per day.   No formal exercise.  She is active (NOT sedentary).             Review of Systems  Constitutional: Negative.   HENT: Negative.   Eyes: Negative.   Respiratory: Negative.   Cardiovascular: Negative.   Gastrointestinal: Negative.   Genitourinary: Negative.   Musculoskeletal: Negative.   Skin: Negative.   Neurological: Negative.   Endo/Heme/Allergies: Negative.   Psychiatric/Behavioral: Negative.     PHYSICAL EXAMINATION:    BP 128/62 (BP Location: Right Arm, Patient Position: Sitting, Cuff Size: Normal)   Pulse 64   Temp 98.4 F (36.9 C) (Skin)   Wt 113 lb 9.6 oz (51.5 kg)   BMI 20.78 kg/m     General appearance: alert, cooperative and appears stated age  Reviewed ultrasound images with the patient  ASSESSMENT Stable 3cm simple appearing left adnexal cyst, previously normal CA 125. Stable for at least 3 years (probably 6 years)    PLAN Will stop routine imaging F/U next year for annual exam/pelvic She will call with any concerns.    An After Visit Summary was printed and given to the patient.

## 2019-04-16 ENCOUNTER — Other Ambulatory Visit: Payer: Self-pay

## 2019-04-16 ENCOUNTER — Ambulatory Visit (INDEPENDENT_AMBULATORY_CARE_PROVIDER_SITE_OTHER): Payer: Medicare Other | Admitting: Obstetrics and Gynecology

## 2019-04-16 ENCOUNTER — Ambulatory Visit (INDEPENDENT_AMBULATORY_CARE_PROVIDER_SITE_OTHER): Payer: Medicare Other

## 2019-04-16 ENCOUNTER — Encounter: Payer: Self-pay | Admitting: Obstetrics and Gynecology

## 2019-04-16 VITALS — BP 128/62 | HR 64 | Temp 98.4°F | Wt 113.6 lb

## 2019-04-16 DIAGNOSIS — N949 Unspecified condition associated with female genital organs and menstrual cycle: Secondary | ICD-10-CM

## 2019-04-16 DIAGNOSIS — N83202 Unspecified ovarian cyst, left side: Secondary | ICD-10-CM | POA: Diagnosis not present

## 2019-04-17 ENCOUNTER — Encounter: Payer: Self-pay | Admitting: Family Medicine

## 2019-04-17 DIAGNOSIS — N83209 Unspecified ovarian cyst, unspecified side: Secondary | ICD-10-CM | POA: Insufficient documentation

## 2019-05-07 IMAGING — MG DIGITAL SCREENING BILATERAL MAMMOGRAM WITH TOMO AND CAD
8 series · 9 of 24 positions shown · non-contrast
Comparison: Previous exam(s).

CLINICAL DATA: Screening.

EXAM:
DIGITAL SCREENING BILATERAL MAMMOGRAM WITH TOMO AND CAD

[L CC synth-2D]
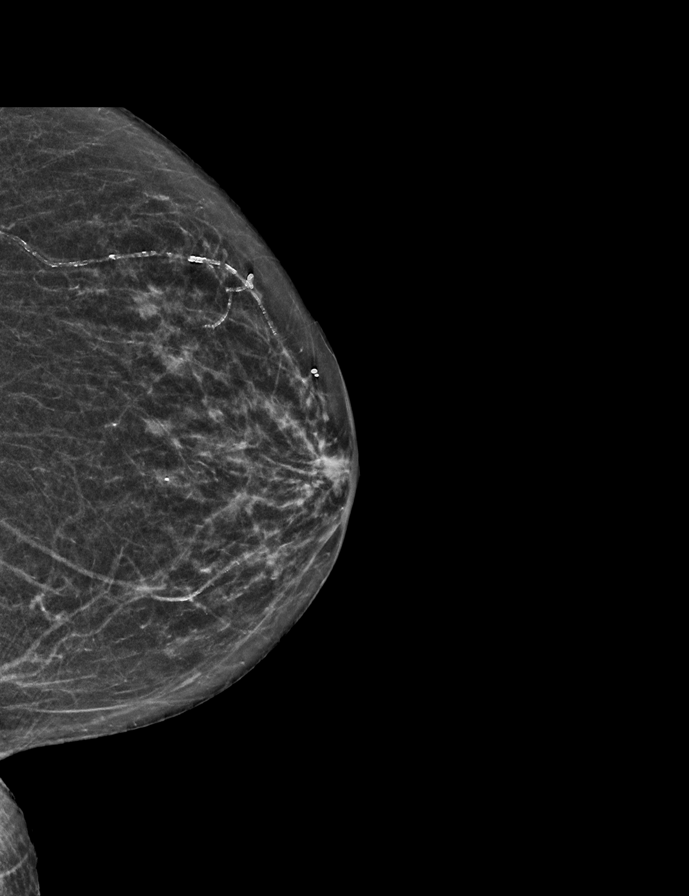

[L MLO synth-2D]
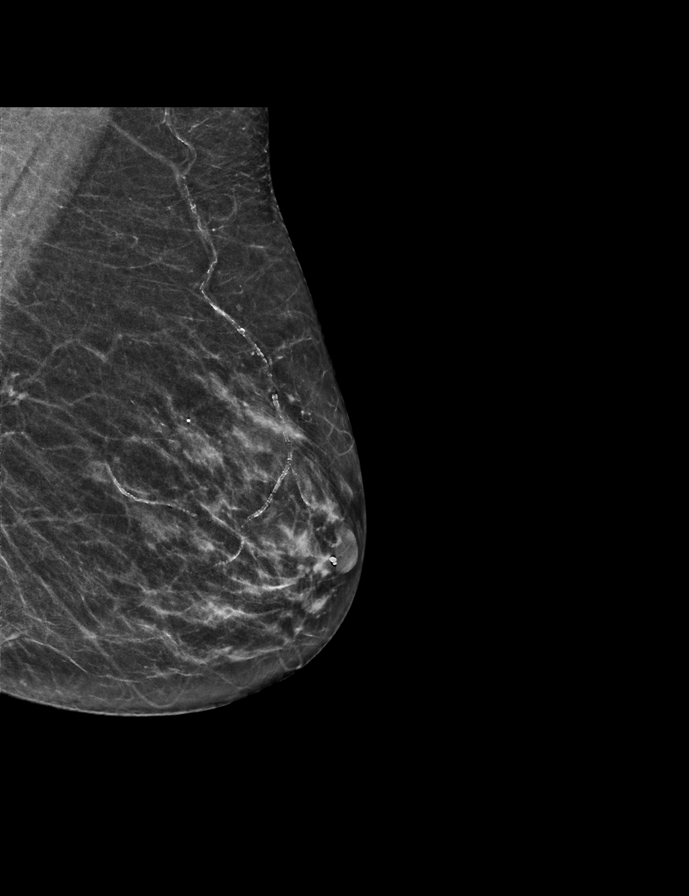

[R MLO synth-2D]
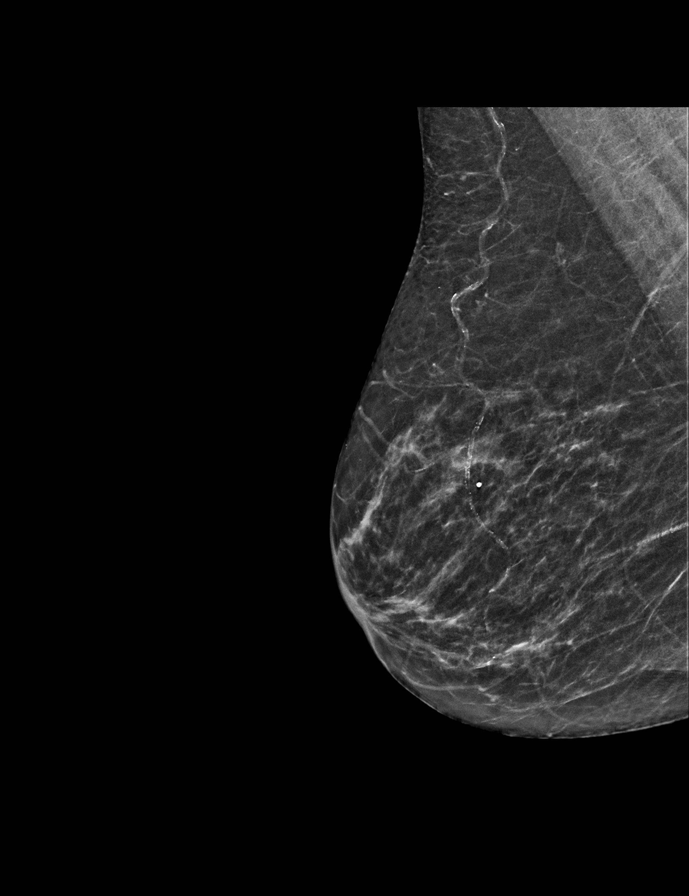

[R CC synth-2D]
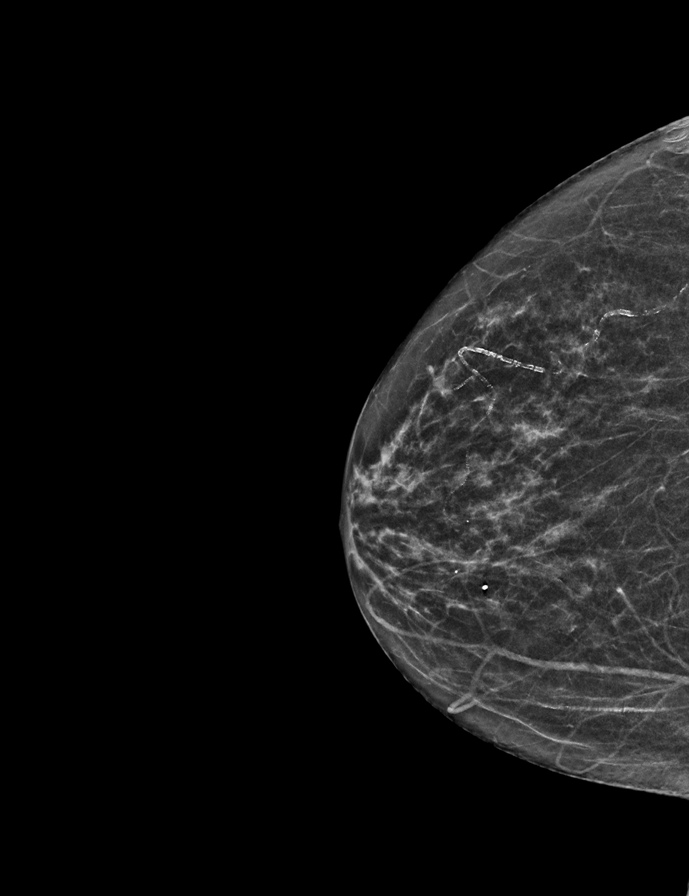

[L MLO tomo · 2 of 55 frames shown]
[frame 18/55]
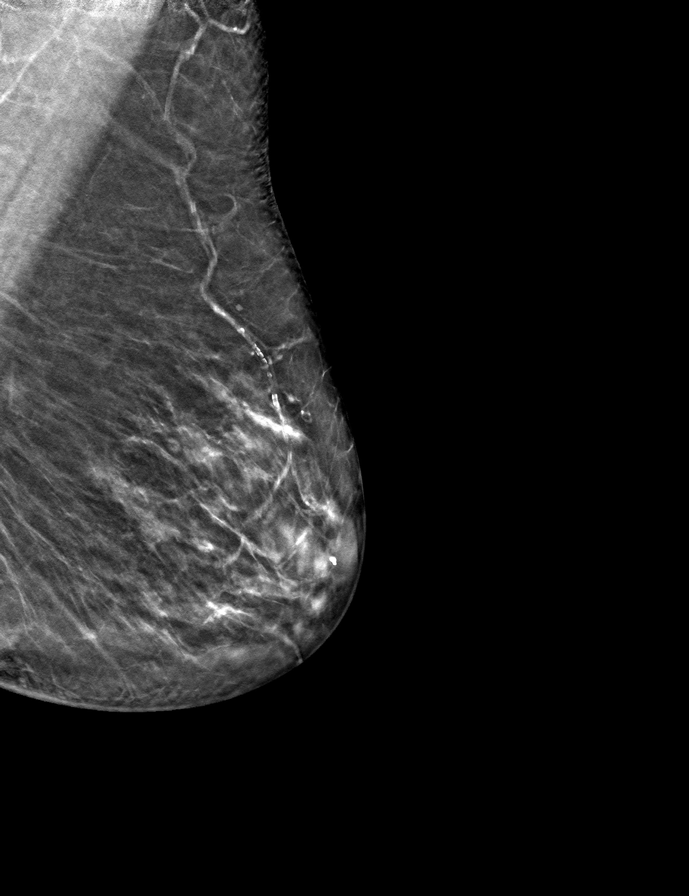
[frame 28/55]
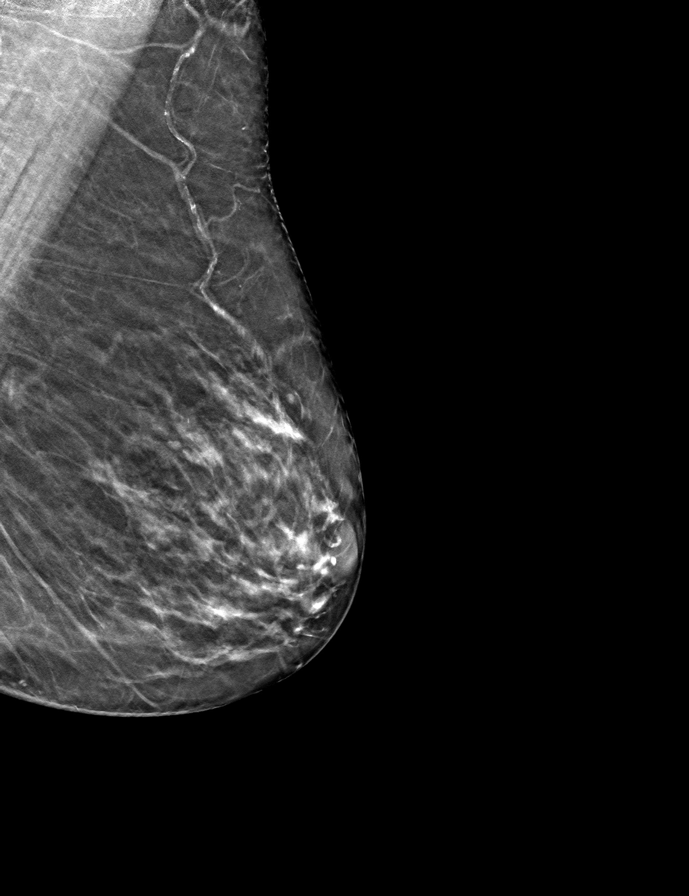

[R MLO tomo · tomo slice 27/53.0]
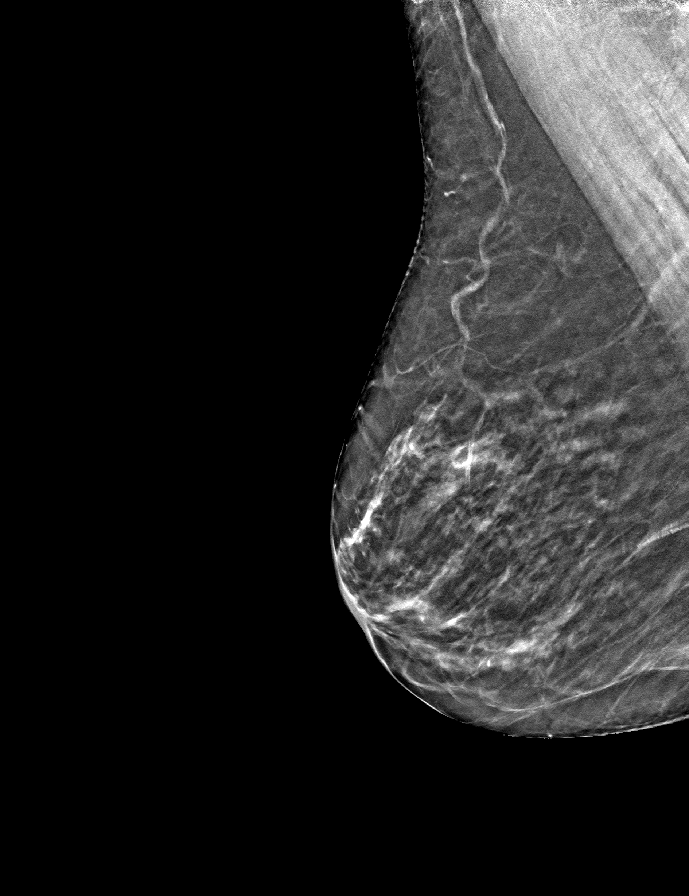

[L CC tomo · tomo slice 23/46.0]
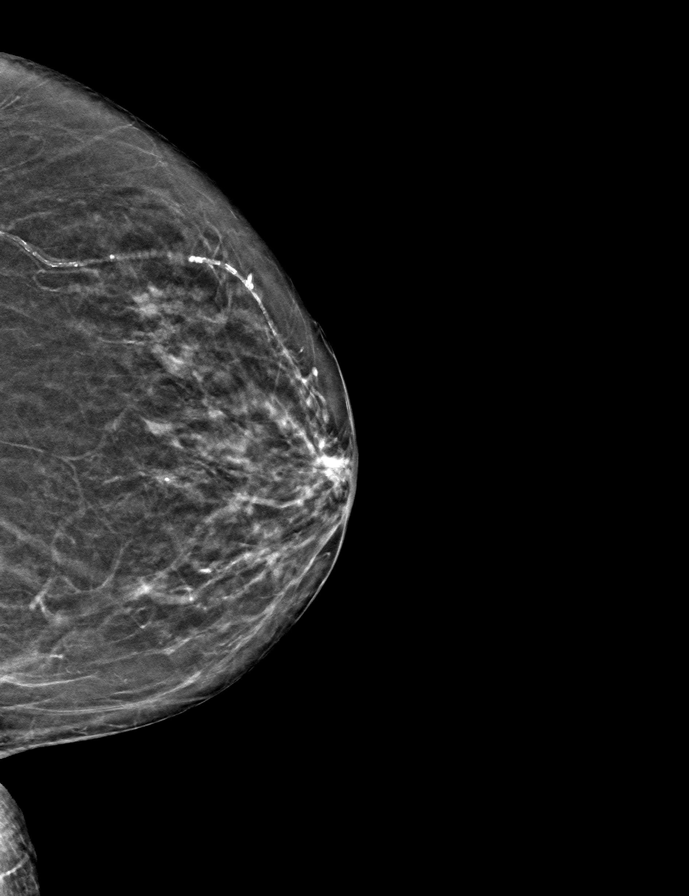

[R CC tomo · tomo slice 25/48.0]
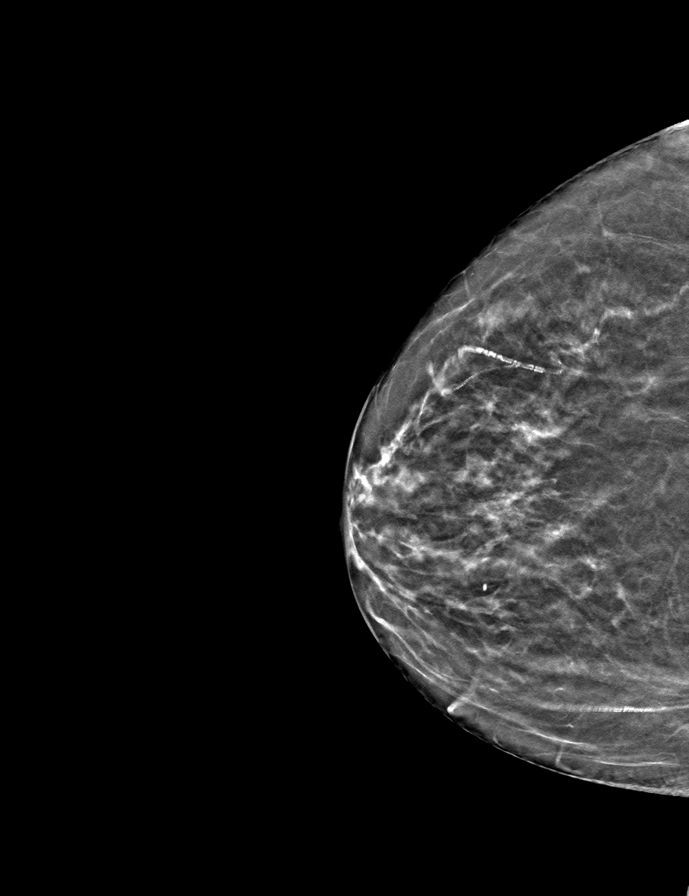

[9 of 24 positions shown; findings below may reference images not displayed]

ACR Breast Density Category b: There are scattered areas of
fibroglandular density.
FINDINGS: There are no findings suspicious for malignancy. Images were
processed with CAD.
IMPRESSION: No mammographic evidence of malignancy. A result letter of this
screening mammogram will be mailed directly to the patient.

RECOMMENDATION:
Screening mammogram in one year. (Code:[id number])

BI-RADS CATEGORY  1: Negative.

## 2019-07-13 ENCOUNTER — Other Ambulatory Visit: Payer: Self-pay | Admitting: Family Medicine

## 2019-07-13 NOTE — Telephone Encounter (Signed)
Called patient and detailed msg to call our office and make a f/u appointment since she has not been seen in our office for this [facility] in awhile. I sent in a 30 supply to last her until she can be seen.

## 2019-07-24 ENCOUNTER — Encounter: Payer: Self-pay | Admitting: Family Medicine

## 2019-07-24 ENCOUNTER — Ambulatory Visit (INDEPENDENT_AMBULATORY_CARE_PROVIDER_SITE_OTHER): Payer: Medicare Other | Admitting: Family Medicine

## 2019-07-24 ENCOUNTER — Other Ambulatory Visit: Payer: Self-pay

## 2019-07-24 VITALS — BP 106/61 | Temp 98.1°F | Resp 18 | Ht 62.0 in | Wt 114.5 lb

## 2019-07-24 DIAGNOSIS — Z23 Encounter for immunization: Secondary | ICD-10-CM

## 2019-07-24 DIAGNOSIS — E7849 Other hyperlipidemia: Secondary | ICD-10-CM

## 2019-07-24 DIAGNOSIS — E559 Vitamin D deficiency, unspecified: Secondary | ICD-10-CM | POA: Diagnosis not present

## 2019-07-24 DIAGNOSIS — E039 Hypothyroidism, unspecified: Secondary | ICD-10-CM | POA: Diagnosis not present

## 2019-07-24 NOTE — Progress Notes (Signed)
Patient ID: Margaret Holmes, female  DOB: 1940-12-11, 78 y.o.   MRN: [id number] Patient Care Team    Relationship Specialty Notifications Start End  [name], DO PCP - General Family Medicine  04/09/16   Richmond Campbell, MD Consulting Physician Gastroenterology  05/23/16   Salvadore Dom, MD Consulting Physician Obstetrics and Gynecology  05/23/16   Leroy Sea, MD Referring Physician Urology  08/27/16   [facility]  09/01/18     Subjective:  Margaret Holmes is a 78 y.o.  Female  present for  hyperlipidemia Patient is currently diet controlled.  Hypothyroidism, unspecified type Patient reports compliance with levothyroxine 75 mcg daily.    Vitamin D deficiency Patient with history of vitamin D deficiency.  She is currently taking her multivitamin only.  Depression screen [facility] 2/9 09/01/2018 09/01/2018 08/30/2017 08/27/2016 02/07/2016  Decreased Interest 0 0 0 0 0  Down, Depressed, Hopeless 0 0 0 0 0  PHQ - 2 Score 0 0 0 0 0    Immunization History  Administered Date(s) Administered  . Influenza, High Dose Seasonal PF 07/24/2016, 08/08/2017, 08/14/2018  . Influenza,inj,Quad PF,6+ Mos 08/01/2015  . Pneumococcal Conjugate-13 07/28/2015  . Pneumococcal Polysaccharide-23 08/27/2016  . Td 05/07/2016  . Zoster Recombinat (Shingrix) 06/21/2018, 08/25/2018    Past Medical History:  Diagnosis Date  . Adnexal cyst 07/2013   Left-3cm-noted on CT abd/pelv--f/u pelvic u/s did not show anything (L ovary not visualized, likely secondary to bowel gas)-repeat pelvic u/s after 07/2014.  Marland Kitchen Anemia   . Asthma   . DDD (degenerative disc disease), cervical   . H/O angular cheilitis    Zinc normal, vit B12 low normal (2012)  . H/O vitamin D deficiency 2011   Came back to normal range with replacement therapydd  . History of adenomatous polyp of colon 2003, 2006, 2013;2016   Repeat 2013 showed tubular adenoma x 3, with no high grade  dysplasia.  2016 no polyps-recall 5 yrs  . History of hiatal hernia   . Hyperlipidemia    per pt report 05/09/12; also on labs 06/2015.  Intolerant of pravachol 02/2016  . Hypothyroidism   . Kidney infection   . Lower leg pain    Bilateral, crampy--ABIs/dopplers NORMAL 04/2013.  . 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 PONV (postoperative nausea and vomiting)    "jerks" afterward  . Shortness of breath dyspnea    Allergies  Allergen Reactions  . Codeine Other (See Comments)    Not specified in old records.  Carlton Adam [Propoxyphene N-Acetaminophen] Other (See Comments)    Not specified in old records  . Hydrocodone Other (See Comments)    Not specified in old records  . Valium [Diazepam]     Psychiatric reaction   Past Surgical History:  Procedure Laterality Date  . BAND HEMORRHOIDECTOMY    . CHOLECYSTECTOMY  1980s  . COLONOSCOPY W/ BIOPSIES AND POLYPECTOMY  06/02/12;05/30/15   No polyps 2016-recall 5 yrs.  +internal hem.  Normal ileoscopy.  Marland Kitchen DILATATION & CURETTAGE/HYSTEROSCOPY WITH MYOSURE N/A 05/28/2016   Procedure: DILATATION & CURETTAGE/HYSTEROSCOPY WITH MYOSURE with ULTRASOUND guidance;  Surgeon: Salvadore Dom, MD;  Location: [facility];  Service: Gynecology;  Laterality: N/A;  Please have ultrasound in room. Follow Silva's first case.  Marland Kitchen GANGLION CYST EXCISION  2003   Right wrist, with excision of some triquetrum spurs  . stent placed right kidney    .  THYROIDECTOMY, PARTIAL  1972  . TONSILLECTOMY AND ADENOIDECTOMY  age 71  . TUBAL LIGATION    . WRIST SURGERY  2006   For left thumb carpo-metacarpal arthritis   Family History  Problem Relation Age of Onset  . Cancer Mother        ovarian and throat cancer  . Diabetes Mother   . Alcohol abuse Father        alcoholism.  Died age 29  . Cancer Maternal Grandmother        breast cancer   Social History   Socioeconomic History  . Marital status: Married    Spouse name: Not on  file  . Number of children: Not on file  . Years of education: Not on file  . Highest education level: Not on file  Occupational History  . Not on file  Social Needs  . Financial resource strain: Not on file  . Food insecurity    Worry: Not on file    Inability: Not on file  . Transportation needs    Medical: Not on file    Non-medical: Not on file  Tobacco Use  . Smoking status: Never Smoker  . Smokeless tobacco: Never Used  Substance and Sexual Activity  . Alcohol use: No    Alcohol/week: 0.0 standard drinks  . Drug use: No  . Sexual activity: Not Currently  Lifestyle  . Physical activity    Days per week: Not on file    Minutes per session: Not on file  . Stress: Not on file  Relationships  . Social Herbalist on phone: Not on file    Gets together: Not on file    Attends religious service: Not on file    Active member of club or organization: Not on file    Attends meetings of clubs or organizations: Not on file    Relationship status: Not on file  . Intimate partner violence    Fear of current or ex partner: Not on file    Emotionally abused: Not on file    Physically abused: Not on file    Forced sexual activity: Not on file  Other Topics Concern  . Not on file  Social History Narrative   Married, 3 children, 6 grandchildren, 1 GGc.   Orig from this region.  Drives a school bus (X 31 yrs).   No T/A/Ds.   Caffeine: one cup coffee a day, 3 glasses of sweet tea per day.   No formal exercise.  She is active (NOT sedentary).            Allergies as of 07/24/2019      Reactions   Codeine Other (See Comments)   Not specified in old records.   Darvocet [propoxyphene N-acetaminophen] Other (See Comments)   Not specified in old records   Hydrocodone Other (See Comments)   Not specified in old records   Valium [diazepam]    Psychiatric reaction      Medication List       Accurate as of July 24, 2019 11:59 PM. If you have any questions, ask  your nurse or doctor.        STOP taking these medications   levofloxacin 500 MG tablet Commonly known as: LEVAQUIN Stopped by: Howard Pouch, DO     TAKE these medications   acetaminophen 325 MG tablet Commonly known as: TYLENOL Take 650 mg by mouth every 6 (six) hours as needed.   cetirizine 10 MG  tablet Commonly known as: ZYRTEC Take 10 mg by mouth daily.   diclofenac sodium 1 % Gel Commonly known as: VOLTAREN Apply over affected area QID RPN   fluticasone 50 MCG/ACT nasal spray Commonly known as: FLONASE Place into the nose.   ProAir HFA 108 (90 Base) MCG/ACT inhaler Generic drug: albuterol Inhale 1-2 puffs into the lungs every 6 (six) hours as needed. Reported on 05/15/2016   Synthroid 75 MCG tablet Generic drug: levothyroxine TAKE 1 TABLET DAILY   Vitamin D3 25 MCG (1000 UT) Caps Take 1 capsule by mouth daily.        Recent Results (from the past 2160 hour(s))  Comp Met (CMET)     Status: None   Collection Time: 07/24/19  4:00 PM  Result Value Ref Range   Glucose, Bld 87 65 - 99 mg/dL    Comment: .            Fasting reference interval .    BUN 9 7 - 25 mg/dL   Creat 0.90 0.60 - 0.93 mg/dL    Comment: For patients >5 years of age, the reference limit for Creatinine is approximately 13% higher for people identified as African-American. .    BUN/Creatinine Ratio NOT APPLICABLE 6 - 22 (calc)   Sodium 141 135 - 146 mmol/L   Potassium 3.8 3.5 - 5.3 mmol/L   Chloride 104 98 - 110 mmol/L   CO2 27 20 - 32 mmol/L   Calcium 9.0 8.6 - 10.4 mg/dL   Total Protein 6.2 6.1 - 8.1 g/dL   Albumin 4.0 3.6 - 5.1 g/dL   Globulin 2.2 1.9 - 3.7 g/dL (calc)   AG Ratio 1.8 1.0 - 2.5 (calc)   Total Bilirubin 0.6 0.2 - 1.2 mg/dL   Alkaline phosphatase (APISO) 47 37 - 153 U/L   AST 23 10 - 35 U/L   ALT 13 6 - 29 U/L  Lipid panel     Status: Abnormal   Collection Time: 07/24/19  4:00 PM  Result Value Ref Range   Cholesterol 180 <200 mg/dL   HDL 50 > OR = 50 mg/dL    Triglycerides 83 <150 mg/dL   LDL Cholesterol (Calc) 112 (H) mg/dL (calc)    Comment: Reference range: <100 . Desirable range <100 mg/dL for primary prevention;   <70 mg/dL for patients with CHD or diabetic patients  with > or = 2 CHD risk factors. Marland Kitchen LDL-C is now calculated using the Martin-Hopkins  calculation, which is a validated novel method providing  better accuracy than the Friedewald equation in the  estimation of LDL-C.  Cresenciano Genre et al. Annamaria Helling. 9833;825(05): 2061-2068  (http://education.[facility].com/faq/FAQ164)    Total CHOL/HDL Ratio 3.6 <5.0 (calc)   Non-HDL Cholesterol (Calc) 130 (H) <130 mg/dL (calc)    Comment: For patients with diabetes plus 1 major ASCVD risk  factor, treating to a non-HDL-C goal of <100 mg/dL  (LDL-C of <70 mg/dL) is considered a therapeutic  option.   TSH     Status: None   Collection Time: 07/24/19  4:00 PM  Result Value Ref Range   TSH 0.59 0.40 - 4.50 mIU/L  Vitamin D (25 hydroxy)     Status: None   Collection Time: 07/24/19  4:00 PM  Result Value Ref Range   Vit D, 25-Hydroxy 39 30 - 100 ng/mL    Comment: Vitamin D Status         25-OH Vitamin D: . Deficiency:                    <  20 ng/mL Insufficiency:             20 - 29 ng/mL Optimal:                 > or = 30 ng/mL . For 25-OH Vitamin D testing on patients on  D2-supplementation and patients for whom quantitation  of D2 and D3 fractions is required, the [facility]ureD(TM) 25-OH VIT D, (D2,D3), LC/MS/MS is recommended: order  code [id number] (patients >16yr). See Note 1 . Note 1 . For additional information, please refer to  http://education.[facility].com/faq/FAQ199  (This link is being provided for informational/ educational purposes only.)     No results found.   ROS: 14 pt review of systems performed and negative (unless mentioned in an HPI)  Objective: BP 106/61 (BP Location: Right Arm, Patient Position: Sitting, Cuff Size: Normal)   Temp 98.1 F (36.7  C) (Temporal)   Resp 18   Ht '5\' 2"'  (1.575 m)   Wt 114 lb 8 oz (51.9 kg)   SpO2 99%   BMI 20.94 kg/m  Gen: Afebrile. No acute distress.  Nontoxic, pleasant thin Caucasian female. HENT: AT. [place].  Eyes:Pupils Equal Round Reactive to light, Extraocular movements intact,  Conjunctiva without redness, discharge or icterus. Neck/lymp/endocrine: Supple, no lymphadenopathy, no thyromegaly CV: RRR no murmur, no edema, +2/4 P posterior tibialis pulses Chest: CTAB, no wheeze or crackles Abd: Soft.  Flat. NTND. BS present.  Skin: No rashes, purpura or petechiae.  Neuro:  Normal gait. PERLA. EOMi. Alert. Oriented x3  Psych: Normal affect, dress and demeanor. Normal speech. Normal thought content and judgment  Assessment/plan: LSEVERINA SYKORAis a 78y.o. female present for  hyperlipidemia - stable.  - lipid panel collected today  Hypothyroidism, unspecified type -Stable on levothyroxine 75 mcg daily -TSH -Refills will be provided once lab results are received.  Vitamin D deficiency -Still taking multivitamin only - Vitamin D (25 hydroxy)  Need for influenza vaccination - Flu Vaccine QUAD High Dose(Fluad)  Follow-up yearly on chronic conditions with provider and also yearly with health coach for Medicare wellness.  > 25 minutes spent with patient, >50% of time spent face to face   Orders Placed This Encounter  Procedures  . Flu Vaccine QUAD High Dose(Fluad)  . Comp Met (CMET)  . Lipid panel  . TSH  . Vitamin D (25 hydroxy)     Electronically signed by: RHoward Pouch [place]

## 2019-07-24 NOTE — Patient Instructions (Addendum)
Great to see you today.  I will refill your thyroid med after I get the results to make sure we dont need to change dose.   We will call when we get the labs back.   Stay safe!

## 2019-07-25 LAB — COMPREHENSIVE METABOLIC PANEL
AG Ratio: 1.8 (calc) (ref 1.0–2.5)
ALT: 13 U/L (ref 6–29)
AST: 23 U/L (ref 10–35)
Albumin: 4 g/dL (ref 3.6–5.1)
Alkaline phosphatase (APISO): 47 U/L (ref 37–153)
BUN: 9 mg/dL (ref 7–25)
CO2: 27 mmol/L (ref 20–32)
Calcium: 9 mg/dL (ref 8.6–10.4)
Chloride: 104 mmol/L (ref 98–110)
Creat: 0.9 mg/dL (ref 0.60–0.93)
Globulin: 2.2 g/dL (calc) (ref 1.9–3.7)
Glucose, Bld: 87 mg/dL (ref 65–99)
Potassium: 3.8 mmol/L (ref 3.5–5.3)
Sodium: 141 mmol/L (ref 135–146)
Total Bilirubin: 0.6 mg/dL (ref 0.2–1.2)
Total Protein: 6.2 g/dL (ref 6.1–8.1)

## 2019-07-25 LAB — LIPID PANEL
Cholesterol: 180 mg/dL (ref <200)
HDL: 50 mg/dL (ref >=50)
LDL Cholesterol (Calc): 112 mg/dL (calc) — ABNORMAL HIGH
Non-HDL Cholesterol (Calc): 130 mg/dL (calc) — ABNORMAL HIGH (ref <130)
Total CHOL/HDL Ratio: 3.6 (calc) (ref <5.0)
Triglycerides: 83 mg/dL (ref <150)

## 2019-07-25 LAB — VITAMIN D 25 HYDROXY (VIT D DEFICIENCY, FRACTURES): Vit D, 25-Hydroxy: 39 ng/mL (ref 30–100)

## 2019-07-25 LAB — TSH: TSH: 0.59 mIU/L (ref 0.40–4.50)

## 2019-07-26 ENCOUNTER — Encounter: Payer: Self-pay | Admitting: Family Medicine

## 2019-07-26 ENCOUNTER — Telehealth: Payer: Self-pay | Admitting: Family Medicine

## 2019-07-26 MED ORDER — LEVOTHYROXINE SODIUM 75 MCG PO TABS: 75.0000 ug | ORAL_TABLET | Freq: Every day | ORAL | 3 refills | Status: DC

## 2019-07-26 NOTE — Telephone Encounter (Signed)
Please inform patient the following information: Her labs are all normal and look great. I have refilled her thyroid medication for her.  F/u 1 year.

## 2019-07-27 NOTE — Telephone Encounter (Signed)
Tried to contact patient, no voice mail set up.  Will need to try again later.

## 2019-07-29 DIAGNOSIS — Z23 Encounter for immunization: Secondary | ICD-10-CM | POA: Diagnosis not present

## 2019-07-29 NOTE — Telephone Encounter (Signed)
Patient aware.

## 2019-08-28 DIAGNOSIS — H43813 Vitreous degeneration, bilateral: Secondary | ICD-10-CM | POA: Diagnosis not present

## 2019-08-28 DIAGNOSIS — H2513 Age-related nuclear cataract, bilateral: Secondary | ICD-10-CM | POA: Diagnosis not present

## 2019-08-28 DIAGNOSIS — H43393 Other vitreous opacities, bilateral: Secondary | ICD-10-CM | POA: Diagnosis not present

## 2019-08-28 DIAGNOSIS — H353131 Nonexudative age-related macular degeneration, bilateral, early dry stage: Secondary | ICD-10-CM | POA: Diagnosis not present

## 2019-09-11 ENCOUNTER — Ambulatory Visit (INDEPENDENT_AMBULATORY_CARE_PROVIDER_SITE_OTHER): Payer: Medicare Other | Admitting: Family Medicine

## 2019-09-11 ENCOUNTER — Encounter: Payer: Self-pay | Admitting: Family Medicine

## 2019-09-11 ENCOUNTER — Other Ambulatory Visit: Payer: Self-pay

## 2019-09-11 VITALS — Wt 110.0 lb

## 2019-09-11 DIAGNOSIS — R05 Cough: Secondary | ICD-10-CM | POA: Diagnosis not present

## 2019-09-11 DIAGNOSIS — R0982 Postnasal drip: Secondary | ICD-10-CM

## 2019-09-11 DIAGNOSIS — J069 Acute upper respiratory infection, unspecified: Secondary | ICD-10-CM

## 2019-09-11 DIAGNOSIS — R059 Cough, unspecified: Secondary | ICD-10-CM

## 2019-09-11 MED ORDER — BENZONATATE 200 MG PO CAPS: 200.0000 mg | ORAL_CAPSULE | Freq: Two times a day (BID) | ORAL | 0 refills | Status: DC | PRN

## 2019-09-11 NOTE — Progress Notes (Signed)
Virtual Visit via Video Note  I connected with pt on 09/11/19 at  4:00 PM EST by a telephone (a video enabled telemedicine application was not available to pt) and verified that I am speaking with the correct person using two identifiers.  Location patient: home Location provider:work or home office Persons participating in the virtual visit: patient, provider  I discussed the limitations of evaluation and management by telephone and the availability of in person appointments. The patient expressed understanding and agreed to proceed.  Telephone visit is a necessity given the COVID-19 restrictions in place at the current time.  HPI: 78 y/o Holmes with whom I am doing a telephone visit today (due to COVID-19 pandemic restrictions) for respiratory complaints. Onset 3 days ago, nasal congestion and PND cough.  Mild ST at onset but this is gone. HA. No fevers.  No body aches.  No n/v/d.  PO intake is fine.  No SOB.  No wheezing.  Some discomfort in central chest with coughing--started today. Started using tessalon last night.  Taking cold/flu/ST formula some recently. She does not smoke.  ROS: See pertinent positives and negatives per HPI.  Past Medical History:  Diagnosis Date  . Adnexal cyst 07/2013   Left-3cm-noted on CT abd/pelv--f/u pelvic u/s did not show anything (L ovary not visualized, likely secondary to bowel gas)-repeat pelvic u/s after 07/2014.  Marland Kitchen Anemia   . Asthma   . DDD (degenerative disc disease), cervical   . H/O angular cheilitis    Zinc normal, vit B12 low normal (2012)  . H/O vitamin D deficiency 2011   Came back to normal range with replacement therapydd  . History of adenomatous polyp of colon 2003, 2006, 2013;2016   Repeat 2013 showed tubular adenoma x 3, with no high grade dysplasia.  2016 no polyps-recall 5 yrs  . History of hiatal hernia   . Hyperlipidemia    per pt report Margaret/12/13; also on labs 06/2015.  Intolerant of pravachol 02/2016  . Hypothyroidism   .  Kidney infection   . Lower leg pain    Bilateral, crampy--ABIs/dopplers NORMAL Margaret/2014.  . 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 PONV (postoperative nausea and vomiting)    "jerks" afterward  . Shortness of breath dyspnea     Past Surgical History:  Procedure Laterality Date  . BAND HEMORRHOIDECTOMY    . CHOLECYSTECTOMY  1980s  . COLONOSCOPY W/ BIOPSIES AND POLYPECTOMY  06/02/12;05/30/15   No polyps 2016-recall 5 yrs.  +internal hem.  Normal ileoscopy.  Marland Kitchen DILATATION & CURETTAGE/HYSTEROSCOPY WITH MYOSURE N/A Margaret/31/2017   Procedure: DILATATION & CURETTAGE/HYSTEROSCOPY WITH MYOSURE with ULTRASOUND guidance;  Surgeon: Salvadore Dom, MD;  Location: [facility];  Service: Gynecology;  Laterality: N/A;  Please have ultrasound in room. Follow Silva's first case.  Marland Kitchen GANGLION CYST EXCISION  2003   Right wrist, with excision of some triquetrum spurs  . stent placed right kidney    . THYROIDECTOMY, PARTIAL  1972  . TONSILLECTOMY AND ADENOIDECTOMY  age 81  . TUBAL LIGATION    . WRIST SURGERY  2006   For left thumb carpo-metacarpal arthritis    Family History  Problem Relation Age of Onset  . Cancer Mother        ovarian and throat cancer  . Diabetes Mother   . Alcohol abuse Father        alcoholism.  Died age 22  . Cancer Maternal Grandmother  breast cancer    SOCIAL HX:  Social History   Socioeconomic History  . Marital status: Married    Spouse name: Not on file  . Number of children: Not on file  . Years of education: Not on file  . Highest education level: Not on file  Occupational History  . Not on file  Social Needs  . Financial resource strain: Not on file  . Food insecurity    Worry: Not on file    Inability: Not on file  . Transportation needs    Medical: Not on file    Non-medical: Not on file  Tobacco Use  . Smoking status: Never Smoker  . Smokeless tobacco: Never Used  Substance and Sexual Activity  . Alcohol use:  No    Alcohol/week: 0.0 standard drinks  . Drug use: No  . Sexual activity: Not Currently  Lifestyle  . Physical activity    Days per week: Not on file    Minutes per session: Not on file  . Stress: Not on file  Relationships  . Social Herbalist on phone: Not on file    Gets together: Not on file    Attends religious service: Not on file    Active member of club or organization: Not on file    Attends meetings of clubs or organizations: Not on file    Relationship status: Not on file  Other Topics Concern  . Not on file  Social History Narrative   Married, 3 children, 6 grandchildren, 1 GGc.   Orig from this region.  Drives a school bus (X 31 yrs).   No T/A/Ds.   Caffeine: one cup coffee a day, 3 glasses of sweet tea per day.   No formal exercise.  She is active (NOT sedentary).              Current Outpatient Medications:  .  acetaminophen (TYLENOL) 325 MG tablet, Take 650 mg by mouth every 6 (six) hours as needed., Disp: , Rfl:  .  cetirizine (ZYRTEC) 10 MG tablet, Take 10 mg by mouth daily., Disp: , Rfl:  .  Cholecalciferol (VITAMIN D3) 25 MCG (1000 UT) CAPS, Take 1 capsule by mouth daily., Disp: , Rfl:  .  diclofenac sodium (VOLTAREN) 1 % GEL, Apply over affected area QID RPN, Disp: 100 g, Rfl: 5 .  fluticasone (FLONASE) 50 MCG/ACT nasal spray, Place into the nose., Disp: , Rfl:  .  levothyroxine (SYNTHROID) 75 MCG tablet, Take 1 tablet (75 mcg total) by mouth daily., Disp: 90 tablet, Rfl: 3 .  PROAIR HFA 108 (90 Base) MCG/ACT inhaler, Inhale 1-2 puffs into the lungs every 6 (six) hours as needed. Reported on Margaret/18/2017, Disp: , Rfl:   EXAM:  VITALS per patient if applicable: Wt 110 lb (49.9 kg)   BMI 20.12 kg/m    GENERAL: alert, oriented, sounds well and in no acute distress No further exam b/c audio visit only.  LABS: none today  Lab Results  Component Value Date   TSH 0.59 07/24/2019    ASSESSMENT AND PLAN:  Discussed the following  assessment and plan:  Viral URI with cough. Tessalon pearls 200mg  bid prn, #20. Saline nasal spray and otc nonsedating antihistamin/sudafed combo prn. Signs/symptoms to call or return for were reviewed and pt expressed understanding. Discussed quarantine recommendations due to possibility of any resp infection at this time being covid 19.  I discussed the assessment and treatment plan with the patient. The patient was  provided an opportunity to ask questions and all were answered. The patient agreed with the plan and demonstrated an understanding of the instructions.   The patient was advised to call back or seek an in-person evaluation if the symptoms worsen or if the condition fails to improve as anticipated.  Spent 10 min with pt today, with >50% of this time spent in counseling and care coordination regarding the above problems.  F/u: if not improving in 5-7d  Signed:  Crissie Sickles, MD           09/11/2019

## 2019-09-14 ENCOUNTER — Other Ambulatory Visit: Payer: Self-pay

## 2019-09-14 DIAGNOSIS — Z20822 Contact with and (suspected) exposure to covid-19: Secondary | ICD-10-CM

## 2019-09-14 DIAGNOSIS — Z888 Allergy status to other drugs, medicaments and biological substances status: Secondary | ICD-10-CM | POA: Diagnosis not present

## 2019-09-14 DIAGNOSIS — R509 Fever, unspecified: Secondary | ICD-10-CM | POA: Diagnosis not present

## 2019-09-14 DIAGNOSIS — Z20828 Contact with and (suspected) exposure to other viral communicable diseases: Secondary | ICD-10-CM | POA: Diagnosis not present

## 2019-09-14 DIAGNOSIS — N390 Urinary tract infection, site not specified: Secondary | ICD-10-CM | POA: Diagnosis not present

## 2019-09-14 DIAGNOSIS — Z79899 Other long term (current) drug therapy: Secondary | ICD-10-CM | POA: Diagnosis not present

## 2019-09-16 ENCOUNTER — Telehealth: Payer: Self-pay | Admitting: Family Medicine

## 2019-09-16 LAB — NOVEL CORONAVIRUS, NAA: SARS-CoV-2, NAA: NOT DETECTED

## 2019-09-16 NOTE — Telephone Encounter (Signed)
If negative for covid- she can have an in person visit.

## 2019-09-16 NOTE — Telephone Encounter (Signed)
Test Neg for COVID on 09/14/2019. Please advise if okay to schedule patient on 11/23

## 2019-09-16 NOTE — Telephone Encounter (Signed)
Pt was called and scheduled for Friday 09/18/2019. She will call if she feels worse and to go to ED if she has SOB, she verbalized understanding

## 2019-09-16 NOTE — Telephone Encounter (Signed)
Pt called in stating she went to ER Monday at [facility] as advised by Dr. Anitra Lauth. She tested negative for COVID-19 and was told she has a bad UTI. Pt said fever is now gone. Pt has a cough but no other symptoms. She is requesting in office appt Monday 11/23 with Dr. Raoul Pitch. Please advise if ok for in office.

## 2019-09-17 ENCOUNTER — Other Ambulatory Visit: Payer: Self-pay

## 2019-09-18 ENCOUNTER — Ambulatory Visit (INDEPENDENT_AMBULATORY_CARE_PROVIDER_SITE_OTHER): Payer: Medicare Other | Admitting: Family Medicine

## 2019-09-18 ENCOUNTER — Encounter: Payer: Self-pay | Admitting: Family Medicine

## 2019-09-18 VITALS — BP 129/77 | HR 66 | Temp 98.3°F | Resp 18 | Ht 62.0 in | Wt 112.0 lb

## 2019-09-18 DIAGNOSIS — N390 Urinary tract infection, site not specified: Secondary | ICD-10-CM

## 2019-09-18 DIAGNOSIS — E871 Hypo-osmolality and hyponatremia: Secondary | ICD-10-CM

## 2019-09-18 NOTE — Patient Instructions (Signed)
Glad you are feeling better  We will call you with results on Monday.   Rest and hydrate.

## 2019-09-18 NOTE — Progress Notes (Signed)
Margaret Holmes , [birth year], 78 y.o., female MRN: [id number] Patient Care Team    Relationship Specialty Notifications Start End  [name], DO PCP - General Family Medicine  04/09/16   Richmond Campbell, MD Consulting Physician Gastroenterology  05/23/16   Salvadore Dom, MD Consulting Physician Obstetrics and Gynecology  05/23/16   Leroy Sea, MD Referring Physician Urology  08/27/16   [facility]  09/01/18     Chief Complaint  Patient presents with  . Cough    x9 days. Productive cough with nasal congestion. Denies fever or SOB.   . Nasal Congestion  . Sore Throat     Subjective: Pt presents for an OV after recent emergency room visit.  She was diagnosed with a bladder infection.  She was provided with Rocephin x1 dose and  Patient reports she is feeling much better since she began treatment.  She was also having a cough and nasal congestion which has improved.  She tested negative for Covid.  Depression screen [facility] 2/9 09/01/2018 09/01/2018 08/30/2017 08/27/2016 02/07/2016  Decreased Interest 0 0 0 0 0  Down, Depressed, Hopeless 0 0 0 0 0  PHQ - 2 Score 0 0 0 0 0    Allergies  Allergen Reactions  . Codeine Other (See Comments)    Not specified in old records.  Carlton Adam [Propoxyphene N-Acetaminophen] Other (See Comments)    Not specified in old records  . Hydrocodone Other (See Comments)    Not specified in old records  . Valium [Diazepam]     Psychiatric reaction   Social History   Social History Narrative   Married, 3 children, 6 grandchildren, 1 GGc.   Orig from this region.  Drives a school bus (X 31 yrs).   No T/A/Ds.   Caffeine: one cup coffee a day, 3 glasses of sweet tea per day.   No formal exercise.  She is active (NOT sedentary).            Past Medical History:  Diagnosis Date  . Adnexal cyst 07/2013   Left-3cm-noted on CT abd/pelv--f/u pelvic u/s did not show anything (L ovary not visualized,  likely secondary to bowel gas)-repeat pelvic u/s after 07/2014.  Marland Kitchen Anemia   . Asthma   . DDD (degenerative disc disease), cervical   . H/O angular cheilitis    Zinc normal, vit B12 low normal (2012)  . H/O vitamin D deficiency 2011   Came back to normal range with replacement therapydd  . History of adenomatous polyp of colon 2003, 2006, 2013;2016   Repeat 2013 showed tubular adenoma x 3, with no high grade dysplasia.  2016 no polyps-recall 5 yrs  . History of hiatal hernia   . Hyperlipidemia    per pt report 05/09/12; also on labs 06/2015.  Intolerant of pravachol 02/2016  . Hypothyroidism   . Kidney infection   . Lower leg pain    Bilateral, crampy--ABIs/dopplers NORMAL 04/2013.  . 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 PONV (postoperative nausea and vomiting)    "jerks" afterward  . Shortness of breath dyspnea    Past Surgical History:  Procedure Laterality Date  . BAND HEMORRHOIDECTOMY    . CHOLECYSTECTOMY  1980s  . COLONOSCOPY W/ BIOPSIES AND POLYPECTOMY  06/02/12;05/30/15   No polyps 2016-recall 5 yrs.  +internal hem.  Normal ileoscopy.  Marland Kitchen DILATATION & CURETTAGE/HYSTEROSCOPY WITH MYOSURE N/A 05/28/2016  Procedure: DILATATION & CURETTAGE/HYSTEROSCOPY WITH MYOSURE with ULTRASOUND guidance;  Surgeon: Salvadore Dom, MD;  Location: [facility];  Service: Gynecology;  Laterality: N/A;  Please have ultrasound in room. Follow Silva's first case.  Marland Kitchen GANGLION CYST EXCISION  2003   Right wrist, with excision of some triquetrum spurs  . stent placed right kidney    . THYROIDECTOMY, PARTIAL  1972  . TONSILLECTOMY AND ADENOIDECTOMY  age 66  . TUBAL LIGATION    . WRIST SURGERY  2006   For left thumb carpo-metacarpal arthritis   Family History  Problem Relation Age of Onset  . Cancer Mother        ovarian and throat cancer  . Diabetes Mother   . Alcohol abuse Father        alcoholism.  Died age 68  . Cancer Maternal Grandmother        breast  cancer   Allergies as of 09/18/2019      Reactions   Codeine Other (See Comments)   Not specified in old records.   Darvocet [propoxyphene N-acetaminophen] Other (See Comments)   Not specified in old records   Hydrocodone Other (See Comments)   Not specified in old records   Valium [diazepam]    Psychiatric reaction      Medication List       Accurate as of September 18, 2019 11:59 PM. If you have any questions, ask your nurse or doctor.        STOP taking these medications   benzonatate 200 MG capsule Commonly known as: TESSALON Stopped by: Howard Pouch, DO     TAKE these medications   acetaminophen 325 MG tablet Commonly known as: TYLENOL Take 650 mg by mouth every 6 (six) hours as needed.   cefdinir 300 MG capsule Commonly known as: OMNICEF Take by mouth.   cetirizine 10 MG tablet Commonly known as: ZYRTEC Take 10 mg by mouth daily.   diclofenac sodium 1 % Gel Commonly known as: VOLTAREN Apply over affected area QID RPN   fluticasone 50 MCG/ACT nasal spray Commonly known as: FLONASE Place into the nose.   levothyroxine 75 MCG tablet Commonly known as: Synthroid Take 1 tablet (75 mcg total) by mouth daily.   ProAir HFA 108 (90 Base) MCG/ACT inhaler Generic drug: albuterol Inhale 1-2 puffs into the lungs every 6 (six) hours as needed. Reported on 05/15/2016   Vitamin D3 25 MCG (1000 UT) Caps Take 1 capsule by mouth daily.       All past medical history, surgical history, allergies, family history, immunizations andmedications were updated in the EMR today and reviewed under the history and medication portions of their EMR.     ROS: Negative, with the exception of above mentioned in HPI   Objective:  BP 129/77 (BP Location: Right Arm, Patient Position: Sitting, Cuff Size: Normal)   Pulse 66   Temp 98.3 F (36.8 C) (Temporal)   Resp 18   Ht 5\' 2"  (1.575 m)   Wt 112 lb (50.8 kg)   SpO2 95%   BMI 20.49 kg/m  Body mass index is 20.49 kg/m.  Gen: Afebrile. No acute distress. Nontoxic in appearance, well developed, well nourished.  HENT: AT. [place] Eyes:Pupils Equal Round Reactive to light, Extraocular movements intact,  Conjunctiva without redness, discharge or icterus. CV: RRR  Chest: CTAB, no wheeze or crackles. Good air movement, normal resp effort.  Abd: Soft.. NTND. BS present.  No masses palpated. No rebound or guarding.  MSK: No CVA  tenderness Skin: No rashes, purpura or petechiae.  Neuro:  Normal gait. PERLA. EOMi. Alert. Oriented x3   No exam data present No results found. No results found for this or any previous visit (from the past 24 hour(s)).  Assessment/Plan: Margaret Holmes is a 78 y.o. female present for OV for  Urinary tract infection without hematuria, site unspecified Patient symptoms have improved.  She has finished her Omnicef.  She also is slowly starting improved with her upper respiratory infection.  We will recheck her urine today to ensure resolution. - Urinalysis with Culture Reflex  Hyponatremia - Basic Metabolic Panel (BMET)   Reviewed expectations re: course of current medical issues.  Discussed self-management of symptoms.  Outlined signs and symptoms indicating need for more acute intervention.  Patient verbalized understanding and all questions were answered.  Patient received an After-Visit Summary.    Orders Placed This Encounter  Procedures  . Urine Culture  . Urinalysis with Culture Reflex  . Basic Metabolic Panel (BMET)  . REFLEXIVE URINE CULTURE    > 25 minutes spent with patient, >50% of time spent face to face   This visit occurred during the SARS-CoV-2 public health emergency.  Safety protocols were in place, including screening questions prior to the visit, additional usage of staff PPE, and extensive cleaning of exam room while observing appropriate contact time as indicated for disinfecting solutions.    Note is dictated utilizing voice recognition software.  Although note has been proof read prior to signing, occasional typographical errors still can be missed. If any questions arise, please do not hesitate to call for verification.   electronically signed by:  Howard Pouch, DO  [place]

## 2019-09-20 LAB — URINE CULTURE
MICRO NUMBER:: 1126251
Result:: NO GROWTH
SPECIMEN QUALITY:: ADEQUATE

## 2019-09-20 LAB — URINALYSIS W MICROSCOPIC + REFLEX CULTURE
Bacteria, UA: NONE SEEN /HPF
Bilirubin Urine: NEGATIVE
Glucose, UA: NEGATIVE
Hgb urine dipstick: NEGATIVE
Hyaline Cast: NONE SEEN /LPF
Nitrites, Initial: NEGATIVE
Protein, ur: NEGATIVE
Specific Gravity, Urine: 1.02 (ref 1.001–1.03)
pH: 5.5 (ref 5.0–8.0)

## 2019-09-20 LAB — BASIC METABOLIC PANEL
BUN/Creatinine Ratio: 12 (calc) (ref 6–22)
BUN: 11 mg/dL (ref 7–25)
CO2: 27 mmol/L (ref 20–32)
Calcium: 8.6 mg/dL (ref 8.6–10.4)
Chloride: 103 mmol/L (ref 98–110)
Creat: 0.95 mg/dL — ABNORMAL HIGH (ref 0.60–0.93)
Glucose, Bld: 86 mg/dL (ref 65–99)
Potassium: 3.9 mmol/L (ref 3.5–5.3)
Sodium: 140 mmol/L (ref 135–146)

## 2019-09-20 LAB — CULTURE INDICATED

## 2019-09-21 ENCOUNTER — Telehealth: Payer: Self-pay | Admitting: Family Medicine

## 2019-09-21 ENCOUNTER — Encounter: Payer: Self-pay | Admitting: Family Medicine

## 2019-09-21 NOTE — Telephone Encounter (Signed)
Pt was called and detailed message with results

## 2019-09-21 NOTE — Telephone Encounter (Signed)
Please inform patient the following information:  Her urine culture showed no signs of infection.  Her kidney function by blood is normal.

## 2019-10-24 DIAGNOSIS — Z01818 Encounter for other preprocedural examination: Secondary | ICD-10-CM | POA: Diagnosis not present

## 2019-10-27 DIAGNOSIS — H25811 Combined forms of age-related cataract, right eye: Secondary | ICD-10-CM | POA: Diagnosis not present

## 2019-10-27 DIAGNOSIS — H25812 Combined forms of age-related cataract, left eye: Secondary | ICD-10-CM | POA: Diagnosis not present

## 2019-10-27 HISTORY — PX: CATARACT EXTRACTION W/ INTRAOCULAR LENS IMPLANT: SHX1309

## 2019-10-28 ENCOUNTER — Encounter: Payer: Self-pay | Admitting: Family Medicine

## 2019-11-03 DIAGNOSIS — H25811 Combined forms of age-related cataract, right eye: Secondary | ICD-10-CM | POA: Diagnosis not present

## 2019-11-03 DIAGNOSIS — E785 Hyperlipidemia, unspecified: Secondary | ICD-10-CM | POA: Diagnosis not present

## 2019-11-03 DIAGNOSIS — H353132 Nonexudative age-related macular degeneration, bilateral, intermediate dry stage: Secondary | ICD-10-CM | POA: Diagnosis not present

## 2019-11-03 DIAGNOSIS — Z79899 Other long term (current) drug therapy: Secondary | ICD-10-CM | POA: Diagnosis not present

## 2019-11-03 DIAGNOSIS — K219 Gastro-esophageal reflux disease without esophagitis: Secondary | ICD-10-CM | POA: Diagnosis not present

## 2019-11-03 DIAGNOSIS — H25813 Combined forms of age-related cataract, bilateral: Secondary | ICD-10-CM | POA: Diagnosis not present

## 2019-11-03 DIAGNOSIS — E039 Hypothyroidism, unspecified: Secondary | ICD-10-CM | POA: Diagnosis not present

## 2019-11-03 DIAGNOSIS — Z885 Allergy status to narcotic agent status: Secondary | ICD-10-CM | POA: Diagnosis not present

## 2019-11-03 DIAGNOSIS — H52203 Unspecified astigmatism, bilateral: Secondary | ICD-10-CM | POA: Diagnosis not present

## 2019-11-03 DIAGNOSIS — H527 Unspecified disorder of refraction: Secondary | ICD-10-CM | POA: Diagnosis not present

## 2019-11-03 DIAGNOSIS — J45909 Unspecified asthma, uncomplicated: Secondary | ICD-10-CM | POA: Diagnosis not present

## 2019-11-04 DIAGNOSIS — H527 Unspecified disorder of refraction: Secondary | ICD-10-CM | POA: Diagnosis not present

## 2019-11-04 DIAGNOSIS — H52203 Unspecified astigmatism, bilateral: Secondary | ICD-10-CM | POA: Diagnosis not present

## 2019-11-04 DIAGNOSIS — Z961 Presence of intraocular lens: Secondary | ICD-10-CM | POA: Diagnosis not present

## 2019-11-04 DIAGNOSIS — H353132 Nonexudative age-related macular degeneration, bilateral, intermediate dry stage: Secondary | ICD-10-CM | POA: Diagnosis not present

## 2019-11-05 ENCOUNTER — Telehealth: Payer: Self-pay

## 2019-11-05 NOTE — Telephone Encounter (Signed)
Pt went to the eye doctor and they asked if she was a diabetic. Looking back at patients glucose readings and diagnoses pt was advised she did not have diabetes. Pt verbalized understanding

## 2019-11-05 NOTE — Telephone Encounter (Signed)
Patient would like know if she has diabetes. Please call.

## 2019-12-01 DIAGNOSIS — H43813 Vitreous degeneration, bilateral: Secondary | ICD-10-CM | POA: Diagnosis not present

## 2019-12-18 ENCOUNTER — Encounter: Payer: Self-pay | Admitting: Family Medicine

## 2019-12-18 ENCOUNTER — Other Ambulatory Visit: Payer: Self-pay

## 2019-12-18 ENCOUNTER — Ambulatory Visit (INDEPENDENT_AMBULATORY_CARE_PROVIDER_SITE_OTHER): Payer: Medicare Other | Admitting: Family Medicine

## 2019-12-18 VITALS — BP 156/80 | HR 60 | Temp 98.8°F | Resp 17 | Ht 62.0 in | Wt 118.5 lb

## 2019-12-18 DIAGNOSIS — R34 Anuria and oliguria: Secondary | ICD-10-CM

## 2019-12-18 DIAGNOSIS — E039 Hypothyroidism, unspecified: Secondary | ICD-10-CM

## 2019-12-18 DIAGNOSIS — R600 Localized edema: Secondary | ICD-10-CM

## 2019-12-18 MED ORDER — HYDROCHLOROTHIAZIDE 12.5 MG PO CAPS: 12.5000 mg | ORAL_CAPSULE | Freq: Every day | ORAL | 0 refills | Status: DC

## 2019-12-18 MED ORDER — POTASSIUM CHLORIDE CRYS ER 20 MEQ PO TBCR: 10.0000 meq | EXTENDED_RELEASE_TABLET | Freq: Every day | ORAL | 0 refills | Status: DC

## 2019-12-18 NOTE — Progress Notes (Signed)
This visit occurred during the SARS-CoV-2 public health emergency.  Safety protocols were in place, including screening questions prior to the visit, additional usage of staff PPE, and extensive cleaning of exam room while observing appropriate contact time as indicated for disinfecting solutions.    Margaret Holmes , [birth year], 79 y.o., female MRN: [id number] Patient Care Team    Relationship Specialty Notifications Start End  [name], DO PCP - General Family Medicine  04/09/16   Richmond Campbell, MD Consulting Physician Gastroenterology  05/23/16   Salvadore Dom, MD Consulting Physician Obstetrics and Gynecology  05/23/16   Leroy Sea, MD Referring Physician Urology  08/27/16   [facility]  09/01/18     Chief Complaint  Patient presents with  . Joint Swelling    Bilateral leg swelling. Shooting pain in R leg pain from ankle to hip.      Subjective: Pt presents for an OV with complaints of bilateral leg edema of 2 duration.  Associated symptoms include shooting pain from ankle to hip on her right side that originally started with right knee pain of many weeks duration.  Patient denies any changes in activity prior to onset of lower extremity edema.  Patient denies any changes in her diet.  She denies any fever, chills, nausea, vomit, shortness of breath.  She does help care for her grandchildren and is very active.  She denies prior history of lower extremity edema.  She is up 6.5 lbs in 3 mos with BP elevated today.  She denies any changes in her medications or use of anti-inflammatories or steroids recently.    Depression screen [facility] 2/9 09/01/2018 09/01/2018 08/30/2017 08/27/2016 02/07/2016  Decreased Interest 0 0 0 0 0  Down, Depressed, Hopeless 0 0 0 0 0  PHQ - 2 Score 0 0 0 0 0    Allergies  Allergen Reactions  . Codeine Other (See Comments)    Not specified in old records.  Carlton Adam [Propoxyphene N-Acetaminophen] Other  (See Comments)    Not specified in old records  . Hydrocodone Other (See Comments)    Not specified in old records  . Valium [Diazepam]     Psychiatric reaction   Social History   Social History Narrative   Married, 3 children, 6 grandchildren, 1 GGc.   Orig from this region.  Drives a school bus (X 31 yrs).   No T/A/Ds.   Caffeine: one cup coffee a day, 3 glasses of sweet tea per day.   No formal exercise.  She is active (NOT sedentary).            Past Medical History:  Diagnosis Date  . Adnexal cyst 07/2013   Left-3cm-noted on CT abd/pelv--f/u pelvic u/s did not show anything (L ovary not visualized, likely secondary to bowel gas)-repeat pelvic u/s after 07/2014.  Marland Kitchen Anemia   . Asthma   . DDD (degenerative disc disease), cervical   . H/O angular cheilitis    Zinc normal, vit B12 low normal (2012)  . H/O vitamin D deficiency 2011   Came back to normal range with replacement therapydd  . History of adenomatous polyp of colon 2003, 2006, 2013;2016   Repeat 2013 showed tubular adenoma x 3, with no high grade dysplasia.  2016 no polyps-recall 5 yrs  . History of hiatal hernia   . Hyperlipidemia    per pt report 05/09/12; also on labs 06/2015.  Intolerant of pravachol 02/2016  .  Hypothyroidism   . Kidney infection   . Lower leg pain    Bilateral, crampy--ABIs/dopplers NORMAL 04/2013.  . 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 PONV (postoperative nausea and vomiting)    "jerks" afterward  . Shortness of breath dyspnea    Past Surgical History:  Procedure Laterality Date  . BAND HEMORRHOIDECTOMY    . CATARACT EXTRACTION W/ INTRAOCULAR LENS IMPLANT Left 10/27/2019  . CHOLECYSTECTOMY  1980s  . COLONOSCOPY W/ BIOPSIES AND POLYPECTOMY  06/02/12;05/30/15   No polyps 2016-recall 5 yrs.  +internal hem.  Normal ileoscopy.  Marland Kitchen DILATATION & CURETTAGE/HYSTEROSCOPY WITH MYOSURE N/A 05/28/2016   Procedure: DILATATION & CURETTAGE/HYSTEROSCOPY WITH  MYOSURE with ULTRASOUND guidance;  Surgeon: Salvadore Dom, MD;  Location: [facility];  Service: Gynecology;  Laterality: N/A;  Please have ultrasound in room. Follow Silva's first case.  Marland Kitchen GANGLION CYST EXCISION  2003   Right wrist, with excision of some triquetrum spurs  . stent placed right kidney    . THYROIDECTOMY, PARTIAL  1972  . TONSILLECTOMY AND ADENOIDECTOMY  age 34  . TUBAL LIGATION    . WRIST SURGERY  2006   For left thumb carpo-metacarpal arthritis   Family History  Problem Relation Age of Onset  . Cancer Mother        ovarian and throat cancer  . Diabetes Mother   . Alcohol abuse Father        alcoholism.  Died age 18  . Cancer Maternal Grandmother        breast cancer   Allergies as of 12/18/2019      Reactions   Codeine Other (See Comments)   Not specified in old records.   Darvocet [propoxyphene N-acetaminophen] Other (See Comments)   Not specified in old records   Hydrocodone Other (See Comments)   Not specified in old records   Valium [diazepam]    Psychiatric reaction      Medication List       Accurate as of December 18, 2019  3:31 PM. If you have any questions, ask your nurse or doctor.        acetaminophen 325 MG tablet Commonly known as: TYLENOL Take 650 mg by mouth every 6 (six) hours as needed.   cetirizine 10 MG tablet Commonly known as: ZYRTEC Take 10 mg by mouth daily.   diclofenac sodium 1 % Gel Commonly known as: VOLTAREN Apply over affected area QID RPN   fluticasone 50 MCG/ACT nasal spray Commonly known as: FLONASE Place into the nose.   levothyroxine 75 MCG tablet Commonly known as: Synthroid Take 1 tablet (75 mcg total) by mouth daily.   ProAir HFA 108 (90 Base) MCG/ACT inhaler Generic drug: albuterol Inhale 1-2 puffs into the lungs every 6 (six) hours as needed. Reported on 05/15/2016   Vitamin D3 25 MCG (1000 UT) Caps Take 1 capsule by mouth daily.       All past medical history, surgical history, allergies,  family history, immunizations andmedications were updated in the EMR today and reviewed under the history and medication portions of their EMR.     ROS: Negative, with the exception of above mentioned in HPI   Objective:  BP (!) 156/80 (BP Location: Right Arm, Patient Position: Sitting, Cuff Size: Small)   Pulse 60   Temp 98.8 F (37.1 C) (Temporal)   Resp 17   Ht _0  (1.575 m)   Wt 118 lb 8 oz (53.8 kg)  SpO2 97%   BMI 21.67 kg/m  Body mass index is 21.67 kg/m. Gen: Afebrile. No acute distress. Nontoxic in appearance, well developed, well nourished.  HENT: AT. [place]. Eyes:Pupils Equal Round Reactive to light, Extraocular movements intact,  Conjunctiva without redness, discharge or icterus. CV: RRR no murmur, +1 bilateral lower extremity edema Chest: CTAB, no wheeze or crackles. Good air movement, normal resp effort.  Skin: No rashes, purpura or petechiae.  Neuro:  Normal gait. PERLA. EOMi. Alert. Oriented x3  Psych: Normal affect, dress and demeanor. Normal speech. Normal thought content and judgment.  No exam data present No results found. No results found for this or any previous visit (from the past 24 hour(s)).  Assessment/Plan: BRITINEY BLAHNIK is a 79 y.o. female present for OV for  Bilateral lower extremity edema/Hypothyroidism, unspecified type/decreased urine output -Uncertain etiology of her lower extremity edema.  She does have +1 pitting edema, 6 pound weight gain; otherwise no other signs of fluid overload and no shortness of breath. -Keep legs elevated and wear compression stockings over the weekend Start low-dose HCTZ and potassium supplement. Labs collected today. Uncertain if right leg pain is connected to her lower extremity edema.  The edema is most concerning to her at this time therefore will work-up lower extremity edema and hopefully the rest over the weekend with elevation will help with her right leg discomfort as well.  Her knee discomfort sounds  meniscal in nature, ankle and hip discomfort may be compensation from knee discomfort. - TSH - B Nat Peptide - Comp Met (CMET) - Urinalysis, Routine w reflex microscopic Close follow-up will be arranged and plan further discussed, after lab results are received.  May need the low-dose HCTZ long-term.   Reviewed expectations re: course of current medical issues.  Discussed self-management of symptoms.  Outlined signs and symptoms indicating need for more acute intervention.  Patient verbalized understanding and all questions were answered.  Patient received an After-Visit Summary.    No orders of the defined types were placed in this encounter.  No orders of the defined types were placed in this encounter.  Referral Orders  No referral(s) requested today     Note is dictated utilizing voice recognition software. Although note has been proof read prior to signing, occasional typographical errors still can be missed. If any questions arise, please do not hesitate to call for verification.   electronically signed by:  Howard Pouch, DO  [place]

## 2019-12-18 NOTE — Patient Instructions (Addendum)
Nice to see you today.  Wear compression stockings and keep legs elevated over the weekend. Start HCTZ once daily until you hear from [place] on Monday as well as a the potassium supplement also called in.  If any shortness of breath please be seen by ED immediately.      Peripheral Edema  Peripheral edema is swelling that is caused by a buildup of fluid. Peripheral edema most often affects the lower legs, ankles, and feet. It can also develop in the arms, hands, and face. The area of the body that has peripheral edema will look swollen. It may also feel heavy or warm. Your clothes may start to feel tight. Pressing on the area may make a temporary dent in your skin. You may not be able to move your swollen arm or leg as much as usual. There are many causes of peripheral edema. It can happen because of a complication of other conditions such as congestive heart failure, kidney disease, or a problem with your blood circulation. It also can be a side effect of certain medicines or because of an infection. It often happens to women during pregnancy. Sometimes, the cause is not known. Follow these instructions at home: Managing pain, stiffness, and swelling   Raise (elevate) your legs while you are sitting or lying down.  Move around often to prevent stiffness and to lessen swelling.  Do not sit or stand for long periods of time.  Wear support stockings as told by your health care provider. Medicines  Take over-the-counter and prescription medicines only as told by your health care provider.  Your health care provider may prescribe medicine to help your body get rid of excess water (diuretic). General instructions  Pay attention to any changes in your symptoms.  Follow instructions from your health care provider about limiting salt (sodium) in your diet. Sometimes, eating less salt may reduce swelling.  Moisturize skin daily to help prevent skin from cracking and draining.  Keep all  follow-up visits as told by your health care provider. This is important. Contact a health care provider if you have:  A fever.  Edema that starts suddenly or is getting worse, especially if you are pregnant or have a medical condition.  Swelling in only one leg.  Increased swelling, redness, or pain in one or both of your legs.  Drainage or sores at the area where you have edema. Get help right away if you:  Develop shortness of breath, especially when you are lying down.  Have pain in your chest or abdomen.  Feel weak.  Feel faint. Summary  Peripheral edema is swelling that is caused by a buildup of fluid. Peripheral edema most often affects the lower legs, ankles, and feet.  Move around often to prevent stiffness and to lessen swelling. Do not sit or stand for long periods of time.  Pay attention to any changes in your symptoms.  Contact a health care provider if you have edema that starts suddenly or is getting worse, especially if you are pregnant or have a medical condition.  Get help right away if you develop shortness of breath, especially when lying down. This information is not intended to replace advice given to you by your health care provider. Make sure you discuss any questions you have with your health care provider. Document Revised: 07/09/2018 Document Reviewed: 07/09/2018 Elsevier Patient Education  [place].  Chronic Venous Insufficiency Chronic venous insufficiency is a condition where the leg veins cannot effectively pump  blood from the legs to the heart. This happens when the vein walls are either stretched, weakened, or damaged, or when the valves inside the vein are damaged. With the right treatment, you should be able to continue with an active life. This condition is also called venous stasis. What are the causes? Common causes of this condition include:  High blood pressure inside the veins (venous hypertension).  Sitting or standing too  long, causing increased blood pressure in the leg veins.  A blood clot that blocks blood flow in a vein (deep vein thrombosis, DVT).  Inflammation of a vein (phlebitis) that causes a blood clot to form.  Tumors in the pelvis that cause blood to back up. What increases the risk? The following factors may make you more likely to develop this condition:  Having a family history of this condition.  Obesity.  Pregnancy.  Living without enough regular physical activity or exercise (sedentary lifestyle).  Smoking.  Having a job that requires long periods of standing or sitting in one place.  Being a certain age. Women in their 59s and 62s and men in their 36s are more likely to develop this condition. What are the signs or symptoms? Symptoms of this condition include:  Veins that are enlarged, bulging, or twisted (varicose veins).  Skin breakdown or ulcers.  Reddened skin or dark discoloration of skin on the leg between the knee and ankle.  Brown, smooth, tight, and painful skin just above the ankle, usually on the inside of the leg (lipodermatosclerosis).  Swelling of the legs. How is this diagnosed? This condition may be diagnosed based on:  Your medical history.  A physical exam.  Tests, such as: ? A procedure that creates an image of a blood vessel and nearby organs and provides information about blood flow through the blood vessel (duplex ultrasound). ? A procedure that tests blood flow (plethysmography). ? A procedure that looks at the veins using X-ray and dye (venogram). How is this treated? The goals of treatment are to help you return to an active life and to minimize pain or disability. Treatment depends on the severity of your condition, and it may include:  Wearing compression stockings. These can help relieve symptoms and help prevent your condition from getting worse. However, they do not cure the condition.  Sclerotherapy. This procedure involves an  injection of a solution that shrinks damaged veins.  Surgery. This may involve: ? Removing a diseased vein (vein stripping). ? Cutting off blood flow through the vein (laser ablation surgery). ? Repairing or reconstructing a valve within the affected vein. Follow these instructions at home:      Wear compression stockings as told by your health care provider. These stockings help to prevent blood clots and reduce swelling in your legs.  Take over-the-counter and prescription medicines only as told by your health care provider.  Stay active by exercising, walking, or doing different activities. Ask your health care provider what activities are safe for you and how much exercise you need.  Drink enough fluid to keep your urine pale yellow.  Do not use any products that contain nicotine or tobacco, such as cigarettes, e-cigarettes, and chewing tobacco. If you need help quitting, ask your health care provider.  Keep all follow-up visits as told by your health care provider. This is important. Contact a health care provider if you:  Have redness, swelling, or more pain in the affected area.  See a red streak or line that goes up or  down from the affected area.  Have skin breakdown or skin loss in the affected area, even if the breakdown is small.  Get an injury in the affected area. Get help right away if:  You get an injury and an open wound in the affected area.  You have: ? Severe pain that does not get better with medicine. ? Sudden numbness or weakness in the foot or ankle below the affected area. ? Trouble moving your foot or ankle. ? A fever. ? Worse or persistent symptoms. ? Chest pain. ? Shortness of breath. Summary  Chronic venous insufficiency is a condition where the leg veins cannot effectively pump blood from the legs to the heart.  Chronic venous insufficiency occurs when the vein walls become stretched, weakened, or damaged, or when valves within the vein are  damaged.  Treatment depends on how severe your condition is. It often involves wearing compression stockings and may involve having a procedure.  Make sure you stay active by exercising, walking, or doing different activities. Ask your health care provider what activities are safe for you and how much exercise you need. This information is not intended to replace advice given to you by your health care provider. Make sure you discuss any questions you have with your health care provider. Document Revised: 07/08/2018 Document Reviewed: 07/08/2018 Elsevier Patient Education  [place].

## 2019-12-19 LAB — BRAIN NATRIURETIC PEPTIDE: Brain Natriuretic Peptide: 86 pg/mL (ref <100)

## 2019-12-20 LAB — COMPREHENSIVE METABOLIC PANEL
AG Ratio: 1.6 (calc) (ref 1.0–2.5)
ALT: 11 U/L (ref 6–29)
AST: 18 U/L (ref 10–35)
Albumin: 3.7 g/dL (ref 3.6–5.1)
Alkaline phosphatase (APISO): 53 U/L (ref 37–153)
BUN: 8 mg/dL (ref 7–25)
CO2: 27 mmol/L (ref 20–32)
Calcium: 8.5 mg/dL — ABNORMAL LOW (ref 8.6–10.4)
Chloride: 103 mmol/L (ref 98–110)
Creat: 0.83 mg/dL (ref 0.60–0.93)
Globulin: 2.3 g/dL (calc) (ref 1.9–3.7)
Glucose, Bld: 91 mg/dL (ref 65–99)
Potassium: 3.6 mmol/L (ref 3.5–5.3)
Sodium: 141 mmol/L (ref 135–146)
Total Bilirubin: 0.3 mg/dL (ref 0.2–1.2)
Total Protein: 6 g/dL — ABNORMAL LOW (ref 6.1–8.1)

## 2019-12-20 LAB — TSH: TSH: 0.85 mIU/L (ref 0.40–4.50)

## 2019-12-20 LAB — URINALYSIS, ROUTINE W REFLEX MICROSCOPIC
Bilirubin Urine: NEGATIVE
Glucose, UA: NEGATIVE
Hgb urine dipstick: NEGATIVE
Ketones, ur: NEGATIVE
Leukocytes,Ua: NEGATIVE
Nitrite: NEGATIVE
Protein, ur: NEGATIVE
Specific Gravity, Urine: 1.004 (ref 1.001–1.03)
pH: 7 (ref 5.0–8.0)

## 2019-12-20 LAB — URINE CULTURE
MICRO NUMBER:: 10169416
SPECIMEN QUALITY:: ADEQUATE

## 2019-12-21 ENCOUNTER — Telehealth: Payer: Self-pay | Admitting: Family Medicine

## 2019-12-21 NOTE — Telephone Encounter (Signed)
Pt was called and given lab results, she verbalized understanding. She said she still has some swelling but was able to put her shoes on this morning, which she had not been able to do. She wants to see how she does the rest of the day since she has not been up long to see if the swelling comes back. She will call back for appt depending on dose the takes tomorrow.

## 2019-12-21 NOTE — Telephone Encounter (Signed)
Please inform patient the following information: Her liver, kidneys and electrolytes are all within normal limits. Thyroid is functioning normal. Urine culture did not show any particular bacterial overgrowth and urine micro analysis was normal. She did have mildly low protein and mildly low calcium> could be just appearing as abnormal secondary to her increased fluid.  We will recheck and monitor at her next appointment.  Please ask her how her legs appear today? -If swelling has improved, continue HCTZ 12.5 mg daily and follow-up in 1 week -If swelling is unchanged or worsened, increase HCTZ to 25 mg daily and follow-up in 2-3 days.

## 2020-02-12 ENCOUNTER — Telehealth: Payer: Self-pay

## 2020-02-12 NOTE — Telephone Encounter (Signed)
Pt was called and she said she wanted to go to the vein specialist to get the verucose veins rechecked. She has been before. Pt advised if she is already established there then she should be able to call and make appt with them. She was advised she may want to check with insurance just to be sure, she verbalized understanding.   Pt states she took a "chunk" out of her leg outside a couple days ago in the garden.  Td updated in 2017. States she cleaned it really well but it is red and is somewhat painful. Not draining. No fever but warm around the wound. Not bleeding. She was advised Dr Raoul Pitch has no appts today but we could see if another MD had openings. Pt wanted to wait and will call back Monday if it is not better. She was advised if she began to run fever, had drainage, or wound got worse she needed to go to urgent care over the weekend. She verbalized understanding

## 2020-02-12 NOTE — Telephone Encounter (Signed)
Patient wants to know if varicose veins hurt?  Can she do a self referral to the [facility]?  Asked to speak with Wells Guiles.  Please call [phone].

## 2020-03-02 ENCOUNTER — Ambulatory Visit: Payer: Medicare Other | Admitting: Obstetrics and Gynecology

## 2020-06-15 ENCOUNTER — Encounter: Payer: Self-pay | Admitting: Family Medicine

## 2020-06-15 ENCOUNTER — Ambulatory Visit (INDEPENDENT_AMBULATORY_CARE_PROVIDER_SITE_OTHER): Payer: Medicare Other | Admitting: Family Medicine

## 2020-06-15 ENCOUNTER — Other Ambulatory Visit: Payer: Self-pay

## 2020-06-15 VITALS — BP 121/57 | HR 58 | Temp 98.4°F | Resp 16 | Ht 62.0 in | Wt 106.4 lb

## 2020-06-15 DIAGNOSIS — R202 Paresthesia of skin: Secondary | ICD-10-CM

## 2020-06-15 DIAGNOSIS — E611 Iron deficiency: Secondary | ICD-10-CM

## 2020-06-15 DIAGNOSIS — M5441 Lumbago with sciatica, right side: Secondary | ICD-10-CM | POA: Diagnosis not present

## 2020-06-15 DIAGNOSIS — G479 Sleep disorder, unspecified: Secondary | ICD-10-CM

## 2020-06-15 DIAGNOSIS — F439 Reaction to severe stress, unspecified: Secondary | ICD-10-CM | POA: Diagnosis not present

## 2020-06-15 DIAGNOSIS — G8929 Other chronic pain: Secondary | ICD-10-CM

## 2020-06-15 DIAGNOSIS — E559 Vitamin D deficiency, unspecified: Secondary | ICD-10-CM

## 2020-06-15 DIAGNOSIS — R5383 Other fatigue: Secondary | ICD-10-CM | POA: Diagnosis not present

## 2020-06-15 DIAGNOSIS — E039 Hypothyroidism, unspecified: Secondary | ICD-10-CM | POA: Diagnosis not present

## 2020-06-15 DIAGNOSIS — R634 Abnormal weight loss: Secondary | ICD-10-CM | POA: Diagnosis not present

## 2020-06-15 HISTORY — DX: Paresthesia of skin: R20.2

## 2020-06-15 MED ORDER — MIRTAZAPINE 7.5 MG PO TABS: 3.7500 mg | ORAL_TABLET | Freq: Every day | ORAL | 5 refills | Status: DC

## 2020-06-15 MED ORDER — DICLOFENAC SODIUM 75 MG PO TBEC
75.0000 mg | DELAYED_RELEASE_TABLET | Freq: Two times a day (BID) | ORAL | 5 refills | Status: DC
Start: 2020-06-15 — End: 2021-03-03
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
## 2020-06-15 NOTE — Patient Instructions (Addendum)
Start diclofenac every 12 hours for arthritis pain (take with food best). At the very least take before bed.   Start mirtazapine 1/2 tab about 9 pm-- if still not sleeping well after 1 week increase to one tab before bed.    We will call you with lab results.  Follow up in 4 weeks.

## 2020-06-16 ENCOUNTER — Telehealth: Payer: Self-pay | Admitting: Family Medicine

## 2020-06-16 MED ORDER — VITAMIN B-12 1000 MCG PO TABS
1000.0000 ug | ORAL_TABLET | Freq: Every day | ORAL | 3 refills | Status: AC
Start: 2020-06-16 — End: ?

## 2020-06-16 MED ORDER — LEVOTHYROXINE SODIUM 75 MCG PO TABS: ORAL_TABLET | ORAL | 3 refills | Status: DC

## 2020-06-16 NOTE — Telephone Encounter (Signed)
Spoke with pt and verified undedrstanding

## 2020-06-16 NOTE — Telephone Encounter (Signed)
Please call patient: Her thyroid levels are little oversupplemented.  This can cause weight loss and fatigue.  I have refilled her thyroid medication at the same dose of levothyroxine 75 mcg daily-however she will now only take the medication 6 days a week (instead of 7 days a week) on an empty stomach.    -We will recheck these levels when we see her back for her follow-up in about 4 weeks.   Her B12 levels are extremely low.  This can cause fatigue, and muscle aches/nerve pain.  I have called this into her mail-in pharmacy for her to start B12 1000 mcg daily.  She can also elect to purchase over-the-counter if desired.

## 2020-06-16 NOTE — Telephone Encounter (Signed)
Patient does not understand dosage of thyroid medication.  She is thinking she was told she is take half of what she is taken now.  Please call patient or I can advise her. [phone].  Thank you.

## 2020-06-16 NOTE — Telephone Encounter (Signed)
Pt understood that medication was sent to the mail in pharmacy

## 2020-06-17 LAB — IRON,TIBC AND FERRITIN PANEL
%SAT: 26 % (calc) (ref 16–45)
Ferritin: 145 ng/mL (ref 16–288)
Iron: 68 ug/dL (ref 45–160)
TIBC: 260 mcg/dL (calc) (ref 250–450)

## 2020-06-17 LAB — CBC WITH DIFFERENTIAL/PLATELET
Absolute Monocytes: 549 cells/uL (ref 200–950)
Basophils Absolute: 28 cells/uL (ref 0–200)
Basophils Relative: 0.5 %
Eosinophils Absolute: 129 cells/uL (ref 15–500)
Eosinophils Relative: 2.3 %
HCT: 39.2 % (ref 35.0–45.0)
Hemoglobin: 12.7 g/dL (ref 11.7–15.5)
Lymphs Abs: 1495 cells/uL (ref 850–3900)
MCH: 29.5 pg (ref 27.0–33.0)
MCHC: 32.4 g/dL (ref 32.0–36.0)
MCV: 91 fL (ref 80.0–100.0)
MPV: 10.4 fL (ref 7.5–12.5)
Monocytes Relative: 9.8 %
Neutro Abs: 3399 cells/uL (ref 1500–7800)
Neutrophils Relative %: 60.7 %
Platelets: 257 10*3/uL (ref 140–400)
RBC: 4.31 10*6/uL (ref 3.80–5.10)
RDW: 12.6 % (ref 11.0–15.0)
Total Lymphocyte: 26.7 %
WBC: 5.6 10*3/uL (ref 3.8–10.8)

## 2020-06-17 LAB — PROTEIN,TOTAL AND ELECTROPHOR W/IFE
Albumin ELP: 4 g/dL (ref 3.8–4.8)
Alpha 1: 0.3 g/dL (ref 0.2–0.3)
Alpha 2: 0.8 g/dL (ref 0.5–0.9)
Beta 2: 0.3 g/dL (ref 0.2–0.5)
Beta Globulin: 0.4 g/dL (ref 0.4–0.6)
Gamma Globulin: 1.1 g/dL (ref 0.8–1.7)
Immunofix Electr Int: NOT DETECTED
Total Protein: 6.9 g/dL (ref 6.1–8.1)

## 2020-06-17 LAB — COMPREHENSIVE METABOLIC PANEL
AG Ratio: 1.5 (calc) (ref 1.0–2.5)
ALT: 11 U/L (ref 6–29)
AST: 17 U/L (ref 10–35)
Albumin: 4.1 g/dL (ref 3.6–5.1)
Alkaline phosphatase (APISO): 63 U/L (ref 37–153)
BUN: 10 mg/dL (ref 7–25)
CO2: 29 mmol/L (ref 20–32)
Calcium: 9.3 mg/dL (ref 8.6–10.4)
Chloride: 104 mmol/L (ref 98–110)
Creat: 0.85 mg/dL (ref 0.60–0.93)
Globulin: 2.8 g/dL (calc) (ref 1.9–3.7)
Glucose, Bld: 96 mg/dL (ref 65–99)
Potassium: 4.5 mmol/L (ref 3.5–5.3)
Sodium: 142 mmol/L (ref 135–146)
Total Bilirubin: 0.6 mg/dL (ref 0.2–1.2)
Total Protein: 6.9 g/dL (ref 6.1–8.1)

## 2020-06-17 LAB — VITAMIN D 25 HYDROXY (VIT D DEFICIENCY, FRACTURES): Vit D, 25-Hydroxy: 36 ng/mL (ref 30–100)

## 2020-06-17 LAB — VITAMIN B12: Vitamin B-12: 279 pg/mL (ref 200–1100)

## 2020-06-17 LAB — TSH: TSH: 0.34 mIU/L — ABNORMAL LOW (ref 0.40–4.50)

## 2020-06-19 ENCOUNTER — Encounter: Payer: Self-pay | Admitting: Family Medicine

## 2020-06-19 DIAGNOSIS — F439 Reaction to severe stress, unspecified: Secondary | ICD-10-CM | POA: Insufficient documentation

## 2020-06-19 DIAGNOSIS — G479 Sleep disorder, unspecified: Secondary | ICD-10-CM | POA: Insufficient documentation

## 2020-06-27 ENCOUNTER — Telehealth: Payer: Self-pay

## 2020-06-27 NOTE — Telephone Encounter (Signed)
Patient called has questions regarding thyroid meds - what is dosage and instructions on how to take.  She is saying about cutting them in half ?  ( Sig: 1 tab daily p.o. on an empty stomach 6 days a week )   levothyroxine (SYNTHROID) 75 MCG tablet [[id number]   She also wanted to let Dr. Raoul Pitch know that her insurance does not pay for Vitamin B 12 tabs.  Please advise.  Patient can be reached at [phone]

## 2020-06-27 NOTE — Telephone Encounter (Signed)
Please make sure she understands thyroid instructions. This is the 2nd time in 2 weeks she has called in asking about clarification.   If insurance does not cover B12 and she can purchase over-the-counter B12 1000 mcg daily dosing.

## 2020-06-27 NOTE — Telephone Encounter (Signed)
Spoke with pt about levothyrine. Pt verbally stated that she understood that she is not to cut any pills in half. She is to only take the medication every day besides Friday. I also told her that the b12 can be bought over the counter but will discuss with PCP about all options

## 2020-06-28 NOTE — Telephone Encounter (Signed)
Patient was not available at this time but her husband Margaret Holmes will have her call back later today.

## 2020-06-28 NOTE — Telephone Encounter (Signed)
Patient returned call and voiced understanding regarding thyroid medication and getting b12 otc.

## 2020-07-13 ENCOUNTER — Ambulatory Visit (INDEPENDENT_AMBULATORY_CARE_PROVIDER_SITE_OTHER): Payer: Medicare Other | Admitting: Family Medicine

## 2020-07-13 ENCOUNTER — Encounter: Payer: Self-pay | Admitting: Family Medicine

## 2020-07-13 ENCOUNTER — Other Ambulatory Visit: Payer: Self-pay

## 2020-07-13 VITALS — BP 130/63 | HR 64 | Temp 98.0°F | Resp 16 | Ht 62.0 in | Wt 110.2 lb

## 2020-07-13 DIAGNOSIS — R7989 Other specified abnormal findings of blood chemistry: Secondary | ICD-10-CM | POA: Diagnosis not present

## 2020-07-13 DIAGNOSIS — R5383 Other fatigue: Secondary | ICD-10-CM | POA: Diagnosis not present

## 2020-07-13 DIAGNOSIS — E611 Iron deficiency: Secondary | ICD-10-CM | POA: Diagnosis not present

## 2020-07-13 DIAGNOSIS — R8290 Unspecified abnormal findings in urine: Secondary | ICD-10-CM | POA: Diagnosis not present

## 2020-07-13 DIAGNOSIS — R634 Abnormal weight loss: Secondary | ICD-10-CM | POA: Diagnosis not present

## 2020-07-13 DIAGNOSIS — E039 Hypothyroidism, unspecified: Secondary | ICD-10-CM

## 2020-07-13 DIAGNOSIS — E559 Vitamin D deficiency, unspecified: Secondary | ICD-10-CM

## 2020-07-13 DIAGNOSIS — G479 Sleep disorder, unspecified: Secondary | ICD-10-CM

## 2020-07-13 DIAGNOSIS — R202 Paresthesia of skin: Secondary | ICD-10-CM | POA: Diagnosis not present

## 2020-07-13 DIAGNOSIS — Z23 Encounter for immunization: Secondary | ICD-10-CM | POA: Diagnosis not present

## 2020-07-13 DIAGNOSIS — F439 Reaction to severe stress, unspecified: Secondary | ICD-10-CM | POA: Diagnosis not present

## 2020-07-13 LAB — T3, FREE: T3, Free: 4.9 pg/mL — ABNORMAL HIGH (ref 2.3–4.2)

## 2020-07-13 LAB — TSH: TSH: 2.28 u[IU]/mL (ref 0.35–4.50)

## 2020-07-13 LAB — T4, FREE: Free T4: 0.99 ng/dL (ref 0.60–1.60)

## 2020-07-13 MED ORDER — MIRTAZAPINE 7.5 MG PO TABS: 7.5000 mg | ORAL_TABLET | Freq: Every day | ORAL | 5 refills | Status: DC

## 2020-07-13 NOTE — Patient Instructions (Signed)
I will call you with lab results.   I have refilled your medicines. I am glad you are gaining weight.   Increase the mirtazapine to 1 tab a night.
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
## 2020-07-15 ENCOUNTER — Telehealth: Payer: Self-pay | Admitting: Family Medicine

## 2020-07-15 MED ORDER — CEPHALEXIN 500 MG PO CAPS: 500.0000 mg | ORAL_CAPSULE | Freq: Four times a day (QID) | ORAL | 0 refills | Status: DC

## 2020-07-15 NOTE — Telephone Encounter (Signed)
Patient's urine culture is still pending.  However it does look infectious and wanted to start her on antibiotic so it does not worsen over the weekend while we await on cultures.  I have called in this antibiotic for her to start as soon as possible.

## 2020-07-15 NOTE — Telephone Encounter (Signed)
Verified pt understanding  

## 2020-07-16 LAB — URINALYSIS W MICROSCOPIC + REFLEX CULTURE
Bilirubin Urine: NEGATIVE
Glucose, UA: NEGATIVE
Hgb urine dipstick: NEGATIVE
Hyaline Cast: NONE SEEN /LPF
Ketones, ur: NEGATIVE
Nitrites, Initial: POSITIVE — AB
Protein, ur: NEGATIVE
RBC / HPF: NONE SEEN /HPF (ref 0–2)
Specific Gravity, Urine: 1.007 (ref 1.001–1.03)
Squamous Epithelial / HPF: NONE SEEN /HPF (ref <=5)
WBC, UA: >=60 /HPF — AB (ref 0–5)
pH: 6 (ref 5.0–8.0)

## 2020-07-16 LAB — URINE CULTURE
MICRO NUMBER:: 10956151
SPECIMEN QUALITY:: ADEQUATE

## 2020-07-16 LAB — CULTURE INDICATED

## 2020-07-18 ENCOUNTER — Telehealth: Payer: Self-pay | Admitting: Family Medicine

## 2020-07-18 MED ORDER — SULFAMETHOXAZOLE-TRIMETHOPRIM 800-160 MG PO TABS: 1.0000 | ORAL_TABLET | Freq: Two times a day (BID) | ORAL | 0 refills | Status: DC

## 2020-07-18 NOTE — Telephone Encounter (Signed)
Notified husband, Rex, Per DPR

## 2020-07-18 NOTE — Telephone Encounter (Signed)
Please call patient: Her urine culture grew a bacteria that is not sensitive to the antibiotic started while we were waiting on cultures.  The antibiotic prescribed over the weekend would help, but will not cure her from the particular bacteria.  I have called in a new antibiotic that will resolve her UTI.  It is also taken twice a day for 7 days-call Bactrim.  I encouraged her to start this immediately and discontinue prior antibiotic (Keflex).

## 2020-07-18 NOTE — Telephone Encounter (Signed)
Notified pt of results and Rx

## 2020-07-18 NOTE — Telephone Encounter (Signed)
Patient is calling back stating she would like notified of what is going on, as husband did not understand.

## 2020-08-04 ENCOUNTER — Other Ambulatory Visit: Payer: Self-pay | Admitting: Family Medicine

## 2020-09-14 ENCOUNTER — Telehealth: Payer: Self-pay

## 2020-09-14 DIAGNOSIS — Z1211 Encounter for screening for malignant neoplasm of colon: Secondary | ICD-10-CM

## 2020-09-14 NOTE — Telephone Encounter (Signed)
Referral placed for her.  Please make patient aware

## 2020-09-14 NOTE — Telephone Encounter (Signed)
Patient would like to try and get her colonoscopy before the end of year. However, she does not want to go to gastro doctor she seen before. (Dr. Earlean Shawl)  Patient request to see [facility] in [place] office.  No call back is needed. I told patient I would call to schedule appt when entered by Dr. Raoul Pitch and follow up with her once all is resolved.  Thank you!

## 2020-09-14 NOTE — Telephone Encounter (Signed)
Please advise 

## 2020-09-28 ENCOUNTER — Ambulatory Visit: Payer: Medicare Other

## 2020-10-11 DIAGNOSIS — R197 Diarrhea, unspecified: Secondary | ICD-10-CM | POA: Diagnosis not present

## 2020-10-11 DIAGNOSIS — R634 Abnormal weight loss: Secondary | ICD-10-CM | POA: Diagnosis not present

## 2020-10-11 DIAGNOSIS — Z86010 Personal history of colonic polyps: Secondary | ICD-10-CM | POA: Diagnosis not present

## 2020-10-11 DIAGNOSIS — R1319 Other dysphagia: Secondary | ICD-10-CM | POA: Diagnosis not present

## 2020-10-17 DIAGNOSIS — K635 Polyp of colon: Secondary | ICD-10-CM | POA: Diagnosis not present

## 2020-10-17 DIAGNOSIS — D125 Benign neoplasm of sigmoid colon: Secondary | ICD-10-CM | POA: Diagnosis not present

## 2020-10-17 DIAGNOSIS — Z86010 Personal history of colonic polyps: Secondary | ICD-10-CM | POA: Diagnosis not present

## 2020-10-17 DIAGNOSIS — D122 Benign neoplasm of ascending colon: Secondary | ICD-10-CM | POA: Diagnosis not present

## 2020-10-17 DIAGNOSIS — K222 Esophageal obstruction: Secondary | ICD-10-CM | POA: Diagnosis not present

## 2020-10-17 DIAGNOSIS — R1319 Other dysphagia: Secondary | ICD-10-CM | POA: Diagnosis not present

## 2020-10-17 DIAGNOSIS — Z1211 Encounter for screening for malignant neoplasm of colon: Secondary | ICD-10-CM | POA: Diagnosis not present

## 2020-10-17 DIAGNOSIS — D123 Benign neoplasm of transverse colon: Secondary | ICD-10-CM | POA: Diagnosis not present

## 2020-10-17 DIAGNOSIS — D120 Benign neoplasm of cecum: Secondary | ICD-10-CM | POA: Diagnosis not present

## 2020-10-17 DIAGNOSIS — K2100 Gastro-esophageal reflux disease with esophagitis, without bleeding: Secondary | ICD-10-CM | POA: Diagnosis not present

## 2021-01-13 ENCOUNTER — Telehealth: Payer: Self-pay

## 2021-01-13 NOTE — Telephone Encounter (Signed)
Please advise 

## 2021-01-13 NOTE — Telephone Encounter (Signed)
Patient complaining of headache, she has "gunk" in her eyes.  She says it happens every year this time.  States she took allergy med this morning. She declined an appt. She cannot do virtual appts.  She watches her grandchildren from every day from 1:30P-3:30P.  Please advise, she doesn't care if its OTC. [phone]

## 2021-01-13 NOTE — Telephone Encounter (Signed)
Pt informed of provider's instructions. ?

## 2021-01-13 NOTE — Telephone Encounter (Signed)
Would recommend she make sure she is taking her Zyrtec and using her Flonase nasal spray. She can also add over-the-counter eyedrops called Pataday/Patanol that her allergy eyedrops.

## 2021-03-02 ENCOUNTER — Telehealth: Payer: Self-pay | Admitting: Family Medicine

## 2021-03-02 NOTE — Telephone Encounter (Signed)
Pt requested a call back from you, she would not give me any reason

## 2021-03-03 ENCOUNTER — Other Ambulatory Visit: Payer: Self-pay

## 2021-03-03 ENCOUNTER — Ambulatory Visit (INDEPENDENT_AMBULATORY_CARE_PROVIDER_SITE_OTHER): Payer: Medicare Other | Admitting: Family Medicine

## 2021-03-03 ENCOUNTER — Ambulatory Visit (HOSPITAL_BASED_OUTPATIENT_CLINIC_OR_DEPARTMENT_OTHER)
Admission: RE | Admit: 2021-03-03 | Discharge: 2021-03-03 | Disposition: A | Discharge status: Home / Self Care | Payer: Medicare Other | Source: Ambulatory Visit | Attending: Family Medicine | Admitting: Family Medicine

## 2021-03-03 ENCOUNTER — Encounter: Payer: Self-pay | Admitting: Family Medicine

## 2021-03-03 VITALS — BP 111/58 | HR 69 | Temp 98.1°F | Wt 106.0 lb

## 2021-03-03 DIAGNOSIS — M25472 Effusion, left ankle: Secondary | ICD-10-CM

## 2021-03-03 DIAGNOSIS — L03119 Cellulitis of unspecified part of limb: Secondary | ICD-10-CM

## 2021-03-03 DIAGNOSIS — M25572 Pain in left ankle and joints of left foot: Secondary | ICD-10-CM | POA: Diagnosis not present

## 2021-03-03 DIAGNOSIS — M7989 Other specified soft tissue disorders: Secondary | ICD-10-CM | POA: Diagnosis not present

## 2021-03-03 MED ORDER — MUPIROCIN CALCIUM 2 % EX CREA: 1.0000 "application " | TOPICAL_CREAM | Freq: Two times a day (BID) | CUTANEOUS | 1 refills | Status: DC

## 2021-03-03 MED ORDER — DOXYCYCLINE HYCLATE 100 MG PO TABS: 100.0000 mg | ORAL_TABLET | Freq: Two times a day (BID) | ORAL | 0 refills | Status: DC

## 2021-03-03 NOTE — Telephone Encounter (Signed)
Spoke with pt about abrasions on leg. Pt states she has been trying to treat scratches herself but has noticed some swelling. Pt believes leg may be coming infected. Pt agreed to being sched at 215

## 2021-03-03 NOTE — Patient Instructions (Signed)
Please have xray of your ankle completed.  [facility] on [address] Today.   Start antibiotic today- every 12 hours with food. Start cream twice daily and Bag balm at night.

## 2021-03-03 NOTE — Progress Notes (Signed)
This visit occurred during the SARS-CoV-2 public health emergency.  Safety protocols were in place, including screening questions prior to the visit, additional usage of staff PPE, and extensive cleaning of exam room while observing appropriate contact time as indicated for disinfecting solutions.    Margaret Holmes , February 17, 1941, 80 y.o., female MRN: [id number] Patient Care Team    Relationship Specialty Notifications Start End  [name], DO PCP - General Family Medicine  04/09/16   Richmond Campbell, MD Consulting Physician Gastroenterology  05/23/16   Salvadore Dom, MD Consulting Physician Obstetrics and Gynecology  05/23/16   Leroy Sea, MD Referring Physician Urology  08/27/16   [facility]  09/01/18     Chief Complaint  Patient presents with  . Leg Injury    Pt injured leg when cutting firewood with a log splitter and wood hit leg Monday 4/25, Thursday 4/28 and again Thursday 5/5; pt present with multiple abrasions on both legs, pt has swelling around left ankle, redness and c/o pain at site to knee     Subjective: Pt presents for an OV with complaints of injury and possible infection after splitting wood Monday (5 days ago). She states she was using a log splitter and a piece of wood flew back and hit her leg- on more than one occasion.  She has been cleaning with peroxide and putting triple antibiotic ointment on the areas for comfort.  tdap < 5 yrs ago.   She also states she has had lower leg swelling on her left ankle for "sometime." She states it has been over a month of ankle swelling that hurts when she walks on it. She has not wrapped the area and she continues to walk on it and perform heavy yard work.  No personal hr fhx of blood clots.    Depression screen [facility] 2/9 03/03/2021 06/15/2020 09/01/2018 09/01/2018 08/30/2017  Decreased Interest 0 0 0 0 0  Down, Depressed, Hopeless 0 1 0 0 0  PHQ - 2 Score 0 1 0 0 0     Allergies  Allergen Reactions  . Diazepam Other (See Comments)    Psychiatric reaction Caused forgetfulness  . Codeine Other (See Comments)    Not specified in old records.  Carlton Adam [Propoxyphene N-Acetaminophen] Other (See Comments)    Not specified in old records  . Hydrocodone Other (See Comments)    Not specified in old records   Social History   Social History Narrative   Married, 3 children, 6 grandchildren, 1 GGc.   Orig from this region.  Drives a school bus (X 31 yrs).   No T/A/Ds.   Caffeine: one cup coffee a day, 3 glasses of sweet tea per day.   No formal exercise.  She is active (NOT sedentary).            Past Medical History:  Diagnosis Date  . Adnexal cyst 07/2013   Left-3cm-noted on CT abd/pelv--f/u pelvic u/s did not show anything (L ovary not visualized, likely secondary to bowel gas)-repeat pelvic u/s after 07/2014.  Marland Kitchen Anemia   . Asthma   . DDD (degenerative disc disease), cervical   . H/O angular cheilitis    Zinc normal, vit B12 low normal (2012)  . H/O vitamin D deficiency 2011   Came back to normal range with replacement therapydd  . History of adenomatous polyp of colon 2003, 2006, 2013;2016   Repeat 2013 showed tubular adenoma x  3, with no high grade dysplasia.  2016 no polyps-recall 5 yrs  . History of hiatal hernia   . Hyperlipidemia    per pt report 05/09/12; also on labs 06/2015.  Intolerant of pravachol 02/2016  . Hypothyroidism   . Kidney infection   . Lower leg pain    Bilateral, crampy--ABIs/dopplers NORMAL 04/2013.  . 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 PONV (postoperative nausea and vomiting)    "jerks" afterward  . Shortness of breath dyspnea    Past Surgical History:  Procedure Laterality Date  . BAND HEMORRHOIDECTOMY    . CATARACT EXTRACTION W/ INTRAOCULAR LENS IMPLANT Left 10/27/2019  . CHOLECYSTECTOMY  1980s  . COLONOSCOPY W/ BIOPSIES AND POLYPECTOMY  06/02/12;05/30/15   No  polyps 2016-recall 5 yrs.  +internal hem.  Normal ileoscopy.  Marland Kitchen DILATATION & CURETTAGE/HYSTEROSCOPY WITH MYOSURE N/A 05/28/2016   Procedure: DILATATION & CURETTAGE/HYSTEROSCOPY WITH MYOSURE with ULTRASOUND guidance;  Surgeon: Salvadore Dom, MD;  Location: [facility];  Service: Gynecology;  Laterality: N/A;  Please have ultrasound in room. Follow Silva's first case.  Marland Kitchen GANGLION CYST EXCISION  2003   Right wrist, with excision of some triquetrum spurs  . stent placed right kidney    . THYROIDECTOMY, PARTIAL  1972  . TONSILLECTOMY AND ADENOIDECTOMY  age 45  . TUBAL LIGATION    . WRIST SURGERY  2006   For left thumb carpo-metacarpal arthritis   Family History  Problem Relation Age of Onset  . Cancer Mother        ovarian and throat cancer  . Diabetes Mother   . Alcohol abuse Father        alcoholism.  Died age 60  . Cancer Maternal Grandmother        breast cancer   Allergies as of 03/03/2021      Reactions   Diazepam Other (See Comments)   Psychiatric reaction Caused forgetfulness   Codeine Other (See Comments)   Not specified in old records.   Darvocet [propoxyphene N-acetaminophen] Other (See Comments)   Not specified in old records   Hydrocodone Other (See Comments)   Not specified in old records      Medication List       Accurate as of Mar 03, 2021  2:44 PM. If you have any questions, ask your nurse or doctor.        STOP taking these medications   cephALEXin 500 MG capsule Commonly known as: KEFLEX Stopped by: Howard Pouch, DO   diclofenac 75 MG EC tablet Commonly known as: VOLTAREN Stopped by: Howard Pouch, DO   sulfamethoxazole-trimethoprim 800-160 MG tablet Commonly known as: BACTRIM DS Stopped by: Howard Pouch, DO     TAKE these medications   acetaminophen 325 MG tablet Commonly known as: TYLENOL Take 650 mg by mouth every 6 (six) hours as needed.   cetirizine 10 MG tablet Commonly known as: ZYRTEC Take 10 mg by mouth daily.   diclofenac sodium 1 %  Gel Commonly known as: VOLTAREN Apply over affected area QID RPN   doxycycline 100 MG tablet Commonly known as: VIBRA-TABS Take 1 tablet (100 mg total) by mouth 2 (two) times daily. Started by: Howard Pouch, DO   fluticasone 50 MCG/ACT nasal spray Commonly known as: FLONASE Place into the nose.   levothyroxine 75 MCG tablet Commonly known as: Synthroid 1 tab daily p.o. on an empty stomach 6 days a week   mirtazapine 7.5 MG tablet Commonly known  as: REMERON Take 1 tablet (7.5 mg total) by mouth at bedtime.   mupirocin cream 2 % Commonly known as: Bactroban Apply 1 application topically 2 (two) times daily. Started by: Howard Pouch, DO   ProAir HFA 108 (90 Base) MCG/ACT inhaler Generic drug: albuterol Inhale 1-2 puffs into the lungs every 6 (six) hours as needed. Reported on 05/15/2016   vitamin B-12 1000 MCG tablet Commonly known as: CYANOCOBALAMIN Take 1 tablet (1,000 mcg total) by mouth daily.   VITAMIN C-VITAMIN D-ZINC PO Take by mouth daily.       All past medical history, surgical history, allergies, family history, immunizations andmedications were updated in the EMR today and reviewed under the history and medication portions of their EMR.     ROS: Negative, with the exception of above mentioned in HPI   Objective:  BP (!) 111/58   Pulse 69   Temp 98.1 F (36.7 C) (Oral)   Wt 106 lb (48.1 kg)   SpO2 96%   BMI 19.39 kg/m  Body mass index is 19.39 kg/m. Gen: Afebrile. No acute distress. Nontoxic in appearance, well developed, well nourished.  HENT: AT. [place]. Eyes:Pupils Equal Round Reactive to light, Extraocular movements intact,  Conjunctiva without redness, discharge or icterus. Skin: x2 tangerine size abrasions bilateral lower ext. Multiple small areas of abrasions. Redness and swelling present surrounding sites. No drainage present. Left ankle swelling with TTP and pain with dorsiflexion & weight bearing.  Neuro: walking with mild limp. PERLA. EOMi.  Alert. Oriented x3 Psych: Normal affect, dress and demeanor. Normal speech. Normal thought content and judgment.  No exam data present No results found. No results found for this or any previous visit (from the past 24 hour(s)).  Assessment/Plan: HYDIE LANGAN is a 80 y.o. female present for OV for  Left ankle swelling She is uncertain if she injured her ankle. She reports she "turns" her ankle frequently. Injury greater than 1 months ago.  - DG Ankle Complete Left; Future If xray normal- encourage to wrap in ACE and REST.  Would also consider DVT work up, but this has been present for over a month- so more than likely injury.   Cellulitis of lower extremity, unspecified laterality Asked her to please stop using the log splitter and allow her family to do that type of work Doxy bid x 10 d bactroban  - tdap < 5 yrs.  Close follow up in 10 days.    Reviewed expectations re: course of current medical issues.  Discussed self-management of symptoms.  Outlined signs and symptoms indicating need for more acute intervention.  Patient verbalized understanding and all questions were answered.  Patient received an After-Visit Summary.    Orders Placed This Encounter  Procedures  . DG Ankle Complete Left   Meds ordered this encounter  Medications  . mupirocin cream (BACTROBAN) 2 %    Sig: Apply 1 application topically 2 (two) times daily.    Dispense:  60 g    Refill:  1    Ointment or cream - whichever is less expensive.  . doxycycline (VIBRA-TABS) 100 MG tablet    Sig: Take 1 tablet (100 mg total) by mouth 2 (two) times daily.    Dispense:  20 tablet    Refill:  0   Referral Orders  No referral(s) requested today     Note is dictated utilizing voice recognition software. Although note has been proof read prior to signing, occasional typographical errors still can be missed. If any  questions arise, please do not hesitate to call for verification.   electronically  signed by:  Howard Pouch, DO  [place]

## 2021-03-03 NOTE — Telephone Encounter (Signed)
Patient called requesting to seen by Dr. Raoul Pitch today.  A piece of wood fell on her leg and she ended up with several scratches. Infected area where scratches are.  Please call asap [phone].  Dr. Anitra Lauth has an opening if she cant work her in

## 2021-03-06 ENCOUNTER — Telehealth: Payer: Self-pay | Admitting: Family Medicine

## 2021-03-06 MED ORDER — NAPROXEN 500 MG PO TABS: 500.0000 mg | ORAL_TABLET | Freq: Two times a day (BID) | ORAL | 0 refills | Status: DC

## 2021-03-06 NOTE — Telephone Encounter (Signed)
Please inform patient her ankle x-ray does not show any evidence of fracture. -Symptoms currently could be related to a bad strain of her ankle.  Would encourage her to lightly wrapped with an Ace bandage daily for support, try to keep leg elevated when possible.   -I called in naproxen for her to start every 12 hours for 3 to 5 days with food to help with inflammation. -Follow-up has been scheduled for her.  Would encourage her to keep a monitor on her leg for any worsening symptoms.  Follow-up at upcoming appointment, sooner if any symptoms are worsening.

## 2021-03-06 NOTE — Telephone Encounter (Signed)
Called pt and informed of results and recommendations. Informed her about Rx sent for inflammation. Pt verbalized understanding. -Jma

## 2021-03-14 ENCOUNTER — Ambulatory Visit (INDEPENDENT_AMBULATORY_CARE_PROVIDER_SITE_OTHER): Payer: Medicare Other | Admitting: Family Medicine

## 2021-03-14 ENCOUNTER — Encounter: Payer: Self-pay | Admitting: Family Medicine

## 2021-03-14 ENCOUNTER — Ambulatory Visit (HOSPITAL_BASED_OUTPATIENT_CLINIC_OR_DEPARTMENT_OTHER)
Admission: RE | Admit: 2021-03-14 | Discharge: 2021-03-14 | Disposition: A | Discharge status: Home / Self Care | Payer: Medicare Other | Source: Ambulatory Visit | Attending: Family Medicine | Admitting: Family Medicine

## 2021-03-14 ENCOUNTER — Other Ambulatory Visit: Payer: Self-pay

## 2021-03-14 VITALS — BP 111/50 | HR 61 | Temp 98.1°F | Ht 62.0 in | Wt 106.0 lb

## 2021-03-14 DIAGNOSIS — M79605 Pain in left leg: Secondary | ICD-10-CM | POA: Diagnosis not present

## 2021-03-14 DIAGNOSIS — L03119 Cellulitis of unspecified part of limb: Secondary | ICD-10-CM

## 2021-03-14 DIAGNOSIS — M7989 Other specified soft tissue disorders: Secondary | ICD-10-CM

## 2021-03-14 DIAGNOSIS — M25472 Effusion, left ankle: Secondary | ICD-10-CM | POA: Diagnosis not present

## 2021-03-14 NOTE — Progress Notes (Signed)
This visit occurred during the SARS-CoV-2 public health emergency.  Safety protocols were in place, including screening questions prior to the visit, additional usage of staff PPE, and extensive cleaning of exam room while observing appropriate contact time as indicated for disinfecting solutions.    Margaret Holmes , [birth year], 80 y.o., female MRN: [id number] Patient Care Team    Relationship Specialty Notifications Start End  [name], DO PCP - General Family Medicine  04/09/16   Richmond Campbell, MD Consulting Physician Gastroenterology  05/23/16   Salvadore Dom, MD Consulting Physician Obstetrics and Gynecology  05/23/16   Leroy Sea, MD Referring Physician Urology  08/27/16   [facility]  09/01/18     Chief Complaint  Patient presents with  . Follow-up  . Leg Swelling    Pt is not fasting     Subjective: Margaret Holmes is a 80 y.o. Pt presents for an OV to follow up on leg injury.  She states she has been complaint with doxy and naproxen bid. She feels the wounds are greatly improving. She has been placing the bag balm on the area nightly. Ankle xray was normal. She reports the redness and swelling are also mildly improved but still present.   Prior note:  with complaints of injury and possible infection after splitting wood Monday (5 days ago). She states she was using a log splitter and a piece of wood flew back and hit her leg- on more than one occasion.  She has been cleaning with peroxide and putting triple antibiotic ointment on the areas for comfort.  tdap < 5 yrs ago.   She also states she has had lower leg swelling on her left ankle for "sometime." She states it has been over a month of ankle swelling that hurts when she walks on it. She has not wrapped the area and she continues to walk on it and perform heavy yard work.  No personal hr fhx of blood clots.    Depression screen [facility] 2/9 03/03/2021 06/15/2020  09/01/2018 09/01/2018 08/30/2017  Decreased Interest 0 0 0 0 0  Down, Depressed, Hopeless 0 1 0 0 0  PHQ - 2 Score 0 1 0 0 0    Allergies  Allergen Reactions  . Diazepam Other (See Comments)    Psychiatric reaction Caused forgetfulness  . Codeine Other (See Comments)    Not specified in old records.  Carlton Adam [Propoxyphene N-Acetaminophen] Other (See Comments)    Not specified in old records  . Hydrocodone Other (See Comments)    Not specified in old records   Social History   Social History Narrative   Married, 3 children, 6 grandchildren, 1 GGc.   Orig from this region.  Drives a school bus (X 31 yrs).   No T/A/Ds.   Caffeine: one cup coffee a day, 3 glasses of sweet tea per day.   No formal exercise.  She is active (NOT sedentary).            Past Medical History:  Diagnosis Date  . Adnexal cyst 07/2013   Left-3cm-noted on CT abd/pelv--f/u pelvic u/s did not show anything (L ovary not visualized, likely secondary to bowel gas)-repeat pelvic u/s after 07/2014.  Marland Kitchen Anemia   . Asthma   . DDD (degenerative disc disease), cervical   . H/O angular cheilitis    Zinc normal, vit B12 low normal (2012)  . H/O vitamin D deficiency 2011  Came back to normal range with replacement therapydd  . History of adenomatous polyp of colon 2003, 2006, 2013;2016   Repeat 2013 showed tubular adenoma x 3, with no high grade dysplasia.  2016 no polyps-recall 5 yrs  . History of hiatal hernia   . Hyperlipidemia    per pt report 05/09/12; also on labs 06/2015.  Intolerant of pravachol 02/2016  . Hypothyroidism   . Kidney infection   . Lower leg pain    Bilateral, crampy--ABIs/dopplers NORMAL 04/2013.  . 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 PONV (postoperative nausea and vomiting)    "jerks" afterward  . Shortness of breath dyspnea    Past Surgical History:  Procedure Laterality Date  . BAND HEMORRHOIDECTOMY    . CATARACT EXTRACTION W/  INTRAOCULAR LENS IMPLANT Left 10/27/2019  . CHOLECYSTECTOMY  1980s  . COLONOSCOPY W/ BIOPSIES AND POLYPECTOMY  06/02/12;05/30/15   No polyps 2016-recall 5 yrs.  +internal hem.  Normal ileoscopy.  Marland Kitchen DILATATION & CURETTAGE/HYSTEROSCOPY WITH MYOSURE N/A 05/28/2016   Procedure: DILATATION & CURETTAGE/HYSTEROSCOPY WITH MYOSURE with ULTRASOUND guidance;  Surgeon: Salvadore Dom, MD;  Location: [facility];  Service: Gynecology;  Laterality: N/A;  Please have ultrasound in room. Follow Silva's first case.  Marland Kitchen GANGLION CYST EXCISION  2003   Right wrist, with excision of some triquetrum spurs  . stent placed right kidney    . THYROIDECTOMY, PARTIAL  1972  . TONSILLECTOMY AND ADENOIDECTOMY  age 10  . TUBAL LIGATION    . WRIST SURGERY  2006   For left thumb carpo-metacarpal arthritis   Family History  Problem Relation Age of Onset  . Cancer Mother        ovarian and throat cancer  . Diabetes Mother   . Alcohol abuse Father        alcoholism.  Died age 22  . Cancer Maternal Grandmother        breast cancer   Allergies as of 03/14/2021      Reactions   Diazepam Other (See Comments)   Psychiatric reaction Caused forgetfulness   Codeine Other (See Comments)   Not specified in old records.   Darvocet [propoxyphene N-acetaminophen] Other (See Comments)   Not specified in old records   Hydrocodone Other (See Comments)   Not specified in old records      Medication List       Accurate as of Mar 14, 2021 10:58 AM. If you have any questions, ask your nurse or doctor.        STOP taking these medications   doxycycline 100 MG tablet Commonly known as: VIBRA-TABS Stopped by: Howard Pouch, DO   mupirocin cream 2 % Commonly known as: Bactroban Stopped by: Howard Pouch, DO     TAKE these medications   acetaminophen 325 MG tablet Commonly known as: TYLENOL Take 650 mg by mouth every 6 (six) hours as needed.   cetirizine 10 MG tablet Commonly known as: ZYRTEC Take 10 mg by mouth daily.    diclofenac sodium 1 % Gel Commonly known as: VOLTAREN Apply over affected area QID RPN   fluticasone 50 MCG/ACT nasal spray Commonly known as: FLONASE Place into the nose.   levothyroxine 75 MCG tablet Commonly known as: Synthroid 1 tab daily p.o. on an empty stomach 6 days a week   mirtazapine 7.5 MG tablet Commonly known as: REMERON Take 1 tablet (7.5 mg total) by mouth at bedtime.   naproxen 500 MG tablet  Commonly known as: Naprosyn Take 1 tablet (500 mg total) by mouth 2 (two) times daily with a meal.   ProAir HFA 108 (90 Base) MCG/ACT inhaler Generic drug: albuterol Inhale 1-2 puffs into the lungs every 6 (six) hours as needed. Reported on 05/15/2016   vitamin B-12 1000 MCG tablet Commonly known as: CYANOCOBALAMIN Take 1 tablet (1,000 mcg total) by mouth daily.   VITAMIN C-VITAMIN D-ZINC PO Take by mouth daily.       All past medical history, surgical history, allergies, family history, immunizations andmedications were updated in the EMR today and reviewed under the history and medication portions of their EMR.     ROS: Negative, with the exception of above mentioned in HPI   Objective:  BP (!) 111/50   Pulse 61   Temp 98.1 F (36.7 C) (Oral)   Ht 5\' 2"  (1.575 m)   Wt 106 lb (48.1 kg)   SpO2 98%   BMI 19.39 kg/m  Body mass index is 19.39 kg/m. Gen: Afebrile. No acute distress.  HENT: AT. [place]. Skin: mild redness and swelling present- improved but present. TTP medial aspect of LE with focal swelling this area. FROM of ankle. Wounds healing without drainage.  Neuro: Normal gait. PERLA. EOMi. Alert. Oriented x3  Psych: Normal affect, dress and demeanor. Normal speech. Normal thought content and judgment.  No exam data present No results found. No results found for this or any previous visit (from the past 24 hour(s)).  Assessment/Plan: VERTA RIEDLINGER is a 80 y.o. female present for OV for  Left ankle swelling She is uncertain if she injured her  ankle. She reports she "turns" her ankle frequently. Swelling has been present > 6 weeks, prior to log splitter event below. - DG Ankle Complete Left; Future> WNL Area looks better today. It is still swollen and red though. I have concerns original swelling may have been d/t to vascular cause phlebitis or DVT. 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  Continue naproxen BID for 2 weeks.   Cellulitis of lower extremity, unspecified laterality Asked her to please stop using the log splitter and allow her family to do that type of work Complete Doxy course (2 days left) Continue bag balm application nightly.  Area appears much improved from infection/cellulitis stand point- however redness and swelling remain with TTP medial LE.    Reviewed expectations re: course of current medical issues.  Discussed self-management of symptoms.  Outlined signs and symptoms indicating need for more acute intervention.  Patient verbalized understanding and all questions were answered.  Patient received an After-Visit Summary.    Orders Placed This Encounter  Procedures  . US Venous Img Lower Unilateral Left   No orders of the defined types were placed in this encounter.  Referral Orders  No referral(s) requested today     Note is dictated utilizing voice recognition software. Although note has been proof read prior to signing, occasional typographical errors still can be missed. If any questions arise, please do not hesitate to call for verification.   electronically signed by:  Howard Pouch, DO  [place]

## 2021-03-14 NOTE — Patient Instructions (Signed)
Next appt make for mis august- we will collect your fasting labs that day also.   Continue to use the bag balm nightly on legs.    I have ordered a Ultrasound of your leg to make sure there is not a blood clot.

## 2021-03-16 ENCOUNTER — Telehealth: Payer: Self-pay

## 2021-03-16 NOTE — Telephone Encounter (Signed)
She spoke to someone yesterday about [place] results but couldn't hear very well because of her grandson yelling in the background.  Please call patient [phone]

## 2021-03-16 NOTE — Telephone Encounter (Signed)
LVM for pt to CB regarding results.  

## 2021-04-12 ENCOUNTER — Ambulatory Visit (INDEPENDENT_AMBULATORY_CARE_PROVIDER_SITE_OTHER): Payer: Medicare Other | Admitting: *Deleted

## 2021-04-12 DIAGNOSIS — Z0000 Encounter for general adult medical examination without abnormal findings: Secondary | ICD-10-CM

## 2021-04-12 NOTE — Patient Instructions (Addendum)
Margaret Holmes , Thank you for taking time to come for your Medicare Wellness Visit. I appreciate your ongoing commitment to your health goals. Please review the following plan we discussed and let me know if I can assist you in the future.   Screening recommendations/referrals: Colonoscopy: 10-17-2020 completed Mammogram: Declined at this time/ Education provided Bone Density: 10-19-2016 Completed (Normal) Recommended yearly ophthalmology/optometry visit for glaucoma screening and checkup Recommended yearly dental visit for hygiene and checkup  Vaccinations: Influenza vaccine: up to date Pneumococcal vaccine: up to date Tdap vaccine: up to date Shingles vaccine: up to date  Advanced directives: copy requested  Conditions/risks identified: na  Next appointment:  06-06-2021 8:30 [facility]               04-17-2022 @ 8:15 Medical Annual Wellness   Preventive Care 18 Years and Older, Female Preventive care refers to lifestyle choices and visits with your health care provider that can promote health and wellness. What does preventive care include? A yearly physical exam. This is also called an annual well check. Dental exams once or twice a year. Routine eye exams. Ask your health care provider how often you should have your eyes checked. Personal lifestyle choices, including: Daily care of your teeth and gums. Regular physical activity. Eating a healthy diet. Avoiding tobacco and drug use. Limiting alcohol use. Practicing safe sex. Taking low-dose aspirin every day. Taking vitamin and mineral supplements as recommended by your health care provider. What happens during an annual well check? The services and screenings done by your health care provider during your annual well check will depend on your age, overall health, lifestyle risk factors, and family history of disease. Counseling  Your health care provider may ask you questions about your: Alcohol use. Tobacco use. Drug  use. Emotional well-being. Home and relationship well-being. Sexual activity. Eating habits. History of falls. Memory and ability to understand (cognition). Work and work Statistician. Reproductive health. Screening  You may have the following tests or measurements: Height, weight, and BMI. Blood pressure. Lipid and cholesterol levels. These may be checked every 5 years, or more frequently if you are over 33 years old. Skin check. Lung cancer screening. You may have this screening every year starting at age 5 if you have a 30-pack-year history of smoking and currently smoke or have quit within the past 15 years. Fecal occult blood test (FOBT) of the stool. You may have this test every year starting at age 49. Flexible sigmoidoscopy or colonoscopy. You may have a sigmoidoscopy every 5 years or a colonoscopy every 10 years starting at age 56. Hepatitis C blood test. Hepatitis B blood test. Sexually transmitted disease (STD) testing. Diabetes screening. This is done by checking your blood sugar (glucose) after you have not eaten for a while (fasting). You may have this done every 1-3 years. Bone density scan. This is done to screen for osteoporosis. You may have this done starting at age 70. Mammogram. This may be done every 1-2 years. Talk to your health care provider about how often you should have regular mammograms. Talk with your health care provider about your test results, treatment options, and if necessary, the need for more tests. Vaccines  Your health care provider may recommend certain vaccines, such as: Influenza vaccine. This is recommended every year. Tetanus, diphtheria, and acellular pertussis (Tdap, Td) vaccine. You may need a Td booster every 10 years. Zoster vaccine. You may need this after age 72. Pneumococcal 13-valent conjugate (PCV13) vaccine. One dose is  recommended after age 85. Pneumococcal polysaccharide (PPSV23) vaccine. One dose is recommended after age  9. Talk to your health care provider about which screenings and vaccines you need and how often you need them. This information is not intended to replace advice given to you by your health care provider. Make sure you discuss any questions you have with your health care provider. Document Released: 11/11/2015 Document Revised: 07/04/2016 Document Reviewed: 08/16/2015 Elsevier Interactive Patient Education  2017 [place] Prevention in the Home Falls can cause injuries. They can happen to people of all ages. There are many things you can do to make your home safe and to help prevent falls. What can I do on the outside of my home? Regularly fix the edges of walkways and driveways and fix any cracks. Remove anything that might make you trip as you walk through a door, such as a raised step or threshold. Trim any bushes or trees on the path to your home. Use bright outdoor lighting. Clear any walking paths of anything that might make someone trip, such as rocks or tools. Regularly check to see if handrails are loose or broken. Make sure that both sides of any steps have handrails. Any raised decks and porches should have guardrails on the edges. Have any leaves, snow, or ice cleared regularly. Use sand or salt on walking paths during winter. Clean up any spills in your garage right away. This includes oil or grease spills. What can I do in the bathroom? Use night lights. Install grab bars by the toilet and in the tub and shower. Do not use towel bars as grab bars. Use non-skid mats or decals in the tub or shower. If you need to sit down in the shower, use a plastic, non-slip stool. Keep the floor dry. Clean up any water that spills on the floor as soon as it happens. Remove soap buildup in the tub or shower regularly. Attach bath mats securely with double-sided non-slip rug tape. Do not have throw rugs and other things on the floor that can make you trip. What can I do in the  bedroom? Use night lights. Make sure that you have a light by your bed that is easy to reach. Do not use any sheets or blankets that are too big for your bed. They should not hang down onto the floor. Have a firm chair that has side arms. You can use this for support while you get dressed. Do not have throw rugs and other things on the floor that can make you trip. What can I do in the kitchen? Clean up any spills right away. Avoid walking on wet floors. Keep items that you use a lot in easy-to-reach places. If you need to reach something above you, use a strong step stool that has a grab bar. Keep electrical cords out of the way. Do not use floor polish or wax that makes floors slippery. If you must use wax, use non-skid floor wax. Do not have throw rugs and other things on the floor that can make you trip. What can I do with my stairs? Do not leave any items on the stairs. Make sure that there are handrails on both sides of the stairs and use them. Fix handrails that are broken or loose. Make sure that handrails are as long as the stairways. Check any carpeting to make sure that it is firmly attached to the stairs. Fix any carpet that is loose or worn. Avoid having  throw rugs at the top or bottom of the stairs. If you do have throw rugs, attach them to the floor with carpet tape. Make sure that you have a light switch at the top of the stairs and the bottom of the stairs. If you do not have them, ask someone to add them for you. What else can I do to help prevent falls? Wear shoes that: Do not have high heels. Have rubber bottoms. Are comfortable and fit you well. Are closed at the toe. Do not wear sandals. If you use a stepladder: Make sure that it is fully opened. Do not climb a closed stepladder. Make sure that both sides of the stepladder are locked into place. Ask someone to hold it for you, if possible. Clearly mark and make sure that you can see: Any grab bars or  handrails. First and last steps. Where the edge of each step is. Use tools that help you move around (mobility aids) if they are needed. These include: Canes. Walkers. Scooters. Crutches. Turn on the lights when you go into a dark area. Replace any light bulbs as soon as they burn out. Set up your furniture so you have a clear path. Avoid moving your furniture around. If any of your floors are uneven, fix them. If there are any pets around you, be aware of where they are. Review your medicines with your doctor. Some medicines can make you feel dizzy. This can increase your chance of falling. Ask your doctor what other things that you can do to help prevent falls. This information is not intended to replace advice given to you by your health care provider. Make sure you discuss any questions you have with your health care provider. Document Released: 08/11/2009 Document Revised: 03/22/2016 Document Reviewed: 11/19/2014 Elsevier Interactive Patient Education  2017 [employer].

## 2021-04-12 NOTE — Progress Notes (Signed)
Subjective:   Margaret Holmes is a 80 y.o. female who presents for Medicare Annual (Subsequent) preventive examination.  I connected with  [name] on 04/12/21 by a  telephone enabled telemedicine application and verified that I am speaking with the correct person using two identifiers.   I discussed the limitations of evaluation and management by telemedicine. The patient expressed understanding and agreed to proceed.  Patient location: home  Provider location: Tele-health    Review of Systems    NA Cardiac Risk Factors include: advanced age (>47men, >51 women)     Objective:    Today's Vitals   There is no height or weight on file to calculate BMI.  Advanced Directives 04/12/2021 05/28/2016 05/17/2016  Does Patient Have a Medical Advance Directive? No No No  Would patient like information on creating a medical advance directive? No - Patient declined - Yes - Educational materials given    Current Medications (verified) Outpatient Encounter Medications as of 04/12/2021  Medication Sig   acetaminophen (TYLENOL) 325 MG tablet Take 650 mg by mouth every 6 (six) hours as needed.   cetirizine (ZYRTEC) 10 MG tablet Take 10 mg by mouth daily.   diclofenac sodium (VOLTAREN) 1 % GEL Apply over affected area QID RPN   fluticasone (FLONASE) 50 MCG/ACT nasal spray Place into the nose.   levothyroxine (SYNTHROID) 75 MCG tablet 1 tab daily p.o. on an empty stomach 6 days a week   PROAIR HFA 108 (90 Base) MCG/ACT inhaler Inhale 1-2 puffs into the lungs every 6 (six) hours as needed. Reported on 05/15/2016   vitamin B-12 (CYANOCOBALAMIN) 1000 MCG tablet Take 1 tablet (1,000 mcg total) by mouth daily.   VITAMIN C-VITAMIN D-ZINC PO Take by mouth daily.   mirtazapine (REMERON) 7.5 MG tablet Take 1 tablet (7.5 mg total) by mouth at bedtime. (Patient not taking: Reported on 04/12/2021)   naproxen (NAPROSYN) 500 MG tablet Take 1 tablet (500 mg total) by mouth 2 (two) times daily with a meal.  (Patient not taking: Reported on 04/12/2021)   No facility-administered encounter medications on file as of 04/12/2021.    Allergies (verified) Diazepam, Codeine, Darvocet [propoxyphene n-acetaminophen], and Hydrocodone   History: Past Medical History:  Diagnosis Date   Adnexal cyst 07/2013   Left-3cm-noted on CT abd/pelv--f/u pelvic u/s did not show anything (L ovary not visualized, likely secondary to bowel gas)-repeat pelvic u/s after 07/2014.   Anemia    Asthma    DDD (degenerative disc disease), cervical    H/O angular cheilitis    Zinc normal, vit B12 low normal (2012)   H/O vitamin D deficiency 2011   Came back to normal range with replacement therapydd   History of adenomatous polyp of colon 2003, 2006, 2013;2016   Repeat 2013 showed tubular adenoma x 3, with no high grade dysplasia.  2016 no polyps-recall 5 yrs   History of hiatal hernia    Hyperlipidemia    per pt report 05/09/12; also on labs 06/2015.  Intolerant of pravachol 02/2016   Hypothyroidism    Kidney infection    Lower leg pain    Bilateral, crampy--ABIs/dopplers NORMAL 04/2013.   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  +internal hem.  Normal ileoscopy.   DILATATION & CURETTAGE/HYSTEROSCOPY WITH MYOSURE N/A 05/28/2016   Procedure: DILATATION & CURETTAGE/HYSTEROSCOPY WITH MYOSURE with ULTRASOUND guidance;  Surgeon: Salvadore Dom, MD;  Location: [facility];  Service: Gynecology;  Laterality: N/A;  Please have ultrasound in room. Follow Silva's first case.   GANGLION CYST EXCISION  2003   Right wrist, with excision of some  triquetrum spurs   stent placed right kidney     THYROIDECTOMY, PARTIAL  1972   TONSILLECTOMY AND ADENOIDECTOMY  age 74   TUBAL LIGATION     WRIST SURGERY  2006   For left thumb carpo-metacarpal arthritis   Family History  Problem Relation Age of Onset   Cancer Mother        ovarian and throat cancer   Diabetes Mother    Alcohol abuse Father        alcoholism.  Died age 25   Cancer Maternal Grandmother        breast cancer   Social History   Socioeconomic History   Marital status: Married    Spouse name: Not on file   Number of children: Not on file   Years of education: Not on file   Highest education level: Not on file  Occupational History   Not on file  Tobacco Use   Smoking status: Never   Smokeless tobacco: Never  Vaping Use   Vaping Use: Never used  Substance and Sexual Activity   Alcohol use: No    Alcohol/week: 0.0 standard drinks   Drug use: No   Sexual activity: Not Currently  Other Topics Concern   Not on file  Social History Narrative   Married, 3 children, 6 grandchildren, 1 GGc.   Orig from this region.  Drives a school bus (X 31 yrs).   No T/A/Ds.   Caffeine: one cup coffee a day, 3 glasses of sweet tea per day.   No formal exercise.  She is active (NOT sedentary).            Social Determinants of Health   Financial Resource Strain: Low Risk    Difficulty of Paying Living Expenses: Not hard at all  Food Insecurity: No Food Insecurity   Worried About Charity fundraiser in the Last Year: Never true   [place] in the Last Year: Never true  Transportation Needs: No Transportation Needs   Lack of Transportation (Medical): No   Lack of Transportation (Non-Medical): No  Physical Activity: Sufficiently Active   Days of Exercise per Week: 5 days   Minutes of Exercise per Session: 40 min  Stress: No Stress Concern Present   Feeling of Stress : Not at all  Social Connections: Moderately Integrated   Frequency of Communication with Friends  and Family: More than three times a week   Frequency of Social Gatherings with Friends and Family: More than three times a week   Attends Religious Services: 1 to 4 times per year   Active Member of [employer] or Organizations: No   Attends Music therapist: Never   Marital Status: Married    Tobacco Counseling Counseling given: Not Answered   Clinical Intake:  Pre-visit preparation completed: Yes  Pain : No/denies pain     Nutritional Risks: None Diabetes: No  How often do you need to have someone help you when you read  instructions, pamphlets, or other written materials from your doctor or pharmacy?: 1 - Never  Diabetic?  NO  Interpreter Needed?: No  Information entered by :: Leroy Kennedy LPN   Activities of Daily Living In your present state of health, do you have any difficulty performing the following activities: 04/12/2021 04/12/2021  Hearing? N N  Vision? N N  Difficulty concentrating or making decisions? N N  Walking or climbing stairs? N N  Dressing or bathing? N N  Doing errands, shopping? N N  Preparing Food and eating ? N N  Using the Toilet? N N  In the past six months, have you accidently leaked urine? N N  Do you have problems with loss of bowel control? N N  Managing your Medications? N N  Managing your Finances? N N  Housekeeping or managing your Housekeeping? N N  Some recent data might be hidden    Patient Care Team: [name], DO as PCP - General (Family Medicine) Richmond Campbell, MD as Consulting Physician (Gastroenterology) Salvadore Dom, MD as Consulting Physician (Obstetrics and Gynecology) Leroy Sea, MD as Referring Physician (Urology) , [facility])  Indicate any recent Medical Services you may have received from other than [facility] providers in the past year (date may be approximate).     Assessment:   This is a routine wellness examination for [place].  Hearing/Vision  screen Hearing Screening - Comments:: No trouble hearing Vision Screening - Comments:: [facility]   [facility]  Cataract surgery both eyes 2 years  Dietary issues and exercise activities discussed: Current Exercise Habits: Home exercise routine, Type of exercise: walking, Time (Minutes): 40, Frequency (Times/Week): 4, Weekly Exercise (Minutes/Week): 160, Intensity: Moderate   Goals Addressed             This Visit's Progress    Patient Stated       Maintain current lifestyle        Depression Screen PHQ 2/9 Scores 03/03/2021 06/15/2020 09/01/2018 09/01/2018 08/30/2017 08/27/2016 02/07/2016  PHQ - 2 Score 0 1 0 0 0 0 0    Fall Risk Fall Risk  04/12/2021 03/03/2021 06/15/2020 09/01/2018 09/01/2018  Falls in the past year? 0 0 0 0 0  Number falls in past yr: 0 0 0 - 0  Injury with Fall? 0 0 0 - 0  Risk for fall due to : No Fall Risks - - - -  Follow up Falls evaluation completed;Falls prevention discussed Falls evaluation completed Falls evaluation completed Falls evaluation completed Falls evaluation completed    FALL RISK PREVENTION PERTAINING TO THE HOME:  Any stairs in or around the home? Yes  If so, are there any without handrails? Yes  Home free of loose throw rugs in walkways, pet beds, electrical cords, etc? Yes  Adequate lighting in your home to reduce risk of falls? Yes   ASSISTIVE DEVICES UTILIZED TO PREVENT FALLS:  Life alert? No  Use of a cane, walker or w/c? No  Grab bars in the bathroom? No  Shower chair or bench in shower? No  Elevated toilet seat or a handicapped toilet? No   TIMED UP AND GO:  Was the test performed? No .   Tele-health visit    Cognitive Function:  Normal cognitive status assessed by direct observation by this Nurse Health Advisor. No abnormalities found.          Immunizations Immunization History  Administered Date(s) Administered   Fluad Quad(high Dose 65+) 07/29/2019, 07/13/2020  Influenza, High Dose Seasonal PF 07/24/2016,  08/08/2017, 08/14/2018   Influenza,inj,Quad PF,6+ Mos 08/01/2015   Pneumococcal Conjugate-13 07/28/2015   Pneumococcal Polysaccharide-23 08/27/2016   Td 05/07/2016   Zoster Recombinat (Shingrix) 06/21/2018, 08/25/2018    TDAP status: Up to date  Flu Vaccine status: Up to date  Pneumococcal vaccine status: Up to date  Covid-19 vaccine status: Declined, Education has been provided regarding the importance of this vaccine but patient still declined. Advised may receive this vaccine at local pharmacy or Health Dept.or vaccine clinic. Aware to provide a copy of the vaccination record if obtained from local pharmacy or Health Dept. Verbalized acceptance and understanding.  Qualifies for Shingles Vaccine? Yes   Zostavax completed No   Shingrix Completed?: Yes  Screening Tests Health Maintenance  Topic Date Due   COVID-19 Vaccine (1) Never done   INFLUENZA VACCINE  05/29/2021   COLONOSCOPY (Pts 45-90yrs Insurance coverage will need to be confirmed)  10/17/2025   TETANUS/TDAP  05/07/2026   DEXA SCAN  Completed   Hepatitis C Screening  Completed   PNA vac Low Risk Adult  Completed   Zoster Vaccines- Shingrix  Completed   HPV VACCINES  Aged Out    Health Maintenance  Health Maintenance Due  Topic Date Due   COVID-19 Vaccine (1) Never done    Colorectal cancer screening: Type of screening: Colonoscopy. Completed 09-2020. Repeat every 5 years  Mammogram status: Completed 08-18-2018. Repeat every year    Patient will hold off on scheduling /  Education provided  Bone Density status: Completed 10-19-2016. Results reflect: Bone density results: NORMAL. Repeat every 10 years.  Lung Cancer Screening: (Low Dose CT Chest recommended if Age 81-80 years, 30 pack-year currently smoking OR have quit w/in 15years.) does not qualify.   Lung Cancer Screening Referral:   NA  Additional Screening:  Hepatitis C Screening: does not qualify; Completed 08-27-2016  Vision Screening: Recommended  annual ophthalmology exams for early detection of glaucoma and other disorders of the eye. Is the patient up to date with their annual eye exam?  Yes  Who is the provider or what is the name of the office in which the patient attends annual eye exams? [facility], [place] If pt is not established with a provider, would they like to be referred to a provider to establish care?  established .   Dental Screening: Recommended annual dental exams for proper oral hygiene  Community Resource Referral / Chronic Care Management: CRR required this visit?  No   CCM required this visit?  No      Plan:     I have personally reviewed and noted the following in the patient's chart:   Medical and social history Use of alcohol, tobacco or illicit drugs  Current medications and supplements including opioid prescriptions.  Functional ability and status Nutritional status Physical activity Advanced directives List of other physicians Hospitalizations, surgeries, and ER visits in previous 12 months Vitals Screenings to include cognitive, depression, and falls Referrals and appointments  In addition, I have reviewed and discussed with patient certain preventive protocols, quality metrics, and best practice recommendations. A written personalized care plan for preventive services as well as general preventive health recommendations were provided to patient.     Leroy Kennedy, LPN   [id number]   Nurse Notes: NA

## 2021-04-19 ENCOUNTER — Encounter: Payer: Self-pay | Admitting: Family Medicine

## 2021-04-19 ENCOUNTER — Ambulatory Visit (INDEPENDENT_AMBULATORY_CARE_PROVIDER_SITE_OTHER): Payer: Medicare Other | Admitting: Family Medicine

## 2021-04-19 ENCOUNTER — Other Ambulatory Visit: Payer: Self-pay

## 2021-04-19 VITALS — BP 104/61 | HR 64 | Temp 98.0°F | Wt 109.0 lb

## 2021-04-19 DIAGNOSIS — M79605 Pain in left leg: Secondary | ICD-10-CM

## 2021-04-19 DIAGNOSIS — L03119 Cellulitis of unspecified part of limb: Secondary | ICD-10-CM

## 2021-04-19 DIAGNOSIS — M7989 Other specified soft tissue disorders: Secondary | ICD-10-CM

## 2021-04-19 NOTE — Patient Instructions (Signed)
This is not infected today.  I have referred you to wound clinic.  You have slow closure/healing in this area because of your vascular condition in your legs.   Keep soaking and keep cleaning.   You have to keep your feet elevated.

## 2021-04-19 NOTE — Progress Notes (Signed)
This visit occurred during the SARS-CoV-2 public health emergency.  Safety protocols were in place, including screening questions prior to the visit, additional usage of staff PPE, and extensive cleaning of exam room while observing appropriate contact time as indicated for disinfecting solutions.    Margaret Holmes , [birth year], 80 y.o., female MRN: [id number] Patient Care Team    Relationship Specialty Notifications Start End  [name], DO PCP - General Family Medicine  04/09/16   Richmond Campbell, MD Consulting Physician Gastroenterology  05/23/16   Salvadore Dom, MD Consulting Physician Obstetrics and Gynecology  05/23/16   Leroy Sea, MD Referring Physician Urology  08/27/16   [facility]  09/01/18     Chief Complaint  Patient presents with   Leg Pain    Pt c/o L leg pain with swelling that has not healed from prior injury 2 mos ago     Subjective: Margaret Holmes is a 80 y.o. Pt presents for an OV on concerns over her wound of her left lower leg.  She had wounds on bilateral lower extremities from using a wood chipper and having would rebound back at her legs.  She has been treated with Epson salt soaks, topical antibiotic ointments and doxycycline.  She has had an x-ray and an ultrasound this area during her work-up both are unremarkable for blood clot or acute bony abnormality.  She reports when she sits she elevates her foot but admits she has continued her daily activity and she is very active.  She is unable to wear compression stockings secondary to the area of open wound that remains.  She reports her leg is much better, but there is still an area that she is concerned about that is not completely healed and there is still some mild swelling and redness surrounding.   Prior note: follow up on leg injury.  She states she has been complaint with doxy and naproxen bid. She feels the wounds are greatly improving. She has  been placing the bag balm on the area nightly. Ankle xray was normal. She reports the redness and swelling are also mildly improved but still present.   Prior note:  with complaints of injury and possible infection after splitting wood Monday (5 days ago). She states she was using a log splitter and a piece of wood flew back and hit her leg- on more than one occasion.  She has been cleaning with peroxide and putting triple antibiotic ointment on the areas for comfort.  tdap < 5 yrs ago.   She also states she has had lower leg swelling on her left ankle for "sometime." She states it has been over a month of ankle swelling that hurts when she walks on it. She has not wrapped the area and she continues to walk on it and perform heavy yard work.  No personal hr fhx of blood clots.    Depression screen [facility] 2/9 03/03/2021 06/15/2020 09/01/2018 09/01/2018 08/30/2017  Decreased Interest 0 0 0 0 0  Down, Depressed, Hopeless 0 1 0 0 0  PHQ - 2 Score 0 1 0 0 0    Allergies  Allergen Reactions   Diazepam Other (See Comments)    Psychiatric reaction Caused forgetfulness   Codeine Other (See Comments)    Not specified in old records.   Darvocet [Propoxyphene N-Acetaminophen] Other (See Comments)    Not specified in old records   Hydrocodone Other (See Comments)  Not specified in old records   Social History   Social History Narrative   Married, 3 children, 6 grandchildren, 1 GGc.   Orig from this region.  Drives a school bus (X 31 yrs).   No T/A/Ds.   Caffeine: one cup coffee a day, 3 glasses of sweet tea per day.   No formal exercise.  She is active (NOT sedentary).            Past Medical History:  Diagnosis Date   Adnexal cyst 07/2013   Left-3cm-noted on CT abd/pelv--f/u pelvic u/s did not show anything (L ovary not visualized, likely secondary to bowel gas)-repeat pelvic u/s after 07/2014.   Anemia    Asthma    DDD (degenerative disc disease), cervical    H/O angular cheilitis     Zinc normal, vit B12 low normal (2012)   H/O vitamin D deficiency 2011   Came back to normal range with replacement therapydd   History of adenomatous polyp of colon 2003, 2006, 2013;2016   Repeat 2013 showed tubular adenoma x 3, with no high grade dysplasia.  2016 no polyps-recall 5 yrs   History of hiatal hernia    Hyperlipidemia    per pt report 05/09/12; also on labs 06/2015.  Intolerant of pravachol 02/2016   Hypothyroidism    Kidney infection    Lower leg pain    Bilateral, crampy--ABIs/dopplers NORMAL 04/2013.   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  +internal hem.  Normal ileoscopy.   DILATATION & CURETTAGE/HYSTEROSCOPY WITH MYOSURE N/A 05/28/2016   Procedure: DILATATION & CURETTAGE/HYSTEROSCOPY WITH MYOSURE with ULTRASOUND guidance;  Surgeon: Salvadore Dom, MD;  Location: [facility];  Service: Gynecology;  Laterality: N/A;  Please have ultrasound in room. Follow Silva's first case.   GANGLION CYST EXCISION  2003   Right wrist, with excision of some triquetrum spurs   stent placed right kidney     THYROIDECTOMY, PARTIAL  1972   TONSILLECTOMY AND ADENOIDECTOMY  age 21   TUBAL LIGATION     WRIST SURGERY  2006   For left thumb carpo-metacarpal arthritis   Family History  Problem Relation Age of Onset   Cancer Mother        ovarian and throat cancer   Diabetes Mother    Alcohol abuse Father        alcoholism.  Died age 61   Cancer Maternal Grandmother        breast cancer   Allergies as of 04/19/2021       Reactions   Diazepam Other (See Comments)   Psychiatric reaction Caused  forgetfulness   Codeine Other (See Comments)   Not specified in old records.   Darvocet [propoxyphene N-acetaminophen] Other (See Comments)   Not specified in old records   Hydrocodone Other (See Comments)   Not specified in old records        Medication List        Accurate as of April 19, 2021 10:57 AM. If you have any questions, ask your nurse or doctor.  acetaminophen 325 MG tablet Commonly known as: TYLENOL Take 650 mg by mouth every 6 (six) hours as needed.   cetirizine 10 MG tablet Commonly known as: ZYRTEC Take 10 mg by mouth daily.   diclofenac sodium 1 % Gel Commonly known as: VOLTAREN Apply over affected area QID RPN   fluticasone 50 MCG/ACT nasal spray Commonly known as: FLONASE Place into the nose.   levothyroxine 75 MCG tablet Commonly known as: Synthroid 1 tab daily p.o. on an empty stomach 6 days a week   mirtazapine 7.5 MG tablet Commonly known as: REMERON Take 1 tablet (7.5 mg total) by mouth at bedtime.   naproxen 500 MG tablet Commonly known as: Naprosyn Take 1 tablet (500 mg total) by mouth 2 (two) times daily with a meal.   ProAir HFA 108 (90 Base) MCG/ACT inhaler Generic drug: albuterol Inhale 1-2 puffs into the lungs every 6 (six) hours as needed. Reported on 05/15/2016   vitamin B-12 1000 MCG tablet Commonly known as: CYANOCOBALAMIN Take 1 tablet (1,000 mcg total) by mouth daily.   VITAMIN C-VITAMIN D-ZINC PO Take by mouth daily.        All past medical history, surgical history, allergies, family history, immunizations andmedications were updated in the EMR today and reviewed under the history and medication portions of their EMR.     ROS: Negative, with the exception of above mentioned in HPI   Objective:  BP 104/61   Pulse 64   Temp 98 F (36.7 C) (Oral)   Wt 109 lb (49.4 kg)   SpO2 95%   BMI 19.94 kg/m  Body mass index is 19.94 kg/m. Gen: Afebrile. No acute distress.  Nontoxic, pleasant female.  Very  active Gen: Afebrile. No acute distress.  HENT: AT. [place].  MSK/skin: Mild erythema, mild swelling of left lower extremity.  Nickel sized area of nonhealing wound with good granulation tissue, no drainage present. Neuro: Normal gait. PERLA. EOMi. Alert. Oriented x3 Psych: Normal affect, dress and demeanor. Normal speech. Normal thought content and judgment..   No results found. No results found. No results found for this or any previous visit (from the past 24 hour(s)).  Assessment/Plan: Margaret Holmes is a 80 y.o. female present for OV for  Left ankle swelling/delayed wound healing Overall her bilateral lower extremities look greatly improved from original injury.  She is having delayed wound healing and a small area on her left lower shin.  Currently this does not appear infected.  However I suspect this area will continue to have difficulty healing with her history of venous stasis and her continued activity. She understand that staying off of her ankle, keeping area clean and dry and keeping her foot elevated is recommended.  However she is very active and does not sit still long. I recommended referral to wound clinic and she is agreeable to this approach today. Continue Epson salt soaks and antibacterial ointment. - DG Ankle Complete Left; Future> WNL - US venous. WNL -Once area is healed, she needs to return to wearing her compression stocking. Follow-up as needed  Reviewed expectations re: course of current medical issues. Discussed self-management of symptoms. Outlined signs and symptoms indicating need for more acute intervention. Patient verbalized understanding and all questions were answered. Patient received an After-Visit Summary.    Orders Placed This Encounter  Procedures   Ambulatory referral to Wound Clinic    No orders of the defined types were placed in this encounter.  Referral Orders  Ambulatory referral to Wound  Clinic     Note is dictated utilizing  voice recognition software. Although note has been proof read prior to signing, occasional typographical errors still can be missed. If any questions arise, please do not hesitate to call for verification.   electronically signed by:  Howard Pouch, DO  [place]

## 2021-04-27 ENCOUNTER — Other Ambulatory Visit: Payer: Self-pay

## 2021-04-27 ENCOUNTER — Encounter (HOSPITAL_BASED_OUTPATIENT_CLINIC_OR_DEPARTMENT_OTHER): Payer: Medicare Other | Attending: Internal Medicine | Admitting: Internal Medicine

## 2021-04-27 DIAGNOSIS — S81802A Unspecified open wound, left lower leg, initial encounter: Secondary | ICD-10-CM | POA: Diagnosis not present

## 2021-04-27 DIAGNOSIS — E039 Hypothyroidism, unspecified: Secondary | ICD-10-CM

## 2021-04-27 DIAGNOSIS — I872 Venous insufficiency (chronic) (peripheral): Secondary | ICD-10-CM

## 2021-04-27 DIAGNOSIS — W228XXA Striking against or struck by other objects, initial encounter: Secondary | ICD-10-CM | POA: Insufficient documentation

## 2021-04-27 NOTE — Progress Notes (Signed)
Margaret Holmes, Margaret Holmes ([id number]) Visit Report for 04/27/2021 Abuse/Suicide Risk Screen Details Patient Name: Date of Service: Margaret Holmes, Margaret Holmes 04/27/2021 1:15 PM Medical Record Number: [id number] Patient Account Number: [id number] Date of Birth/Sex: Treating RN: [birth year] (79 y.o. Female) Lorrin Jackson Primary Care Zayden Maffei: Howard Pouch Other Clinician: Referring Dorris Pierre: Treating Aldin Drees/Extender: [name], [name] Weeks in Treatment: 0 Abuse/Suicide Risk Screen Items Answer ABUSE RISK SCREEN: Has anyone close to you tried to hurt or harm you recentlyo No Do you feel uncomfortable with anyone in your familyo No Has anyone forced you do things that you didnt want to doo No Electronic Signature(s) Signed: 04/27/2021 6:39:25 PM By: Lorrin Jackson Entered By: Lorrin Jackson on 04/27/2021 13:30:58 -------------------------------------------------------------------------------- Activities of Daily Living Details Patient Name: Date of Service: Margaret Holmes, Margaret Holmes 04/27/2021 1:15 PM Medical Record Number: [id number] Patient Account Number: [id number] Date of Birth/Sex: Treating RN: [birth year] (80 y.o. Female) Lorrin Jackson Primary Care Jaxzen Vanhorn: Howard Pouch Other Clinician: Referring Trinetta Alemu: Treating Shevette Bess/Extender: [name], [name] Weeks in Treatment: 0 Activities of Daily Living Items Answer Activities of Daily Living (Please select one for each item) Drive Automobile Completely Able T Medications ake Completely Able Use T elephone Completely Able Care for Appearance Completely Able Use T oilet Completely Able Bath / Shower Completely Able Dress Self Completely Able Feed Self Completely Able Walk Completely Able Get In / Out Bed Completely Able Housework Completely Able Prepare Meals Completely [place] for Self Completely Able Electronic Signature(s) Signed: 04/27/2021 6:39:25 PM By: Lorrin Jackson Entered  By: Lorrin Jackson on 04/27/2021 13:31:18 -------------------------------------------------------------------------------- Education Screening Details Patient Name: Date of Service: Margaret Kanaris D. 04/27/2021 1:15 PM Medical Record Number: [id number] Patient Account Number: [id number] Date of Birth/Sex: Treating RN: 24-Aug-1941 (80 y.o. Female) Lorrin Jackson Primary Care Eben Choinski: Howard Pouch Other Clinician: Referring Ryllie Nieland: Treating Lakoda Mcanany/Extender: Otelia Santee Weeks in Treatment: 0 Primary Learner Assessed: Patient Learning Preferences/Education Level/Primary Language Learning Preference: Explanation, Demonstration, Printed Material Highest Education Level: High School Preferred Language: English Cognitive Barrier Language Barrier: No Translator Needed: No Memory Deficit: No Emotional Barrier: No Cultural/Religious Beliefs Affecting Medical Care: No Physical Barrier Impaired Vision: Yes Glasses Impaired Hearing: No Decreased Hand dexterity: No Knowledge/Comprehension Knowledge Level: High Comprehension Level: High Ability to understand written instructions: High Ability to understand verbal instructions: High Motivation Anxiety Level: Calm Cooperation: Cooperative Education Importance: Acknowledges Need Interest in Health Problems: Asks Questions Perception: Coherent Willingness to Engage in Self-Management High Activities: Readiness to Engage in Self-Management High Activities: Electronic Signature(s) Signed: 04/27/2021 6:39:25 PM By: Lorrin Jackson Entered By: Lorrin Jackson on 04/27/2021 13:32:09 -------------------------------------------------------------------------------- Fall Risk Assessment Details Patient Name: Date of Service: Margaret Holmes, Margaret D. 04/27/2021 1:15 PM Medical Record Number: [id number] Patient Account Number: [id number] Date of Birth/Sex: Treating RN: Nov 29, 1940 (79 y.o. Female) Lorrin Jackson Primary Care  Madge Therrien: Howard Pouch Other Clinician: Referring Joziah Dollins: Treating Yamari Ventola/Extender: Otelia Santee Weeks in Treatment: 0 Fall Risk Assessment Items Have you had 2 or more falls in the last 12 monthso 0 No Have you had any fall that resulted in injury in the last 12 monthso 0 No FALLS RISK SCREEN History of falling - immediate or within 3 months 0 No Secondary diagnosis (Do you have 2 or more medical diagnoseso) 0 No Ambulatory aid None/bed rest/wheelchair/nurse 0 Yes Crutches/cane/walker 0 No Furniture 0 No Intravenous therapy Access/Saline/Heparin Lock 0 No Gait/Transferring Normal/ bed rest/ wheelchair 0 Yes Weak (short steps with or without shuffle, stooped but  able to lift head while walking, may seek 0 No support from furniture) Impaired (short steps with shuffle, may have difficulty arising from chair, head down, impaired 0 No balance) Mental Status Oriented to own ability 0 Yes Electronic Signature(s) Signed: 04/27/2021 6:39:25 PM By: Lorrin Jackson Entered By: Lorrin Jackson on 04/27/2021 13:32:20 -------------------------------------------------------------------------------- Foot Assessment Details Patient Name: Date of Service: Margaret Kanaris D. 04/27/2021 1:15 PM Medical Record Number: [id number] Patient Account Number: [id number] Date of Birth/Sex: Treating RN: 1940/12/30 (80 y.o. Female) Lorrin Jackson Primary Care Racquelle Hyser: Howard Pouch Other Clinician: Referring Jden Want: Treating Imani Sherrin/Extender: [name], [name] Weeks in Treatment: 0 Foot Assessment Items Site Locations + = Sensation present, - = Sensation absent, C = Callus, U = Ulcer R = Redness, W = Warmth, M = Maceration, PU = Pre-ulcerative lesion F = Fissure, S = Swelling, D = Dryness Assessment Right: Left: Other Deformity: No No Prior Foot Ulcer: No No Prior Amputation: No No Charcot Joint: No No Ambulatory Status: Ambulatory Without Help Gait:  Steady Electronic Signature(s) Signed: 04/27/2021 6:39:25 PM By: Lorrin Jackson Entered By: Lorrin Jackson on 04/27/2021 13:35:48 -------------------------------------------------------------------------------- Nutrition Risk Screening Details Patient Name: Date of Service: Margaret Holmes, Margaret Holmes 04/27/2021 1:15 PM Medical Record Number: [id number] Patient Account Number: [id number] Date of Birth/Sex: Treating RN: May 21, 1941 (80 y.o. Female) Lorrin Jackson Primary Care Sharmarke Cicio: Howard Pouch Other Clinician: Referring Marwin Primmer: Treating Versie Fleener/Extender: [name], [name] Weeks in Treatment: 0 Height (in): 63 Weight (lbs): 104 Body Mass Index (BMI): 18.4 Nutrition Risk Screening Items Score Screening NUTRITION RISK SCREEN: I have an illness or condition that made me change the kind and/or amount of food I eat 0 No I eat fewer than two meals per day 0 No I eat few fruits and vegetables, or milk products 0 No I have three or more drinks of beer, liquor or wine almost every day 0 No I have tooth or mouth problems that make it hard for me to eat 0 No I don't always have enough money to buy the food I need 0 No I eat alone most of the time 0 No I take three or more different prescribed or over-the-counter drugs a day 1 Yes Without wanting to, I have lost or gained 10 pounds in the last six months 0 No I am not always physically able to shop, cook and/or feed myself 0 No Nutrition Protocols Good Risk Protocol 0 No interventions needed Moderate Risk Protocol High Risk Proctocol Risk Level: Good Risk Score: 1 Electronic Signature(s) Signed: 04/27/2021 6:39:25 PM By: Lorrin Jackson Entered By: Lorrin Jackson on 04/27/2021 13:32:32

## 2021-04-27 NOTE — Progress Notes (Signed)
Margaret Holmes, Margaret Holmes ([id number]) Visit Report for 04/27/2021 Chief Complaint Document Details Patient Name: Date of Service: Margaret Holmes, Margaret Holmes 04/27/2021 1:15 PM Medical Record Number: [id number] Patient Account Number: [id number] Date of Birth/Sex: Treating RN: May 05, 1941 (80 y.o. Female) Rhae Hammock Primary Care Provider: Howard Pouch Other Clinician: Referring Provider: Treating Provider/Extender: Otelia Santee Weeks in Treatment: 0 Information Obtained from: Patient Chief Complaint Left lower extremity wound Electronic Signature(s) Signed: 04/27/2021 5:14:34 PM By: Kalman Shan DO Entered By: Kalman Shan on 04/27/2021 17:03:58 -------------------------------------------------------------------------------- Debridement Details Patient Name: Date of Service: Margaret Holmes, Margaret D. 04/27/2021 1:15 PM Medical Record Number: [id number] Patient Account Number: [id number] Date of Birth/Sex: Treating RN: Jan 26, 1941 (80 y.o. Female) Rhae Hammock Primary Care Provider: Howard Pouch Other Clinician: Referring Provider: Treating Provider/Extender: Otelia Santee Weeks in Treatment: 0 Debridement Performed for Assessment: Wound #1 Left,Anterior Lower Leg Performed By: Physician Kalman Shan, DO Debridement Type: Debridement Level of Consciousness (Pre-procedure): Awake and Alert Pre-procedure Verification/Time Out Yes - 14:28 Taken: Start Time: 14:28 Pain Control: Lidocaine T Area Debrided (L x W): otal 0.3 (cm) x 1.6 (cm) = 0.48 (cm) Tissue and other material debrided: Viable, Non-Viable, Slough, Subcutaneous, Skin: Dermis , Skin: Epidermis, Slough Level: Skin/Subcutaneous Tissue Debridement Description: Excisional Instrument: Curette Bleeding: Minimum Hemostasis Achieved: Pressure End Time: 14:28 Procedural Pain: 0 Post Procedural Pain: 0 Response to Treatment: Procedure was tolerated well Level of Consciousness (Post- Awake  and Alert procedure): Post Debridement Measurements of Total Wound Length: (cm) 0.3 Width: (cm) 1.6 Depth: (cm) 0.2 Volume: (cm) 0.075 Character of Wound/Ulcer Post Debridement: Improved Post Procedure Diagnosis Same as Pre-procedure Electronic Signature(s) Signed: 04/27/2021 5:14:34 PM By: Kalman Shan DO Signed: 04/27/2021 5:32:03 PM By: Rhae Hammock RN Entered By: Rhae Hammock on 04/27/2021 14:28:51 -------------------------------------------------------------------------------- HPI Details Patient Name: Date of Service: Margaret Holmes, Margaret D. 04/27/2021 1:15 PM Medical Record Number: [id number] Patient Account Number: [id number] Date of Birth/Sex: Treating RN: Aug 22, 1941 (80 y.o. Female) Rhae Hammock Primary Care Provider: Howard Pouch Other Clinician: Referring Provider: Treating Provider/Extender: Otelia Santee Weeks in Treatment: 0 History of Present Illness HPI Description: Admission 6/30 Margaret Holmes is a 80 year old female with a past medical history of hypothyroidism that presents to the clinic for left lower extremity wound. She states that 4 weeks ago she was cutting wood and a piece of wood hit her anterior shin. She has been using Epson salt baths, hydrogen peroxide and antibiotic ointment to the area. She does report being treated for cellulitis to this leg recently with doxycycline. She currently denies signs of infection. Electronic Signature(s) Signed: 04/27/2021 5:14:34 PM By: Kalman Shan DO Entered By: Kalman Shan on 04/27/2021 17:04:56 -------------------------------------------------------------------------------- Physical Exam Details Patient Name: Date of Service: Margaret Holmes D. 04/27/2021 1:15 PM Medical Record Number: [id number] Patient Account Number: [id number] Date of Birth/Sex: Treating RN: 1940/11/19 (80 y.o. Female) Rhae Hammock Primary Care Provider: Howard Pouch Other Clinician: Referring  Provider: Treating Provider/Extender: [name], [name] Weeks in Treatment: 0 Constitutional respirations regular, non-labored and within target range for patient.. Cardiovascular 2+ dorsalis pedis/posterior tibialis pulses. Psychiatric pleasant and cooperative. Notes Left lower extremity: Open wound to the anterior shin with nonviable tissue present. Post debridement there was granulation tissue throughout. No signs of infection Electronic Signature(s) Signed: 04/27/2021 5:14:34 PM By: Kalman Shan DO Entered By: Kalman Shan on 04/27/2021 17:05:26 -------------------------------------------------------------------------------- Physician Orders Details Patient Name: Date of Service: Margaret Holmes, Margaret D. 04/27/2021 1:15 PM Medical Record Number: [id number] Patient Account Number: [id number]  Date of Birth/Sex: Treating RN: 31-Dec-1940 (80 y.o. Female) Rhae Hammock Primary Care Provider: Howard Pouch Other Clinician: Referring Provider: Treating Provider/Extender: Otelia Santee Weeks in Treatment: 0 Verbal / Phone Orders: No Diagnosis Coding ICD-10 Coding Code Description 7434688828 Unspecified open wound, left lower leg, initial encounter I87.2 Venous insufficiency (chronic) (peripheral) E03.9 Hypothyroidism, unspecified Follow-up Appointments Return Appointment in 1 week. Bathing/ Shower/ Hygiene May shower with protection but do not get wound dressing(s) wet. Edema Control - Lymphedema / SCD / Other Elevate legs to the level of the heart or above for 30 minutes Margaret and/or when sitting, a frequency of: Avoid standing for long periods of time. Wound Treatment Wound #1 - Lower Leg Wound Laterality: Left, Anterior Cleanser: Soap and Water 1 x Per Week/15 Days Discharge Instructions: May shower and wash wound with dial antibacterial soap and water prior to dressing change. Prim Dressing: Hydrofera Blue Classic Foam, 2x2 in 1 x Per  Week/15 Days ary Discharge Instructions: Moisten with saline prior to applying to wound bed Prim Dressing: Santyl Ointment 1 x Per Week/15 Days ary Discharge Instructions: Apply nickel thick amount to wound bed as instructed Secondary Dressing: Woven Gauze Sponge, Non-Sterile 4x4 in 1 x Per Week/15 Days Discharge Instructions: Apply over primary dressing as directed. Secondary Dressing: ABD Pad, 5x9 1 x Per Week/15 Days Discharge Instructions: Apply over primary dressing as directed. Compression Wrap: 66M Coban2 Lite, Two-Layer Compression System 1 x Per Week/15 Days Discharge Instructions: May use kerlix and coban if there is no 2-layer system to use Electronic Signature(s) Signed: 04/27/2021 5:14:34 PM By: Kalman Shan DO Entered By: Kalman Shan on 04/27/2021 17:05:46 -------------------------------------------------------------------------------- Problem List Details Patient Name: Date of Service: Margaret Holmes, Margaret Basta D. 04/27/2021 1:15 PM Medical Record Number: [id number] Patient Account Number: [id number] Date of Birth/Sex: Treating RN: [birth year] (80 y.o. Female) Rhae Hammock Primary Care Provider: Howard Pouch Other Clinician: Referring Provider: Treating Provider/Extender: Otelia Santee Weeks in Treatment: 0 Active Problems ICD-10 Encounter Code Description Active Date MDM Diagnosis S81.802A Unspecified open wound, left lower leg, initial encounter 04/27/2021 No Yes I87.2 Venous insufficiency (chronic) (peripheral) 04/27/2021 No Yes E03.9 Hypothyroidism, unspecified 04/27/2021 No Yes Inactive Problems Resolved Problems Electronic Signature(s) Signed: 04/27/2021 5:14:34 PM By: Kalman Shan DO Entered By: Kalman Shan on 04/27/2021 17:03:46 -------------------------------------------------------------------------------- Progress Note Details Patient Name: Date of Service: Margaret Holmes, Margaret D. 04/27/2021 1:15 PM Medical Record Number:  [id number] Patient Account Number: [id number] Date of Birth/Sex: Treating RN: [birth year] (80 y.o. Female) Rhae Hammock Primary Care Provider: Howard Pouch Other Clinician: Referring Provider: Treating Provider/Extender: Otelia Santee Weeks in Treatment: 0 Subjective Chief Complaint Information obtained from Patient Left lower extremity wound History of Present Illness (HPI) Admission 6/30 Ms. Margaret Holmes is a 81 year old female with a past medical history of hypothyroidism that presents to the clinic for left lower extremity wound. She states that 4 weeks ago she was cutting wood and a piece of wood hit her anterior shin. She has been using Epson salt baths, hydrogen peroxide and antibiotic ointment to the area. She does report being treated for cellulitis to this leg recently with doxycycline. She currently denies signs of infection. Patient History Unable to Obtain Patient History due to Altered Mental Status. Information obtained from Patient. Allergies diazepam, codeine, Darvocet-N, hydrocodone Family History Cancer - Mother,Maternal Grandparents, Diabetes - Mother,Father, Heart Disease - Father, Hypertension - Father, No family history of Hereditary Spherocytosis, Kidney Disease, Lung Disease, Seizures, Stroke, Thyroid Problems, Tuberculosis. Social History Never smoker, Marital  Status - Married, Alcohol Use - Never, Drug Use - No History, Caffeine Use - Margaret - T Coffee. ea, Medical History Eyes Patient has history of Cataracts - Had surgery Hematologic/Lymphatic Patient has history of Anemia Musculoskeletal Patient has history of Osteoarthritis Hospitalization/Surgery History - Band Hemorrhoidectomy. - Cataract Extraction w/ Implant 12/29/2. - Polypectomy 2800,3491. - DandC 04/2016. - Partial Thyroidectomy 1972. - Carpal Tunnel Surgery 28. Medical A Surgical History Notes nd Endocrine Hypothyroidism- partial thyroidectomy Review of Systems  (ROS) Eyes Complains or has symptoms of Glasses / Contacts - Reading. Ear/Nose/Mouth/Throat Denies complaints or symptoms of Chronic sinus problems or rhinitis. Respiratory Denies complaints or symptoms of Chronic or frequent coughs, Shortness of Breath. Cardiovascular Denies complaints or symptoms of Chest pain. Gastrointestinal Denies complaints or symptoms of Frequent diarrhea, Nausea, Vomiting. Genitourinary Denies complaints or symptoms of Frequent urination. Integumentary (Skin) Complains or has symptoms of Wounds. Neurologic Denies complaints or symptoms of Numbness/parasthesias. Psychiatric Denies complaints or symptoms of Claustrophobia, Suicidal. Objective Constitutional respirations regular, non-labored and within target range for patient.. Vitals Time Taken: 1:18 PM, Height: 63 in, Source: Stated, Weight: 104 lbs, Source: Stated, BMI: 18.4, Temperature: 98.2 F, Pulse: 58 bpm, Respiratory Rate: 16 breaths/min, Blood Pressure: 127/71 mmHg. Cardiovascular 2+ dorsalis pedis/posterior tibialis pulses. Psychiatric pleasant and cooperative. General Notes: Left lower extremity: Open wound to the anterior shin with nonviable tissue present. Post debridement there was granulation tissue throughout. No signs of infection Integumentary (Hair, Skin) Wound #1 status is Open. Original cause of wound was Trauma. The date acquired was: 02/28/2021. The wound is located on the Left,Anterior Lower Leg. The wound measures 0.3cm length x 1.6cm width x 0.2cm depth; 0.377cm^2 area and 0.075cm^3 volume. There is Fat Layer (Subcutaneous Tissue) exposed. There is no tunneling or undermining noted. There is a medium amount of serosanguineous drainage noted. The wound margin is distinct with the outline attached to the wound base. There is large (67-100%) red granulation within the wound bed. There is a small (1-33%) amount of necrotic tissue within the wound bed including Adherent  Slough. 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 Post debridement to the area had mostly granulation tissue present. She also has some edema. ABI is 1.09 on the right. I think she would benefit from [facility] and compression wrap. No signs of infection on exam. I will see her in a week. Procedures Wound #1 Pre-procedure diagnosis of Wound #1 is a Trauma, Other located on the Left,Anterior Lower Leg . There was a Excisional Skin/Subcutaneous Tissue Debridement with a total area of 0.48 sq cm performed by Kalman Shan, DO. With the following instrument(s): Curette to remove Viable and Non-Viable tissue/material. Material removed includes Subcutaneous Tissue, Slough, Skin: Dermis, and Skin: Epidermis after achieving pain control using Lidocaine. No specimens were taken. A time out was conducted at 14:28, prior to the start of the procedure. A Minimum amount of bleeding was controlled with Pressure. The procedure was tolerated well with a pain level of 0 throughout and a pain level of 0 following the procedure. Post Debridement Measurements: 0.3cm length x 1.6cm width x 0.2cm depth; 0.075cm^3 volume. Character of Wound/Ulcer Post Debridement is improved. Post procedure Diagnosis Wound #1: Same as Pre-Procedure Plan Follow-up Appointments: Return Appointment in 1 week. Bathing/ Shower/ Hygiene: May shower with protection but do not get wound dressing(s) wet. Edema Control -  Lymphedema / SCD / Other: Elevate legs to the level of the heart or above for 30 minutes Margaret and/or when sitting, a frequency of: Avoid standing for long periods of time. WOUND #1: - Lower Leg Wound Laterality: Left, Anterior Cleanser: Soap and Water 1 x Per Week/15 Days Discharge Instructions:  May shower and wash wound with dial antibacterial soap and water prior to dressing change. Prim Dressing: Hydrofera Blue Classic Foam, 2x2 in 1 x Per Week/15 Days ary Discharge Instructions: Moisten with saline prior to applying to wound bed Prim Dressing: Santyl Ointment 1 x Per Week/15 Days ary Discharge Instructions: Apply nickel thick amount to wound bed as instructed Secondary Dressing: Woven Gauze Sponge, Non-Sterile 4x4 in 1 x Per Week/15 Days Discharge Instructions: Apply over primary dressing as directed. Secondary Dressing: ABD Pad, 5x9 1 x Per Week/15 Days Discharge Instructions: Apply over primary dressing as directed. Com pression Wrap: 23M Coban2 Lite, Two-Layer Compression System 1 x Per Week/15 Days Discharge Instructions: May use kerlix and coban if there is no 2-layer system to use 1. In office sharp debridement 2. Santyl with Hydrofera Blue under 2 layer compression 3. Follow-up in 1 week Electronic Signature(s) Signed: 04/27/2021 5:14:34 PM By: Kalman Shan DO Entered By: Kalman Shan on 04/27/2021 17:12:18 -------------------------------------------------------------------------------- HxROS Details Patient Name: Date of Service: Margaret Holmes, Margaret D. 04/27/2021 1:15 PM Medical Record Number: [id number] Patient Account Number: [id number] Date of Birth/Sex: Treating RN: [birth year] (80 y.o. Female) Lorrin Jackson Primary Care Provider: Howard Pouch Other Clinician: Referring Provider: Treating Provider/Extender: Otelia Santee Weeks in Treatment: 0 Unable to Obtain Patient History due to Altered Mental Status Information Obtained From Patient Eyes Complaints and Symptoms: Positive for: Glasses / Contacts - Reading Medical History: Positive for: Cataracts - Had surgery Ear/Nose/Mouth/Throat Complaints and Symptoms: Negative for: Chronic sinus problems or rhinitis Respiratory Complaints and Symptoms: Negative for: Chronic or frequent  coughs; Shortness of Breath Cardiovascular Complaints and Symptoms: Negative for: Chest pain Gastrointestinal Complaints and Symptoms: Negative for: Frequent diarrhea; Nausea; Vomiting Genitourinary Complaints and Symptoms: Negative for: Frequent urination Integumentary (Skin) Complaints and Symptoms: Positive for: Wounds Neurologic Complaints and Symptoms: Negative for: Numbness/parasthesias Psychiatric Complaints and Symptoms: Negative for: Claustrophobia; Suicidal Hematologic/Lymphatic Medical History: Positive for: Anemia Endocrine Medical History: Past Medical History Notes: Hypothyroidism- partial thyroidectomy Immunological Musculoskeletal Medical History: Positive for: Osteoarthritis Oncologic HBO Extended History Items Eyes: Cataracts Immunizations Pneumococcal Vaccine: Received Pneumococcal Vaccination: Yes Implantable Devices None Hospitalization / Surgery History Type of Hospitalization/Surgery Band Hemorrhoidectomy Cataract Extraction w/ Implant 12/29/2 Polypectomy 2013,2016 DandC 04/2016 Partial Thyroidectomy 1972 Carpal Tunnel Surgery 28 Family and Social History Cancer: Yes - Mother,Maternal Grandparents; Diabetes: Yes - Mother,Father; Heart Disease: Yes - Father; Hereditary Spherocytosis: No; Hypertension: Yes - Father; Kidney Disease: No; Lung Disease: No; Seizures: No; Stroke: No; Thyroid Problems: No; Tuberculosis: No; Never smoker; Marital Status - Married; Alcohol Use: Never; Drug Use: No History; Caffeine Use: Margaret - T Coffee; Financial Concerns: No; Food, Clothing or Shelter Needs: No; Support ea, System Lacking: No; Transportation Concerns: No Electronic Signature(s) Signed: 04/27/2021 5:14:34 PM By: Kalman Shan DO Signed: 04/27/2021 6:39:25 PM By: Lorrin Jackson Entered By: Lorrin Jackson on 04/27/2021 13:30:50 -------------------------------------------------------------------------------- SuperBill Details Patient Name: Date  of Service: Margaret Holmes, Margaret D. 04/27/2021 Medical Record Number: [id number] Patient Account Number: [id number] Date of Birth/Sex: Treating RN: 1941-04-12 (80 y.o. Female) Rhae Hammock Primary Care Provider: Howard Pouch Other Clinician: Referring Provider: Treating Provider/Extender: [name], [name] Weeks in Treatment: 0 Diagnosis Coding ICD-10 Codes Code  Description S81.802A Unspecified open wound, left lower leg, initial encounter I87.2 Venous insufficiency (chronic) (peripheral) E03.9 Hypothyroidism, unspecified Facility Procedures CPT4 Code: [id number] Description: 38329 - WOUND CARE VISIT-LEV 3 EST PT Modifier: Quantity: 1 CPT4 Code: [id number] Description: 04599 - DEB SUBQ TISSUE 20 SQ CM/< ICD-10 Diagnosis Description E03.9 Hypothyroidism, unspecified Modifier: Quantity: 1 Physician Procedures : CPT4 Code Description Modifier [id number] [place] PHYS LEVEL 3 NEW PT ICD-10 Diagnosis Description S81.802A Unspecified open wound, left lower leg, initial encounter I87.2 Venous insufficiency (chronic) (peripheral) E03.9 Hypothyroidism, unspecified Quantity: 1 : [id number] - WC PHYS SUBQ TISS 20 SQ CM ICD-10 Diagnosis Description E03.9 Hypothyroidism, unspecified Quantity: 1 Electronic Signature(s) Signed: 04/27/2021 5:14:34 PM By: Kalman Shan DO Entered By: Kalman Shan on 04/27/2021 17:14:10

## 2021-05-02 NOTE — Progress Notes (Signed)
[name], [name] ([id number]) Visit Report for 04/27/2021 Allergy List Details Patient Name: Date of Service: [name], [name] 04/27/2021 1:15 PM Medical Record Number: [id number] Patient Account Number: [id number] Date of Birth/Sex: Treating RN: 1941/06/22 (80 y.o. Female) Lorrin Jackson Primary Care Rekia Kujala: Howard Pouch Other Clinician: Referring Lynnex Fulp: Treating Gevorg Brum/Extender: [name], [name] Weeks in Treatment: 0 Allergies Active Allergies diazepam codeine Darvocet-N hydrocodone Allergy Notes Electronic Signature(s) Signed: 04/27/2021 6:39:25 PM By: Lorrin Jackson Entered By: Lorrin Jackson on 04/27/2021 13:21:47 -------------------------------------------------------------------------------- Arrival Information Details Patient Name: Date of Service: Margaret Holmes, Margaret D. 04/27/2021 1:15 PM Medical Record Number: [id number] Patient Account Number: [id number] Date of Birth/Sex: Treating RN: September 13, 1941 (80 y.o. Female) Lorrin Jackson Primary Care Dennie Vecchio: Howard Pouch Other Clinician: Referring Arvon Schreiner: Treating Gregory Dowe/Extender: Otelia Santee Weeks in Treatment: 0 Visit Information Patient Arrived: Ambulatory Arrival Time: 13:17 Transfer Assistance: None Patient Identification Verified: Yes Secondary Verification Process Completed: Yes Patient Requires Transmission-Based Precautions: No Patient Has Alerts: No Electronic Signature(s) Signed: 04/27/2021 6:39:25 PM By: Lorrin Jackson Entered By: Lorrin Jackson on 04/27/2021 13:18:21 -------------------------------------------------------------------------------- Clinic Level of Care Assessment Details Patient Name: Date of Service: Margaret Holmes, Margaret Holmes 04/27/2021 1:15 PM Medical Record Number: [id number] Patient Account Number: [id number] Date of Birth/Sex: Treating RN: 08-04-1941 (80 y.o. Female) Rhae Hammock Primary Care Desean Heemstra: Howard Pouch Other Clinician: Referring  Nolene Rocks: Treating Rafaela Dinius/Extender: Otelia Santee Weeks in Treatment: 0 Clinic Level of Care Assessment Items TOOL 1 Quantity Score X- 1 0 Use when EandM and Procedure is performed on INITIAL visit ASSESSMENTS - Nursing Assessment / Reassessment X- 1 20 General Physical Exam (combine w/ comprehensive assessment (listed just below) when performed on new pt. evals) X- 1 25 Comprehensive Assessment (HX, ROS, Risk Assessments, Wounds Hx, etc.) ASSESSMENTS - Wound and Skin Assessment / Reassessment []  - 0 Dermatologic / Skin Assessment (not related to wound area) ASSESSMENTS - Ostomy and/or Continence Assessment and Care []  - 0 Incontinence Assessment and Management []  - 0 Ostomy Care Assessment and Management (repouching, etc.) PROCESS - Coordination of Care X - Simple Patient / Family Education for ongoing care 1 15 []  - 0 Complex (extensive) Patient / Family Education for ongoing care X- 1 10 Staff obtains Programmer, systems, Records, T Results / Process Orders est []  - 0 Staff telephones HHA, Nursing Homes / Clarify orders / etc []  - 0 Routine Transfer to another Facility (non-emergent condition) []  - 0 Routine Hospital Admission (non-emergent condition) X- 1 15 New Admissions / Biomedical engineer / Ordering NPWT Apligraf, etc. , []  - 0 Emergency Hospital Admission (emergent condition) PROCESS - Special Needs []  - 0 Pediatric / Minor Patient Management []  - 0 Isolation Patient Management []  - 0 Hearing / Language / Visual special needs []  - 0 Assessment of Community assistance (transportation, D/C planning, etc.) []  - 0 Additional assistance / Altered mentation []  - 0 Support Surface(s) Assessment (bed, cushion, seat, etc.) INTERVENTIONS - Miscellaneous []  - 0 External ear exam []  - 0 Patient Transfer (multiple staff / Civil Service fast streamer / Similar devices) []  - 0 Simple Staple / Suture removal (25 or less) []  - 0 Complex Staple / Suture removal (26 or  more) []  - 0 Hypo/Hyperglycemic Management (do not check if billed separately) X- 1 15 Ankle / Brachial Index (ABI) - do not check if billed separately Has the patient been seen at the hospital within the last three years: Yes Total Score: 100 Level Of Care: New/Established - Level 3 Electronic Signature(s) Signed: 04/27/2021 5:32:03  PM By: Rhae Hammock RN Signed: 04/27/2021 5:32:03 PM By: Rhae Hammock RN Entered By: Rhae Hammock on 04/27/2021 14:42:36 -------------------------------------------------------------------------------- Encounter Discharge Information Details Patient Name: Date of Service: Margaret Holmes, Margaret D. 04/27/2021 1:15 PM Medical Record Number: [id number] Patient Account Number: [id number] Date of Birth/Sex: Treating RN: [birth year] (80 y.o. Female) Levan Hurst Primary Care Jaelynn Pozo: Howard Pouch Other Clinician: Referring Inita Uram: Treating Brinley Rosete/Extender: Otelia Santee Weeks in Treatment: 0 Encounter Discharge Information Items Post Procedure Vitals Discharge Condition: Stable Temperature (F): 98.2 Ambulatory Status: Ambulatory Pulse (bpm): 58 Discharge Destination: Home Respiratory Rate (breaths/min): 16 Transportation: Private Auto Blood Pressure (mmHg): 127/71 Accompanied By: alone Schedule Follow-up Appointment: Yes Clinical Summary of Care: Patient Declined Electronic Signature(s) Signed: 05/02/2021 7:10:28 PM By: Levan Hurst RN, BSN Entered By: Levan Hurst on 04/27/2021 17:23:11 -------------------------------------------------------------------------------- Lower Extremity Assessment Details Patient Name: Date of Service: Margaret Holmes, Margaret Basta D. 04/27/2021 1:15 PM Medical Record Number: [id number] Patient Account Number: [id number] Date of Birth/Sex: Treating RN: 1940-12-15 (81 y.o. Female) Lorrin Jackson Primary Care Shadai Mcclane: Howard Pouch Other Clinician: Referring Icis Budreau: Treating Eiko Mcgowen/Extender:  [name], [name] Weeks in Treatment: 0 Edema Assessment Assessed: [Left: Yes] [Right: Yes] Edema: [Left: Yes] [Right: No] Calf Left: Right: Point of Measurement: 27 cm From Medial Instep 32 cm 31.3 cm Ankle Left: Right: Point of Measurement: 9 cm From Medial Instep 21.6 cm 19.6 cm Knee To Floor Left: Right: From Medial Instep 38 cm 38 cm Vascular Assessment Pulses: Dorsalis Pedis Palpable: [Left:Yes] [Right:Yes] Blood Pressure: Brachial: [Right:127] Ankle: [Right:Dorsalis Pedis: 138 1.09] Electronic Signature(s) Signed: 04/27/2021 6:39:25 PM By: Lorrin Jackson Entered By: Lorrin Jackson on 04/27/2021 13:52:00 -------------------------------------------------------------------------------- Multi Wound Chart Details Patient Name: Date of Service: Margaret Holmes, Margaret Basta D. 04/27/2021 1:15 PM Medical Record Number: [id number] Patient Account Number: [id number] Date of Birth/Sex: Treating RN: 07/04/1941 (80 y.o. Female) Rhae Hammock Primary Care Claris Guymon: Howard Pouch Other Clinician: Referring Tranae Laramie: Treating Dannae Kato/Extender: [name], [name] Weeks in Treatment: 0 Vital Signs Height(in): 63 Pulse(bpm): 58 Weight(lbs): 104 Blood Pressure(mmHg): 127/71 Body Mass Index(BMI): 18 Temperature(F): 98.2 Respiratory Rate(breaths/min): 16 Photos: [1:No Photos Left, Anterior Lower Leg] [N/A:N/A N/A] Wound Location: [1:Trauma] [N/A:N/A] Wounding Event: [1:Trauma, Other] [N/A:N/A] Primary Etiology: [1:Cataracts, Anemia, Osteoarthritis] [N/A:N/A] Comorbid History: [1:02/28/2021] [N/A:N/A] Date Acquired: [1:0] [N/A:N/A] Weeks of Treatment: [1:Open] [N/A:N/A] Wound Status: [1:0.3x1.6x0.2] [N/A:N/A] Measurements L x W x D (cm) [1:0.377] [N/A:N/A] A (cm) : rea [1:0.075] [N/A:N/A] Volume (cm) : [1:Full Thickness Without Exposed] [N/A:N/A] Classification: [1:Support Structures Medium] [N/A:N/A] Exudate A mount: [1:Serosanguineous]  [N/A:N/A] Exudate Type: [1:red, brown] [N/A:N/A] Exudate Color: [1:Distinct, outline attached] [N/A:N/A] Wound Margin: [1:Large (67-100%)] [N/A:N/A] Granulation A mount: [1:Red] [N/A:N/A] Granulation Quality: [1:Small (1-33%)] [N/A:N/A] Necrotic A mount: [1:Fat Layer (Subcutaneous Tissue): Yes N/A] Exposed Structures: [1:Fascia: No Tendon: No Muscle: No Joint: No Bone: No None] [N/A:N/A] Epithelialization: [1:Debridement - Excisional] [N/A:N/A] Debridement: Pre-procedure Verification/Time Out 14:28 [N/A:N/A] Taken: [1:Lidocaine] [N/A:N/A] Pain Control: [1:Subcutaneous, Slough] [N/A:N/A] Tissue Debrided: [1:Skin/Subcutaneous Tissue] [N/A:N/A] Level: [1:0.48] [N/A:N/A] Debridement A (sq cm): [1:rea Curette] [N/A:N/A] Instrument: [1:Minimum] [N/A:N/A] Bleeding: [1:Pressure] [N/A:N/A] Hemostasis A chieved: [1:0] [N/A:N/A] Procedural Pain: [1:0] [N/A:N/A] Post Procedural Pain: [1:Procedure was tolerated well] [N/A:N/A] Debridement Treatment Response: [1:0.3x1.6x0.2] [N/A:N/A] Post Debridement Measurements L x W x D (cm) [1:0.075] [N/A:N/A] Post Debridement Volume: (cm) [1:Debridement] [N/A:N/A] Procedures Performed: Treatment Notes Electronic Signature(s) Signed: 04/27/2021 5:14:34 PM By: Kalman Shan DO Signed: 04/27/2021 5:32:03 PM By: Rhae Hammock RN Entered By: Kalman Shan on 04/27/2021 17:03:50 -------------------------------------------------------------------------------- Multi-Disciplinary Care Plan Details Patient Name: Date of Service: Margaret Holmes, Margaret D. 04/27/2021 1:15 PM Medical Record Number: [id number] Patient Account Number: [id number] Date of Birth/Sex: Treating RN: 06/18/1941 (80 y.o. Female) Rhae Hammock Primary Care Abbe Bula: Howard Pouch Other Clinician: Referring Amorita Vanrossum: Treating Buck Mcaffee/Extender: Otelia Santee Weeks in Treatment: 0 Active Inactive Orientation to the Wound Care Program Nursing Diagnoses: Knowledge  deficit related to the wound healing center program Goals: Patient/caregiver will verbalize understanding of the [place] Date Initiated: 04/27/2021 Target Resolution Date: 05/04/2021 Goal Status: Active Interventions: Provide education on orientation to the wound center Notes: Wound/Skin Impairment Nursing Diagnoses: Impaired tissue integrity Knowledge deficit related to ulceration/compromised skin integrity Goals: Patient will have a decrease in wound volume by X% from date: (specify in notes) Date Initiated: 04/27/2021 Target Resolution Date: 05/04/2021 Goal Status: Active Patient/caregiver will verbalize understanding of skin care regimen Date Initiated: 04/27/2021 Target Resolution Date: 05/01/2021 Goal Status: Active Ulcer/skin breakdown will have a volume reduction of 30% by week 4 Date Initiated: 04/27/2021 Target Resolution Date: 05/01/2021 Goal Status: Active Interventions: Assess patient/caregiver ability to obtain necessary supplies Assess patient/caregiver ability to perform ulcer/skin care regimen upon admission and as needed Assess ulceration(s) every visit Notes: Electronic Signature(s) Signed: 04/27/2021 5:32:03 PM By: Rhae Hammock RN Entered By: Rhae Hammock on 04/27/2021 14:27:15 -------------------------------------------------------------------------------- Pain Assessment Details Patient Name: Date of Service: Margaret Kanaris D. 04/27/2021 1:15 PM Medical Record Number: [id number] Patient Account Number: [id number] Date of Birth/Sex: Treating RN: Aug 11, 1941 (80 y.o. Female) Lorrin Jackson Primary Care Jahiem Franzoni: Howard Pouch Other Clinician: Referring Fowler Antos: Treating Agatha Duplechain/Extender: Otelia Santee Weeks in Treatment: 0 Active Problems Location of Pain Severity and Description of Pain Patient Has Paino Yes Site Locations Pain Location: Pain in Ulcers With Dressing Change: Yes Duration of the  Pain. Constant / Intermittento Intermittent Rate the pain. Current Pain Level: 3 Character of Pain Describe the Pain: Aching, Tender, Throbbing Pain Management and Medication Current Pain Management: Medication: Yes Cold Application: No Rest: Yes Massage: No Activity: No T.E.N.S.: No Heat Application: No Leg drop or elevation: No Is the Current Pain Management Adequate: Inadequate How does your wound impact your activities of daily livingo Sleep: No Bathing: No Appetite: No Relationship With Others: No Bladder Continence: No Emotions: No Bowel Continence: No Work: No Toileting: No Drive: No Dressing: No Hobbies: No Electronic Signature(s) Signed: 04/27/2021 6:39:25 PM By: Lorrin Jackson Entered By: Lorrin Jackson on 04/27/2021 13:33:17 -------------------------------------------------------------------------------- Patient/Caregiver Education Details Patient Name: Date of Service: Margaret Kanaris D. 6/30/2022andnbsp1:15 PM Medical Record Number: [id number] Patient Account Number: [id number] Date of Birth/Gender: Treating RN: [birth year] (80 y.o. Female) Rhae Hammock Primary Care Physician: Howard Pouch Other Clinician: Referring Physician: Treating Physician/Extender: Berneice Gandy in Treatment: 0 Education Assessment Education Provided To: Patient Education Topics Provided Welcome T [facility] [place]: o Methods: Explain/Verbal Responses: State content correctly Electronic Signature(s) Signed: 04/27/2021 5:32:03 PM By: Rhae Hammock RN Entered By: Rhae Hammock on 04/27/2021 14:27:26 -------------------------------------------------------------------------------- Wound Assessment Details Patient Name: Date of Service: Margaret Kanaris D. 04/27/2021 1:15 PM Medical Record Number: [id number] Patient Account Number: [id number] Date of Birth/Sex: Treating RN: 1941-01-27 (80 y.o. Female) Lorrin Jackson Primary Care  Rheanna Sergent: Howard Pouch Other Clinician: Referring Tzirel Leonor: Treating Adilyn Humes/Extender: [name], [name] Weeks in Treatment: 0 Wound Status Wound Number: 1 Primary Etiology: Trauma, Other Wound Location: Left, Anterior Lower Leg Wound Status: Open Wounding Event: Trauma Comorbid History: Cataracts, Anemia, Osteoarthritis Date Acquired: 02/28/2021 Weeks Of Treatment: 0 Clustered Wound: No Photos Wound Measurements Length: (cm) 0.3  Width: (cm) 1.6 Depth: (cm) 0.2 Area: (cm) 0.377 Volume: (cm) 0.075 % Reduction in Area: 0% % Reduction in Volume: 0% Epithelialization: None Tunneling: No Undermining: No Wound Description Classification: Full Thickness Without Exposed Support Structures Wound Margin: Distinct, outline attached Exudate Amount: Medium Exudate Type: Serosanguineous Exudate Color: red, brown Wound Bed Granulation Amount: Large (67-100%) Granulation Quality: Red Necrotic Amount: Small (1-33%) Necrotic Quality: Adherent Slough Foul Odor After Cleansing: No Slough/Fibrino Yes Exposed Structure Fascia Exposed: No Fat Layer (Subcutaneous Tissue) Exposed: Yes Tendon Exposed: No Muscle Exposed: No Joint Exposed: No Bone Exposed: No Treatment Notes Wound #1 (Lower Leg) Wound Laterality: Left, Anterior Cleanser Soap and Water Discharge Instruction: May shower and wash wound with dial antibacterial soap and water prior to dressing change. Peri-Wound Care Topical Primary Dressing Hydrofera Blue Classic Foam, 2x2 in Discharge Instruction: Moisten with saline prior to applying to wound bed Santyl Ointment Discharge Instruction: Apply nickel thick amount to wound bed as instructed Secondary Dressing Woven Gauze Sponge, Non-Sterile 4x4 in Discharge Instruction: Apply over primary dressing as directed. ABD Pad, 5x9 Discharge Instruction: Apply over primary dressing as directed. Secured With Compression Wrap 38M Coban2 Lite, Two-Layer Compression  System Discharge Instruction: May use kerlix and coban if there is no 2-layer system to use Compression Stockings Add-Ons Electronic Signature(s) Signed: 04/27/2021 6:39:25 PM By: Lorrin Jackson Signed: 04/28/2021 10:25:49 AM By: Sandre Kitty Entered By: Sandre Kitty on 04/27/2021 17:20:05 -------------------------------------------------------------------------------- Vitals Details Patient Name: Date of Service: Margaret Holmes, Margaret D. 04/27/2021 1:15 PM Medical Record Number: [id number] Patient Account Number: [id number] Date of Birth/Sex: Treating RN: Aug 16, 1941 (80 y.o. Female) Lorrin Jackson Primary Care Ly Bacchi: Howard Pouch Other Clinician: Referring Mahamadou Weltz: Treating Merary Garguilo/Extender: [name], [name] Weeks in Treatment: 0 Vital Signs Time Taken: 13:18 Temperature (F): 98.2 Height (in): 63 Pulse (bpm): 58 Source: Stated Respiratory Rate (breaths/min): 16 Weight (lbs): 104 Blood Pressure (mmHg): 127/71 Source: Stated Reference Range: 80 - 120 mg / dl Body Mass Index (BMI): 18.4 Electronic Signature(s) Signed: 04/27/2021 6:39:25 PM By: Lorrin Jackson Entered By: Lorrin Jackson on 04/27/2021 13:20:46

## 2021-05-05 ENCOUNTER — Other Ambulatory Visit: Payer: Self-pay

## 2021-05-05 ENCOUNTER — Encounter (HOSPITAL_BASED_OUTPATIENT_CLINIC_OR_DEPARTMENT_OTHER): Payer: Medicare Other | Attending: Internal Medicine | Admitting: Internal Medicine

## 2021-05-05 DIAGNOSIS — I872 Venous insufficiency (chronic) (peripheral): Secondary | ICD-10-CM

## 2021-05-05 DIAGNOSIS — I890 Lymphedema, not elsewhere classified: Secondary | ICD-10-CM | POA: Insufficient documentation

## 2021-05-05 DIAGNOSIS — Z833 Family history of diabetes mellitus: Secondary | ICD-10-CM | POA: Insufficient documentation

## 2021-05-05 DIAGNOSIS — L98499 Non-pressure chronic ulcer of skin of other sites with unspecified severity: Secondary | ICD-10-CM | POA: Diagnosis not present

## 2021-05-05 DIAGNOSIS — S81802D Unspecified open wound, left lower leg, subsequent encounter: Secondary | ICD-10-CM

## 2021-05-05 DIAGNOSIS — E039 Hypothyroidism, unspecified: Secondary | ICD-10-CM

## 2021-05-09 NOTE — Progress Notes (Signed)
ANNAKATE, SOULIER ([id number]) Visit Report for 05/05/2021 Arrival Information Details Patient Name: Date of Service: LAMAE, FOSCO 05/05/2021 8:15 A M Medical Record Number: [id number] Patient Account Number: [id number] Date of Birth/Sex: Treating RN: [birth year] (80 y.o. Sue Lush Primary Care Marliyah Reid: Howard Pouch Other Clinician: Referring Violeta Lecount: Treating Chrystian Ressler/Extender: Otelia Santee Weeks in Treatment: 1 Visit Information History Since Last Visit Added or deleted any medications: No Patient Arrived: Ambulatory Any new allergies or adverse reactions: No Arrival Time: 08:07 Had a fall or experienced change in No Transfer Assistance: None activities of daily living that may affect Patient Identification Verified: Yes risk of falls: Secondary Verification Process Completed: Yes Signs or symptoms of abuse/neglect since last visito No Patient Requires Transmission-Based Precautions: No Hospitalized since last visit: No Patient Has Alerts: No Implantable device outside of the clinic excluding No cellular tissue based products placed in the center since last visit: Has Dressing in Place as Prescribed: Yes Has Compression in Place as Prescribed: Yes Pain Present Now: No Electronic Signature(s) Signed: 05/05/2021 5:49:30 PM By: Lorrin Jackson Entered By: Lorrin Jackson on 05/05/2021 08:07:41 -------------------------------------------------------------------------------- Lower Extremity Assessment Details Patient Name: Date of Service: [name], Vaughan Basta D. 05/05/2021 8:15 A M Medical Record Number: [id number] Patient Account Number: [id number] Date of Birth/Sex: Treating RN: 07/20/1941 (80 y.o. Sue Lush Primary Care Tamaira Ciriello: Howard Pouch Other Clinician: Referring Marketta Valadez: Treating Theda Payer/Extender: [name], [name] Weeks in Treatment: 1 Edema Assessment Assessed: [Left: Yes] [Right: No] Edema: [Left: Ye] [Right:  s] Calf Left: Right: Point of Measurement: 27 cm From Medial Instep 31 cm Ankle Left: Right: Point of Measurement: 9 cm From Medial Instep 19.7 cm Vascular Assessment Pulses: Dorsalis Pedis Palpable: [Left:Yes] Electronic Signature(s) Signed: 05/05/2021 5:49:30 PM By: Lorrin Jackson Entered By: Lorrin Jackson on 05/05/2021 08:13:16 -------------------------------------------------------------------------------- Multi Wound Chart Details Patient Name: Date of Service: [name], Vaughan Basta D. 05/05/2021 8:15 A M Medical Record Number: [id number] Patient Account Number: [id number] Date of Birth/Sex: Treating RN: 11-09-1940 (80 y.o. Nancy Fetter Primary Care Macie Baum: Howard Pouch Other Clinician: Referring Suleika Donavan: Treating Naeem Quillin/Extender: [name], [name] Weeks in Treatment: 1 Vital Signs Height(in): 63 Pulse(bpm): 18 Weight(lbs): 104 Blood Pressure(mmHg): 139/68 Body Mass Index(BMI): 18 Temperature(F): 97.9 Respiratory Rate(breaths/min): 18 Photos: [1:No Photos Left, Anterior Lower Leg] [N/A:N/A N/A] Wound Location: [1:Trauma] [N/A:N/A] Wounding Event: [1:Trauma, Other] [N/A:N/A] Primary Etiology: [1:Cataracts, Anemia, Osteoarthritis] [N/A:N/A] Comorbid History: [1:02/28/2021] [N/A:N/A] Date Acquired: [1:1] [N/A:N/A] Weeks of Treatment: [1:Open] [N/A:N/A] Wound Status: [1:0.7x0.9x0.1] [N/A:N/A] Measurements L x W x D (cm) [1:0.495] [N/A:N/A] A (cm) : rea [1:0.049] [N/A:N/A] Volume (cm) : [1:-31.30%] [N/A:N/A] % Reduction in A rea: [1:34.70%] [N/A:N/A] % Reduction in Volume: [1:Full Thickness Without Exposed] [N/A:N/A] Classification: [1:Support Structures Medium] [N/A:N/A] Exudate A mount: [1:Serosanguineous] [N/A:N/A] Exudate Type: [1:red, brown] [N/A:N/A] Exudate Color: [1:Distinct, outline attached] [N/A:N/A] Wound Margin: [1:Large (67-100%)] [N/A:N/A] Granulation A mount: [1:Red] [N/A:N/A] Granulation Quality: [1:None Present (0%)]  [N/A:N/A] Necrotic A mount: [1:Fat Layer (Subcutaneous Tissue): Yes N/A] Exposed Structures: [1:Fascia: No Tendon: No Muscle: No Joint: No Bone: No None] [N/A:N/A] Epithelialization: [1:Debridement - Excisional] [N/A:N/A] Debridement: Pre-procedure Verification/Time Out 08:51 [N/A:N/A] Taken: [1:Lidocaine 5% topical ointment] [N/A:N/A] Pain Control: [1:Subcutaneous, Slough] [N/A:N/A] Tissue Debrided: [1:Skin/Subcutaneous Tissue] [N/A:N/A] Level: [1:0.63] [N/A:N/A] Debridement A (sq cm): [1:rea Curette] [N/A:N/A] Instrument: [1:Minimum] [N/A:N/A] Bleeding: [1:Pressure] [N/A:N/A] Hemostasis A chieved: [1:0] [N/A:N/A] Procedural Pain: [1:0] [N/A:N/A] Post Procedural Pain: [1:Procedure was tolerated well] [N/A:N/A] Debridement Treatment Response: [1:0.7x0.9x0.1] [N/A:N/A] Post Debridement Measurements L x W x D (cm) [1:0.049] [  N/A:N/A] Post Debridement Volume: (cm) [1:Debridement] [N/A:N/A] Procedures Performed: Treatment Notes Electronic Signature(s) Signed: 05/05/2021 1:01:25 PM By: Kalman Shan DO Signed: 05/09/2021 5:43:47 PM By: Levan Hurst RN, BSN Entered By: Kalman Shan on 05/05/2021 12:53:22 -------------------------------------------------------------------------------- Multi-Disciplinary Care Plan Details Patient Name: Date of Service: [name], Zariyah D. 05/05/2021 8:15 A M Medical Record Number: [id number] Patient Account Number: [id number] Date of Birth/Sex: Treating RN: May 05, 1941 (80 y.o. Nancy Fetter Primary Care Muskan Bolla: Howard Pouch Other Clinician: Referring Eriana Suliman: Treating Charlot Gouin/Extender: [name], [name] Weeks in Treatment: 1 Active Inactive Wound/Skin Impairment Nursing Diagnoses: Impaired tissue integrity Knowledge deficit related to ulceration/compromised skin integrity Goals: Patient/caregiver will verbalize understanding of skin care regimen Date Initiated: 04/27/2021 Target Resolution Date: 05/19/2021 Goal  Status: Active Ulcer/skin breakdown will have a volume reduction of 30% by week 4 Date Initiated: 04/27/2021 Target Resolution Date: 05/19/2021 Goal Status: Active Interventions: Assess patient/caregiver ability to obtain necessary supplies Assess patient/caregiver ability to perform ulcer/skin care regimen upon admission and as needed Assess ulceration(s) every visit Notes: Electronic Signature(s) Signed: 05/09/2021 5:43:47 PM By: Levan Hurst RN, BSN Entered By: Levan Hurst on 05/05/2021 08:11:06 -------------------------------------------------------------------------------- Pain Assessment Details Patient Name: Date of Service: Burnett Kanaris D. 05/05/2021 8:15 A M Medical Record Number: [id number] Patient Account Number: [id number] Date of Birth/Sex: Treating RN: October 14, 1941 (80 y.o. Sue Lush Primary Care Eduar Kumpf: Howard Pouch Other Clinician: Referring Chontel Warning: Treating Anaisabel Pederson/Extender: Otelia Santee Weeks in Treatment: 1 Active Problems Location of Pain Severity and Description of Pain Patient Has Paino No Patient Has Paino No Site Locations Pain Management and Medication Current Pain Management: Electronic Signature(s) Signed: 05/05/2021 5:49:30 PM By: Lorrin Jackson Entered By: Lorrin Jackson on 05/05/2021 08:07:50 -------------------------------------------------------------------------------- Patient/Caregiver Education Details Patient Name: Date of Service: Burnett Kanaris D. 7/8/2022andnbsp8:15 A M Medical Record Number: [id number] Patient Account Number: [id number] Date of Birth/Gender: Treating RN: [birth year] (80 y.o. Nancy Fetter Primary Care Physician: Howard Pouch Other Clinician: Referring Physician: Treating Physician/Extender: Berneice Gandy in Treatment: 1 Education Assessment Education Provided To: Patient Education Topics Provided Wound/Skin Impairment: Methods:  Explain/Verbal Responses: State content correctly Electronic Signature(s) Signed: 05/09/2021 5:43:47 PM By: Levan Hurst RN, BSN Entered By: Levan Hurst on 05/05/2021 08:11:16 -------------------------------------------------------------------------------- Wound Assessment Details Patient Name: Date of Service: Burnett Kanaris D. 05/05/2021 8:15 A M Medical Record Number: [id number] Patient Account Number: [id number] Date of Birth/Sex: Treating RN: 10-18-1941 (80 y.o. Sue Lush Primary Care Tovah Slavick: Howard Pouch Other Clinician: Referring Mirel Hundal: Treating Griffen Frayne/Extender: [name], [name] Weeks in Treatment: 1 Wound Status Wound Number: 1 Primary Etiology: Trauma, Other Wound Location: Left, Anterior Lower Leg Wound Status: Open Wounding Event: Trauma Comorbid History: Cataracts, Anemia, Osteoarthritis Date Acquired: 02/28/2021 Weeks Of Treatment: 1 Clustered Wound: No Photos Wound Measurements Length: (cm) 0.7 Width: (cm) 0.9 Depth: (cm) 0.1 Area: (cm) 0.495 Volume: (cm) 0.049 % Reduction in Area: -31.3% % Reduction in Volume: 34.7% Epithelialization: None Tunneling: No Undermining: No Wound Description Classification: Full Thickness Without Exposed Support Structures Wound Margin: Distinct, outline attached Exudate Amount: Medium Exudate Type: Serosanguineous Exudate Color: red, brown Foul Odor After Cleansing: No Slough/Fibrino No Wound Bed Granulation Amount: Large (67-100%) Exposed Structure Granulation Quality: Red Fascia Exposed: No Necrotic Amount: None Present (0%) Fat Layer (Subcutaneous Tissue) Exposed: Yes Tendon Exposed: No Muscle Exposed: No Joint Exposed: No Bone Exposed: No Electronic Signature(s) Signed: 05/05/2021 5:16:50 PM By: Sandre Kitty Signed: 05/05/2021 5:49:30 PM By: Lorrin Jackson Entered By: Sandre Kitty on 05/05/2021  16:56:05 --------------------------------------------------------------------------------  Vitals Details Patient Name: Date of Service: PAYGE, EPPES 05/05/2021 8:15 A M Medical Record Number: [id number] Patient Account Number: [id number] Date of Birth/Sex: Treating RN: [birth year] (80 y.o. Sue Lush Primary Care Anchor Dwan: Howard Pouch Other Clinician: Referring Sian Rockers: Treating Pernie Grosso/Extender: [name], [name] Weeks in Treatment: 1 Vital Signs Time Taken: 08:09 Temperature (F): 97.9 Height (in): 63 Pulse (bpm): 67 Weight (lbs): 104 Respiratory Rate (breaths/min): 18 Body Mass Index (BMI): 18.4 Blood Pressure (mmHg): 139/68 Reference Range: 80 - 120 mg / dl Electronic Signature(s) Signed: 05/05/2021 5:49:30 PM By: Lorrin Jackson Entered By: Lorrin Jackson on 05/05/2021 08:10:00

## 2021-05-09 NOTE — Progress Notes (Signed)
JERRIANN, SCHROM ([id number]) Visit Report for 05/05/2021 Chief Complaint Document Details Patient Name: Date of Service: Margaret Holmes, Margaret Holmes 05/05/2021 8:15 A M Medical Record Number: [id number] Patient Account Number: [id number] Date of Birth/Sex: Treating RN: Jun 15, 1941 (80 y.o. Nancy Fetter Primary Care Provider: Howard Pouch Other Clinician: Referring Provider: Treating Provider/Extender: Otelia Santee Weeks in Treatment: 1 Information Obtained from: Patient Chief Complaint Left lower extremity wound Electronic Signature(s) Signed: 05/05/2021 1:01:25 PM By: Kalman Shan DO Entered By: Kalman Shan on 05/05/2021 12:53:33 -------------------------------------------------------------------------------- Debridement Details Patient Name: Date of Service: Margaret Holmes, Kinzly D. 05/05/2021 8:15 A M Medical Record Number: [id number] Patient Account Number: [id number] Date of Birth/Sex: Treating RN: 1941/07/24 (80 y.o. Nancy Fetter Primary Care Provider: Howard Pouch Other Clinician: Referring Provider: Treating Provider/Extender: Otelia Santee Weeks in Treatment: 1 Debridement Performed for Assessment: Wound #1 Left,Anterior Lower Leg Performed By: Physician Kalman Shan, DO Debridement Type: Debridement Level of Consciousness (Pre-procedure): Awake and Alert Pre-procedure Verification/Time Out Yes - 08:51 Taken: Start Time: 08:51 Pain Control: Lidocaine 5% topical ointment T Area Debrided (L x W): otal 0.7 (cm) x 0.9 (cm) = 0.63 (cm) Tissue and other material debrided: Viable, Non-Viable, Slough, Subcutaneous, Slough Level: Skin/Subcutaneous Tissue Debridement Description: Excisional Instrument: Curette Bleeding: Minimum Hemostasis Achieved: Pressure End Time: 08:52 Procedural Pain: 0 Post Procedural Pain: 0 Response to Treatment: Procedure was tolerated well Level of Consciousness (Post- Awake and Alert procedure): Post  Debridement Measurements of Total Wound Length: (cm) 0.7 Width: (cm) 0.9 Depth: (cm) 0.1 Volume: (cm) 0.049 Character of Wound/Ulcer Post Debridement: Requires Further Debridement Post Procedure Diagnosis Same as Pre-procedure Electronic Signature(s) Signed: 05/05/2021 1:01:25 PM By: Kalman Shan DO Signed: 05/09/2021 5:43:47 PM By: Levan Hurst RN, BSN Entered By: Levan Hurst on 05/05/2021 08:51:51 -------------------------------------------------------------------------------- HPI Details Patient Name: Date of Service: Margaret Holmes, [place] D. 05/05/2021 8:15 A M Medical Record Number: [id number] Patient Account Number: [id number] Date of Birth/Sex: Treating RN: [birth year] (80 y.o. Nancy Fetter Primary Care Provider: Howard Pouch Other Clinician: Referring Provider: Treating Provider/Extender: Otelia Santee Weeks in Treatment: 1 History of Present Illness HPI Description: Admission 6/30 Ms. Despina Boan is a 80 year old female with a past medical history of hypothyroidism that presents to the clinic for left lower extremity wound. She states that 4 weeks ago she was cutting wood and a piece of wood hit her anterior shin. She has been using Epson salt baths, hydrogen peroxide and antibiotic ointment to the area. She does report being treated for cellulitis to this leg recently with doxycycline. She currently denies signs of infection. 7/8; patient presents for 1 week follow-up. She has tolerated the compression wrap well. She denies any issues and reports no signs of infection. Electronic Signature(s) Signed: 05/05/2021 1:01:25 PM By: Kalman Shan DO Entered By: Kalman Shan on 05/05/2021 12:53:58 -------------------------------------------------------------------------------- Physical Exam Details Patient Name: Date of Service: Margaret Holmes, Vaughan Basta D. 05/05/2021 8:15 A M Medical Record Number: [id number] Patient Account Number: [id number] Date of Birth/Sex:  Treating RN: [birth year] (79 y.o. Nancy Fetter Primary Care Provider: Howard Pouch Other Clinician: Referring Provider: Treating Provider/Extender: [name], [name] Weeks in Treatment: 1 Constitutional respirations regular, non-labored and within target range for patient.. Cardiovascular 2+ dorsalis pedis/posterior tibialis pulses. Psychiatric pleasant and cooperative. Notes Left lower extremity: Open wound to the anterior shin with nonviable tissue and granulation tissue present. No signs of infection Electronic Signature(s) Signed: 05/05/2021 1:01:25 PM By: Kalman Shan DO Entered By: Kalman Shan on 05/05/2021  12:54:35 -------------------------------------------------------------------------------- Physician Orders Details Patient Name: Date of Service: BREEZE, BERRINGER 05/05/2021 8:15 A M Medical Record Number: [id number] Patient Account Number: [id number] Date of Birth/Sex: Treating RN: 1941-08-05 (80 y.o. Nancy Fetter Primary Care Provider: Howard Pouch Other Clinician: Referring Provider: Treating Provider/Extender: Otelia Santee Weeks in Treatment: 1 Verbal / Phone Orders: No Diagnosis Coding ICD-10 Coding Code Description S81.802D Unspecified open wound, left lower leg, subsequent encounter I87.2 Venous insufficiency (chronic) (peripheral) E03.9 Hypothyroidism, unspecified Follow-up Appointments ppointment in 1 week. - with Dr. Heber Hunter Return A Bathing/ Shower/ Hygiene May shower with protection but do not get wound dressing(s) wet. Edema Control - Lymphedema / SCD / Other Elevate legs to the level of the heart or above for 30 minutes daily and/or when sitting, a frequency of: - throughout the day Avoid standing for long periods of time. Exercise regularly Wound Treatment Wound #1 - Lower Leg Wound Laterality: Left, Anterior Cleanser: Soap and Water 1 x Per Week/15 Days Discharge Instructions: May shower and  wash wound with dial antibacterial soap and water prior to dressing change. Prim Dressing: Hydrofera Blue Classic Foam, 2x2 in 1 x Per Week/15 Days ary Discharge Instructions: Moisten with saline prior to applying to wound bed Prim Dressing: Santyl Ointment 1 x Per Week/15 Days ary Discharge Instructions: Apply nickel thick amount to wound bed as instructed Secondary Dressing: Woven Gauze Sponge, Non-Sterile 4x4 in 1 x Per Week/15 Days Discharge Instructions: Apply over primary dressing as directed. Compression Wrap: Kerlix Roll 4.5x3.1 (in/yd) 1 x Per Week/15 Days Discharge Instructions: Apply Kerlix and Coban compression as directed. Compression Wrap: Coban Self-Adherent Wrap 4x5 (in/yd) 1 x Per Week/15 Days Discharge Instructions: Apply over Kerlix as directed. Electronic Signature(s) Signed: 05/05/2021 1:01:25 PM By: Kalman Shan DO Entered By: Kalman Shan on 05/05/2021 13:00:41 -------------------------------------------------------------------------------- Problem List Details Patient Name: Date of Service: Margaret Holmes, Janiya D. 05/05/2021 8:15 A M Medical Record Number: [id number] Patient Account Number: [id number] Date of Birth/Sex: Treating RN: 03-26-1941 (80 y.o. Nancy Fetter Primary Care Provider: Howard Pouch Other Clinician: Referring Provider: Treating Provider/Extender: Otelia Santee Weeks in Treatment: 1 Active Problems ICD-10 Encounter Code Description Active Date MDM Diagnosis S81.802D Unspecified open wound, left lower leg, subsequent encounter 05/05/2021 No Yes I87.2 Venous insufficiency (chronic) (peripheral) 04/27/2021 No Yes E03.9 Hypothyroidism, unspecified 04/27/2021 No Yes Inactive Problems Resolved Problems ICD-10 Code Description Active Date Resolved Date S81.802A Unspecified open wound, left lower leg, initial encounter 04/27/2021 04/27/2021 Electronic Signature(s) Signed: 05/05/2021 1:01:25 PM By: Kalman Shan DO Entered  By: Kalman Shan on 05/05/2021 13:00:15 -------------------------------------------------------------------------------- Progress Note Details Patient Name: Date of Service: Margaret Holmes, Trystyn D. 05/05/2021 8:15 A M Medical Record Number: [id number] Patient Account Number: [id number] Date of Birth/Sex: Treating RN: [birth year] (80 y.o. Nancy Fetter Primary Care Provider: Howard Pouch Other Clinician: Referring Provider: Treating Provider/Extender: Otelia Santee Weeks in Treatment: 1 Subjective Chief Complaint Information obtained from Patient Left lower extremity wound History of Present Illness (HPI) Admission 6/30 Ms. Datha Kissinger is a 80 year old female with a past medical history of hypothyroidism that presents to the clinic for left lower extremity wound. She states that 4 weeks ago she was cutting wood and a piece of wood hit her anterior shin. She has been using Epson salt baths, hydrogen peroxide and antibiotic ointment to the area. She does report being treated for cellulitis to this leg recently with doxycycline. She currently denies signs of infection. 7/8; patient presents for 1 week follow-up.  She has tolerated the compression wrap well. She denies any issues and reports no signs of infection. Patient History Unable to Obtain Patient History due to Altered Mental Status. Information obtained from Patient. Family History Cancer - Mother,Maternal Grandparents, Diabetes - Mother,Father, Heart Disease - Father, Hypertension - Father, No family history of Hereditary Spherocytosis, Kidney Disease, Lung Disease, Seizures, Stroke, Thyroid Problems, Tuberculosis. Social History Never smoker, Marital Status - Married, Alcohol Use - Never, Drug Use - No History, Caffeine Use - Daily - T Coffee. ea, Medical History Eyes Patient has history of Cataracts - Had surgery Hematologic/Lymphatic Patient has history of Anemia Musculoskeletal Patient has history  of Osteoarthritis Hospitalization/Surgery History - Band Hemorrhoidectomy. - Cataract Extraction w/ Implant 12/29/2. - Polypectomy 1478,2956. - DandC 04/2016. - Partial Thyroidectomy 1972. - Carpal Tunnel Surgery 28. Medical A Surgical History Notes nd Endocrine Hypothyroidism- partial thyroidectomy Objective Constitutional respirations regular, non-labored and within target range for patient.. Vitals Time Taken: 8:09 AM, Height: 63 in, Weight: 104 lbs, BMI: 18.4, Temperature: 97.9 F, Pulse: 67 bpm, Respiratory Rate: 18 breaths/min, Blood Pressure: 139/68 mmHg. Cardiovascular 2+ dorsalis pedis/posterior tibialis pulses. Psychiatric pleasant and cooperative. General Notes: Left lower extremity: Open wound to the anterior shin with nonviable tissue and granulation tissue present. No signs of infection Integumentary (Hair, Skin) Wound #1 status is Open. Original cause of wound was Trauma. The date acquired was: 02/28/2021. The wound has been in treatment 1 weeks. The wound is located on the Left,Anterior Lower Leg. The wound measures 0.7cm length x 0.9cm width x 0.1cm depth; 0.495cm^2 area and 0.049cm^3 volume. There is Fat Layer (Subcutaneous Tissue) exposed. There is no tunneling or undermining noted. There is a medium amount of serosanguineous drainage noted. The wound margin is distinct with the outline attached to the wound base. There is large (67-100%) red granulation within the wound bed. There is no necrotic tissue within the wound bed. Assessment Active Problems ICD-10 Unspecified open wound, left lower leg, initial encounter Venous insufficiency (chronic) (peripheral) Hypothyroidism, unspecified Patient's wound has shown improvement in size and appearance. I debrided nonviable tissue. No signs of infection. We will continue with Santyl Hydrofera Blue under compression therapy. Procedures Wound #1 Pre-procedure diagnosis of Wound #1 is a Trauma, Other located on the  Left,Anterior Lower Leg . There was a Excisional Skin/Subcutaneous Tissue Debridement with a total area of 0.63 sq cm performed by Kalman Shan, DO. With the following instrument(s): Curette to remove Viable and Non-Viable tissue/material. Material removed includes Subcutaneous Tissue and Slough and after achieving pain control using Lidocaine 5% topical ointment. No specimens were taken. A time out was conducted at 08:51, prior to the start of the procedure. A Minimum amount of bleeding was controlled with Pressure. The procedure was tolerated well with a pain level of 0 throughout and a pain level of 0 following the procedure. Post Debridement Measurements: 0.7cm length x 0.9cm width x 0.1cm depth; 0.049cm^3 volume. Character of Wound/Ulcer Post Debridement requires further debridement. Post procedure Diagnosis Wound #1: Same as Pre-Procedure Plan Follow-up Appointments: Return Appointment in 1 week. - with Dr. Heber August Bathing/ Shower/ Hygiene: May shower with protection but do not get wound dressing(s) wet. Edema Control - Lymphedema / SCD / Other: Elevate legs to the level of the heart or above for 30 minutes daily and/or when sitting, a frequency of: - throughout the day Avoid standing for long periods of time. Exercise regularly WOUND #1: - Lower Leg Wound Laterality: Left, Anterior Cleanser: Soap and Water 1 x Per Week/15  Days Discharge Instructions: May shower and wash wound with dial antibacterial soap and water prior to dressing change. Prim Dressing: Hydrofera Blue Classic Foam, 2x2 in 1 x Per Week/15 Days ary Discharge Instructions: Moisten with saline prior to applying to wound bed Prim Dressing: Santyl Ointment 1 x Per Week/15 Days ary Discharge Instructions: Apply nickel thick amount to wound bed as instructed Secondary Dressing: Woven Gauze Sponge, Non-Sterile 4x4 in 1 x Per Week/15 Days Discharge Instructions: Apply over primary dressing as directed. Com pression  Wrap: Kerlix Roll 4.5x3.1 (in/yd) 1 x Per Week/15 Days Discharge Instructions: Apply Kerlix and Coban compression as directed. Com pression Wrap: Coban Self-Adherent Wrap 4x5 (in/yd) 1 x Per Week/15 Days Discharge Instructions: Apply over Kerlix as directed. 1. In office sharp debridement 2. Hydrofera Blue/Santyl under Kerlix/Coban 3. Follow-up in 1 week Electronic Signature(s) Signed: 05/05/2021 1:01:25 PM By: Kalman Shan DO Entered By: Kalman Shan on 05/05/2021 12:56:08 -------------------------------------------------------------------------------- HxROS Details Patient Name: Date of Service: Margaret Holmes, Gena D. 05/05/2021 8:15 A M Medical Record Number: [id number] Patient Account Number: [id number] Date of Birth/Sex: Treating RN: September 12, 1941 (80 y.o. Nancy Fetter Primary Care Provider: Howard Pouch Other Clinician: Referring Provider: Treating Provider/Extender: [name], [name] Weeks in Treatment: 1 Unable to Obtain Patient History due to Altered Mental Status Information Obtained From Patient Eyes Medical History: Positive for: Cataracts - Had surgery Hematologic/Lymphatic Medical History: Positive for: Anemia Endocrine Medical History: Past Medical History Notes: Hypothyroidism- partial thyroidectomy Musculoskeletal Medical History: Positive for: Osteoarthritis HBO Extended History Items Eyes: Cataracts Immunizations Pneumococcal Vaccine: Received Pneumococcal Vaccination: Yes Implantable Devices None Hospitalization / Surgery History Type of Hospitalization/Surgery Band Hemorrhoidectomy Cataract Extraction w/ Implant 12/29/2 Polypectomy 2013,2016 DandC 04/2016 Partial Thyroidectomy 1972 Carpal Tunnel Surgery 28 Family and Social History Cancer: Yes - Mother,Maternal Grandparents; Diabetes: Yes - Mother,Father; Heart Disease: Yes - Father; Hereditary Spherocytosis: No; Hypertension: Yes - Father; Kidney Disease: No; Lung  Disease: No; Seizures: No; Stroke: No; Thyroid Problems: No; Tuberculosis: No; Never smoker; Marital Status - Married; Alcohol Use: Never; Drug Use: No History; Caffeine Use: Daily - T Coffee; Financial Concerns: No; Food, Clothing or Shelter Needs: No; Support ea, System Lacking: No; Transportation Concerns: No Electronic Signature(s) Signed: 05/05/2021 1:01:25 PM By: Kalman Shan DO Signed: 05/09/2021 5:43:47 PM By: Levan Hurst RN, BSN Entered By: Kalman Shan on 05/05/2021 12:54:04 -------------------------------------------------------------------------------- [place] Details Patient Name: Date of Service: Margaret Holmes, [place] D. 05/05/2021 Medical Record Number: [id number] Patient Account Number: [id number] Date of Birth/Sex: Treating RN: 13-Jun-1941 (80 y.o. Nancy Fetter Primary Care Provider: Howard Pouch Other Clinician: Referring Provider: Treating Provider/Extender: [name], [name] Weeks in Treatment: 1 Diagnosis Coding ICD-10 Codes Code Description [phone] Unspecified open wound, left lower leg, subsequent encounter I87.2 Venous insufficiency (chronic) (peripheral) E03.9 Hypothyroidism, unspecified Facility Procedures CPT4 Code: [id number] Description: 81448 - DEB SUBQ TISSUE 20 SQ CM/< ICD-10 Diagnosis Description S81.802D Unspecified open wound, left lower leg, subsequent encounter I87.2 Venous insufficiency (chronic) (peripheral) E03.9 Hypothyroidism, unspecified Modifier: Quantity: 1 Physician Procedures : CPT4 Code Description Modifier [id number] - WC PHYS SUBQ TISS 20 SQ CM ICD-10 Diagnosis Description S81.802D Unspecified open wound, left lower leg, subsequent encounter I87.2 Venous insufficiency (chronic) (peripheral) E03.9 Hypothyroidism,  unspecified Quantity: 1 Electronic Signature(s) Signed: 05/05/2021 1:01:25 PM By: Kalman Shan DO Entered By: Kalman Shan on 05/05/2021 13:00:58

## 2021-05-12 ENCOUNTER — Encounter (HOSPITAL_BASED_OUTPATIENT_CLINIC_OR_DEPARTMENT_OTHER): Payer: Medicare Other | Admitting: Internal Medicine

## 2021-05-12 ENCOUNTER — Other Ambulatory Visit: Payer: Self-pay

## 2021-05-12 DIAGNOSIS — Z833 Family history of diabetes mellitus: Secondary | ICD-10-CM | POA: Diagnosis not present

## 2021-05-12 DIAGNOSIS — S81802D Unspecified open wound, left lower leg, subsequent encounter: Secondary | ICD-10-CM

## 2021-05-12 DIAGNOSIS — I890 Lymphedema, not elsewhere classified: Secondary | ICD-10-CM | POA: Diagnosis not present

## 2021-05-12 DIAGNOSIS — L98499 Non-pressure chronic ulcer of skin of other sites with unspecified severity: Secondary | ICD-10-CM | POA: Diagnosis not present

## 2021-05-12 DIAGNOSIS — I872 Venous insufficiency (chronic) (peripheral): Secondary | ICD-10-CM | POA: Diagnosis not present

## 2021-05-12 NOTE — Progress Notes (Signed)
Margaret Holmes ([id number]) Visit Report for 05/12/2021 Chief Complaint Document Details Patient Name: Date of Service: Margaret Holmes 05/12/2021 8:00 A M Medical Record Number: [id number] Patient Account Number: [id number] Date of Birth/Sex: Treating RN: 10/05/1941 (80 y.o. Elam Dutch Primary Care Provider: Howard Pouch Other Clinician: Referring Provider: Treating Provider/Extender: Otelia Santee Weeks in Treatment: 2 Information Obtained from: Patient Chief Complaint Left lower extremity wound Electronic Signature(s) Signed: 05/12/2021 10:04:40 AM By: Kalman Shan DO Entered By: Kalman Shan on 05/12/2021 10:00:02 -------------------------------------------------------------------------------- Debridement Details Patient Name: Date of Service: Margaret Holiday, [name] 05/12/2021 8:00 A M Medical Record Number: [id number] Patient Account Number: [id number] Date of Birth/Sex: Treating RN: [birth year] (81 y.o. Elam Dutch Primary Care Provider: Howard Pouch Other Clinician: Referring Provider: Treating Provider/Extender: Otelia Santee Weeks in Treatment: 2 Debridement Performed for Assessment: Wound #1 Left,Anterior Lower Leg Performed By: Physician Kalman Shan, DO Debridement Type: Debridement Level of Consciousness (Pre-procedure): Awake and Alert Pre-procedure Verification/Time Out Yes - 08:30 Taken: Start Time: 08:31 Pain Control: Other : benzocaine 20% spray T Area Debrided (L x W): otal 0.4 (cm) x 0.6 (cm) = 0.24 (cm) Tissue and other material debrided: Viable, Non-Viable, Slough, Subcutaneous, Fibrin/Exudate, Slough Level: Skin/Subcutaneous Tissue Debridement Description: Excisional Instrument: Curette Bleeding: Minimum Hemostasis Achieved: Pressure End Time: 08:34 Procedural Pain: 2 Post Procedural Pain: 1 Response to Treatment: Procedure was tolerated well Level of Consciousness (Post- Awake and  Alert procedure): Post Debridement Measurements of Total Wound Length: (cm) 0.4 Width: (cm) 0.6 Depth: (cm) 0.1 Volume: (cm) 0.019 Character of Wound/Ulcer Post Debridement: Improved Post Procedure Diagnosis Same as Pre-procedure Electronic Signature(s) Signed: 05/12/2021 10:04:40 AM By: Kalman Shan DO Signed: 05/12/2021 12:39:56 PM By: Baruch Gouty RN, BSN Entered By: Baruch Gouty on 05/12/2021 08:33:07 -------------------------------------------------------------------------------- HPI Details Patient Name: Date of Service: Margaret Holiday, [name] 05/12/2021 8:00 A M Medical Record Number: [id number] Patient Account Number: [id number] Date of Birth/Sex: Treating RN: Mar 15, 1941 (80 y.o. Elam Dutch Primary Care Provider: Howard Pouch Other Clinician: Referring Provider: Treating Provider/Extender: Otelia Santee Weeks in Treatment: 2 History of Present Illness HPI Description: Admission 6/30 Margaret Holmes is a 80 year old female with a past medical history of hypothyroidism that presents to the clinic for left lower extremity wound. She states that 4 weeks ago she was cutting wood and a piece of wood hit her anterior shin. She has been using Epson salt baths, hydrogen peroxide and antibiotic ointment to the area. She does report being treated for cellulitis to this leg recently with doxycycline. She currently denies signs of infection. 7/8; patient presents for 1 week follow-up. She has tolerated the compression wrap well. She denies any issues and reports no signs of infection. 7/15; patient presents for 1 week follow-up. She continues to tolerate the compression wrap well. She has no issues or complaints today. She does denies signs of infection. Electronic Signature(s) Signed: 05/12/2021 10:04:40 AM By: Kalman Shan DO Entered By: Kalman Shan on 05/12/2021  10:00:24 -------------------------------------------------------------------------------- Physical Exam Details Patient Name: Date of Service: Margaret Holiday, [name] 05/12/2021 8:00 A M Medical Record Number: [id number] Patient Account Number: [id number] Date of Birth/Sex: Treating RN: May 22, 1941 (80 y.o. Elam Dutch Primary Care Provider: Howard Pouch Other Clinician: Referring Provider: Treating Provider/Extender: [name], [name] Weeks in Treatment: 2 Constitutional respirations regular, non-labored and within target range for patient.Marland Kitchen Psychiatric pleasant and cooperative. Notes Left lower extremity: Open wound to the anterior shin with nonviable tissue and granulation  tissue present. No signs of infection Electronic Signature(s) Signed: 05/12/2021 10:04:40 AM By: Kalman Shan DO Entered By: Kalman Shan on 05/12/2021 10:01:30 -------------------------------------------------------------------------------- Physician Orders Details Patient Name: Date of Service: Margaret Holiday, [name] 05/12/2021 8:00 A M Medical Record Number: [id number] Patient Account Number: [id number] Date of Birth/Sex: Treating RN: Aug 21, 1941 (80 y.o. Elam Dutch Primary Care Provider: Howard Pouch Other Clinician: Referring Provider: Treating Provider/Extender: Otelia Santee Weeks in Treatment: 2 Verbal / Phone Orders: No Diagnosis Coding ICD-10 Coding Code Description [phone] Unspecified open wound, left lower leg, subsequent encounter I87.2 Venous insufficiency (chronic) (peripheral) E03.9 Hypothyroidism, unspecified Follow-up Appointments ppointment in 1 week. - with Dr. Heber Penn Return A Bathing/ Shower/ Hygiene May shower with protection but do not get wound dressing(s) wet. Edema Control - Lymphedema / SCD / Other Elevate legs to the level of the heart or above for 30 minutes daily and/or when sitting, a frequency of: - throughout the  day Avoid standing for long periods of time. Exercise regularly Wound Treatment Wound #1 - Lower Leg Wound Laterality: Left, Anterior Cleanser: Soap and Water 1 x Per Week/15 Days Discharge Instructions: May shower and wash wound with dial antibacterial soap and water prior to dressing change. Prim Dressing: Hydrofera Blue Classic Foam, 2x2 in 1 x Per Week/15 Days ary Discharge Instructions: Moisten with saline prior to applying to wound bed Prim Dressing: Santyl Ointment 1 x Per Week/15 Days ary Discharge Instructions: Apply nickel thick amount to wound bed as instructed Secondary Dressing: Woven Gauze Sponge, Non-Sterile 4x4 in 1 x Per Week/15 Days Discharge Instructions: Apply over primary dressing as directed. Compression Wrap: Kerlix Roll 4.5x3.1 (in/yd) 1 x Per Week/15 Days Discharge Instructions: Apply Kerlix and Coban compression as directed. Compression Wrap: Coban Self-Adherent Wrap 4x5 (in/yd) 1 x Per Week/15 Days Discharge Instructions: Apply over Kerlix as directed. Electronic Signature(s) Signed: 05/12/2021 10:04:40 AM By: Kalman Shan DO Entered By: Kalman Shan on 05/12/2021 10:01:56 -------------------------------------------------------------------------------- Problem List Details Patient Name: Date of Service: Margaret Holiday, Fronie D. 05/12/2021 8:00 A M Medical Record Number: [id number] Patient Account Number: [id number] Date of Birth/Sex: Treating RN: 1941-03-11 (80 y.o. Elam Dutch Primary Care Provider: Howard Pouch Other Clinician: Referring Provider: Treating Provider/Extender: Otelia Santee Weeks in Treatment: 2 Active Problems ICD-10 Encounter Code Description Active Date MDM Diagnosis S81.802D Unspecified open wound, left lower leg, subsequent encounter 05/05/2021 No Yes I87.2 Venous insufficiency (chronic) (peripheral) 04/27/2021 No Yes E03.9 Hypothyroidism, unspecified 04/27/2021 No Yes Inactive Problems Resolved  Problems ICD-10 Code Description Active Date Resolved Date S81.802A Unspecified open wound, left lower leg, initial encounter 04/27/2021 04/27/2021 Electronic Signature(s) Signed: 05/12/2021 10:04:40 AM By: Kalman Shan DO Entered By: Kalman Shan on 05/12/2021 09:59:38 -------------------------------------------------------------------------------- Progress Note Details Patient Name: Date of Service: Margaret Holiday, Aryn D. 05/12/2021 8:00 A M Medical Record Number: [id number] Patient Account Number: [id number] Date of Birth/Sex: Treating RN: 1940/10/31 (80 y.o. Elam Dutch Primary Care Provider: Howard Pouch Other Clinician: Referring Provider: Treating Provider/Extender: Otelia Santee Weeks in Treatment: 2 Subjective Chief Complaint Information obtained from Patient Left lower extremity wound History of Present Illness (HPI) Admission 6/30 Ms. Renae Mottley is a 80 year old female with a past medical history of hypothyroidism that presents to the clinic for left lower extremity wound. She states that 4 weeks ago she was cutting wood and a piece of wood hit her anterior shin. She has been using Epson salt baths, hydrogen peroxide and antibiotic ointment to the area. She does report being  treated for cellulitis to this leg recently with doxycycline. She currently denies signs of infection. 7/8; patient presents for 1 week follow-up. She has tolerated the compression wrap well. She denies any issues and reports no signs of infection. 7/15; patient presents for 1 week follow-up. She continues to tolerate the compression wrap well. She has no issues or complaints today. She does denies signs of infection. Patient History Unable to Obtain Patient History due to Altered Mental Status. Information obtained from Patient. Family History Cancer - Mother,Maternal Grandparents, Diabetes - Mother,Father, Heart Disease - Father, Hypertension - Father, No family  history of Hereditary Spherocytosis, Kidney Disease, Lung Disease, Seizures, Stroke, Thyroid Problems, Tuberculosis. Social History Never smoker, Marital Status - Married, Alcohol Use - Never, Drug Use - No History, Caffeine Use - Daily - T Coffee. ea, Medical History Eyes Patient has history of Cataracts - Had surgery Hematologic/Lymphatic Patient has history of Anemia Musculoskeletal Patient has history of Osteoarthritis Hospitalization/Surgery History - Band Hemorrhoidectomy. - Cataract Extraction w/ Implant 12/29/2. - Polypectomy 2641,5830. - DandC 04/2016. - Partial Thyroidectomy 1972. - Carpal Tunnel Surgery 28. Medical A Surgical History Notes nd Endocrine Hypothyroidism- partial thyroidectomy Objective Constitutional respirations regular, non-labored and within target range for patient.. Vitals Time Taken: 7:51 AM, Height: 63 in, Weight: 104 lbs, BMI: 18.4, Temperature: 97.9 F, Pulse: 62 bpm, Respiratory Rate: 16 breaths/min, Blood Pressure: 113/64 mmHg. Psychiatric pleasant and cooperative. General Notes: Left lower extremity: Open wound to the anterior shin with nonviable tissue and granulation tissue present. No signs of infection Integumentary (Hair, Skin) Wound #1 status is Open. Original cause of wound was Trauma. The date acquired was: 02/28/2021. The wound has been in treatment 2 weeks. The wound is located on the Left,Anterior Lower Leg. The wound measures 0.4cm length x 0.6cm width x 0.1cm depth; 0.188cm^2 area and 0.019cm^3 volume. There is Fat Layer (Subcutaneous Tissue) exposed. There is no tunneling or undermining noted. There is a small amount of serosanguineous drainage noted. The wound margin is distinct with the outline attached to the wound base. There is large (67-100%) pink granulation within the wound bed. There is no necrotic tissue within the wound bed. Assessment Active Problems ICD-10 Unspecified open wound, left lower leg, subsequent  encounter Venous insufficiency (chronic) (peripheral) Hypothyroidism, unspecified Patient's wound continues to show improvement in size and appearance. I debrided nonviable tissue. I would like to continue with Santyl and Hydrofera Blue under Kerlix/Coban. Patient was agreeable. I do not think she will need the wrap for more than 1 more week. If there is still a wound I think she could change The dressing daily. Procedures Wound #1 Pre-procedure diagnosis of Wound #1 is a Trauma, Other located on the Left,Anterior Lower Leg . There was a Excisional Skin/Subcutaneous Tissue Debridement with a total area of 0.24 sq cm performed by Kalman Shan, DO. With the following instrument(s): Curette to remove Viable and Non-Viable tissue/material. Material removed includes Subcutaneous Tissue, Slough, and Fibrin/Exudate after achieving pain control using Other (benzocaine 20% spray). No specimens were taken. A time out was conducted at 08:30, prior to the start of the procedure. A Minimum amount of bleeding was controlled with Pressure. The procedure was tolerated well with a pain level of 2 throughout and a pain level of 1 following the procedure. Post Debridement Measurements: 0.4cm length x 0.6cm width x 0.1cm depth; 0.019cm^3 volume. Character of Wound/Ulcer Post Debridement is improved. Post procedure Diagnosis Wound #1: Same as Pre-Procedure Plan Follow-up Appointments: Return Appointment in 1 week. - with  Dr. Heber [place] Bathing/ Shower/ Hygiene: May shower with protection but do not get wound dressing(s) wet. Edema Control - Lymphedema / SCD / Other: Elevate legs to the level of the heart or above for 30 minutes daily and/or when sitting, a frequency of: - throughout the day Avoid standing for long periods of time. Exercise regularly WOUND #1: - Lower Leg Wound Laterality: Left, Anterior Cleanser: Soap and Water 1 x Per Week/15 Days Discharge Instructions: May shower and wash wound with dial  antibacterial soap and water prior to dressing change. Prim Dressing: Hydrofera Blue Classic Foam, 2x2 in 1 x Per Week/15 Days ary Discharge Instructions: Moisten with saline prior to applying to wound bed Prim Dressing: Santyl Ointment 1 x Per Week/15 Days ary Discharge Instructions: Apply nickel thick amount to wound bed as instructed Secondary Dressing: Woven Gauze Sponge, Non-Sterile 4x4 in 1 x Per Week/15 Days Discharge Instructions: Apply over primary dressing as directed. Com pression Wrap: Kerlix Roll 4.5x3.1 (in/yd) 1 x Per Week/15 Days Discharge Instructions: Apply Kerlix and Coban compression as directed. Com pression Wrap: Coban Self-Adherent Wrap 4x5 (in/yd) 1 x Per Week/15 Days Discharge Instructions: Apply over Kerlix as directed. 1. In office sharp debridement 2. Santyl and Hydrofera Blue under Kerlix/Coban 3. Follow-up in 1 week Electronic Signature(s) Signed: 05/12/2021 10:04:40 AM By: Kalman Shan DO Entered By: Kalman Shan on 05/12/2021 10:04:03 -------------------------------------------------------------------------------- HxROS Details Patient Name: Date of Service: Margaret Holiday, Desaray D. 05/12/2021 8:00 A M Medical Record Number: [id number] Patient Account Number: [id number] Date of Birth/Sex: Treating RN: September 06, 1941 (80 y.o. Elam Dutch Primary Care Provider: Howard Pouch Other Clinician: Referring Provider: Treating Provider/Extender: Otelia Santee Weeks in Treatment: 2 Unable to Obtain Patient History due to Altered Mental Status Information Obtained From Patient Eyes Medical History: Positive for: Cataracts - Had surgery Hematologic/Lymphatic Medical History: Positive for: Anemia Endocrine Medical History: Past Medical History Notes: Hypothyroidism- partial thyroidectomy Musculoskeletal Medical History: Positive for: Osteoarthritis HBO Extended History Items Eyes: Cataracts Immunizations Pneumococcal  Vaccine: Received Pneumococcal Vaccination: Yes Implantable Devices None Hospitalization / Surgery History Type of Hospitalization/Surgery Band Hemorrhoidectomy Cataract Extraction w/ Implant 12/29/2 Polypectomy 2013,2016 DandC 04/2016 Partial Thyroidectomy 1972 Carpal Tunnel Surgery 28 Family and Social History Cancer: Yes - Mother,Maternal Grandparents; Diabetes: Yes - Mother,Father; Heart Disease: Yes - Father; Hereditary Spherocytosis: No; Hypertension: Yes - Father; Kidney Disease: No; Lung Disease: No; Seizures: No; Stroke: No; Thyroid Problems: No; Tuberculosis: No; Never smoker; Marital Status - Married; Alcohol Use: Never; Drug Use: No History; Caffeine Use: Daily - T Coffee; Financial Concerns: No; Food, Clothing or Shelter Needs: No; Support ea, System Lacking: No; Transportation Concerns: No Electronic Signature(s) Signed: 05/12/2021 10:04:40 AM By: Kalman Shan DO Signed: 05/12/2021 12:39:56 PM By: Baruch Gouty RN, BSN Entered By: Kalman Shan on 05/12/2021 10:00:31 -------------------------------------------------------------------------------- [place] Details Patient Name: Date of Service: Margaret Holiday, Katrisha D. 05/12/2021 Medical Record Number: [id number] Patient Account Number: [id number] Date of Birth/Sex: Treating RN: 22-Feb-1941 (80 y.o. Elam Dutch Primary Care Provider: Howard Pouch Other Clinician: Referring Provider: Treating Provider/Extender: Otelia Santee Weeks in Treatment: 2 Diagnosis Coding ICD-10 Codes Code Description [phone] Unspecified open wound, left lower leg, subsequent encounter I87.2 Venous insufficiency (chronic) (peripheral) E03.9 Hypothyroidism, unspecified Facility Procedures CPT4 Code: [id number] Description: 41740 - DEB SUBQ TISSUE 20 SQ CM/< ICD-10 Diagnosis Description S81.802D Unspecified open wound, left lower leg, subsequent encounter Modifier: Quantity: 1 Physician Procedures Electronic  Signature(s) Signed: 05/12/2021 10:04:40 AM By: Kalman Shan DO Entered By: Kalman Shan on 05/12/2021  10:04:09 

## 2021-05-15 NOTE — Progress Notes (Signed)
SHANETRA, BLUMENSTOCK ([id number]) Visit Report for 05/12/2021 Arrival Information Details Patient Name: Date of Service: Margaret Holmes, Margaret Holmes 05/12/2021 8:00 A M Medical Record Number: [id number] Patient Account Number: [id number] Date of Birth/Sex: Treating RN: [birth year] (80 y.o. Nancy Fetter Primary Care Jerlyn Pain: Howard Pouch Other Clinician: Referring Aldea Avis: Treating Jep Dyas/Extender: Otelia Santee Weeks in Treatment: 2 Visit Information History Since Last Visit Added or deleted any medications: No Patient Arrived: Ambulatory Any new allergies or adverse reactions: No Arrival Time: 07:51 Had a fall or experienced change in No Accompanied By: alone activities of daily living that may affect Transfer Assistance: None risk of falls: Patient Identification Verified: Yes Signs or symptoms of abuse/neglect since last visito No Secondary Verification Process Completed: Yes Hospitalized since last visit: No Patient Requires Transmission-Based Precautions: No Implantable device outside of the clinic excluding No Patient Has Alerts: No cellular tissue based products placed in the center since last visit: Has Dressing in Place as Prescribed: Yes Has Compression in Place as Prescribed: Yes Pain Present Now: No Electronic Signature(s) Signed: 05/15/2021 5:04:52 PM By: Levan Hurst RN, BSN Entered By: Levan Hurst on 05/12/2021 07:52:11 -------------------------------------------------------------------------------- Encounter Discharge Information Details Patient Name: Date of Service: Margaret Holmes, Margaret D. 05/12/2021 8:00 A M Medical Record Number: [id number] Patient Account Number: [id number] Date of Birth/Sex: Treating RN: 03-06-1941 (80 y.o. Nancy Fetter Primary Care Kashia Brossard: Howard Pouch Other Clinician: Referring Shalee Paolo: Treating Lamonta Cypress/Extender: Otelia Santee Weeks in Treatment: 2 Encounter Discharge Information Items Post  Procedure Vitals Discharge Condition: Stable Temperature (F): 97.9 Ambulatory Status: Ambulatory Pulse (bpm): 62 Discharge Destination: Home Respiratory Rate (breaths/min): 16 Transportation: Private Auto Blood Pressure (mmHg): 113/64 Accompanied By: alone Schedule Follow-up Appointment: Yes Clinical Summary of Care: Patient Declined Electronic Signature(s) Signed: 05/15/2021 5:04:52 PM By: Levan Hurst RN, BSN Entered By: Levan Hurst on 05/12/2021 08:51:41 -------------------------------------------------------------------------------- Lower Extremity Assessment Details Patient Name: Date of Service: Margaret Holmes, Margaret D. 05/12/2021 8:00 A M Medical Record Number: [id number] Patient Account Number: [id number] Date of Birth/Sex: Treating RN: Oct 06, 1941 (80 y.o. Nancy Fetter Primary Care Keirstan Iannello: Howard Pouch Other Clinician: Referring Toyia Jelinek: Treating Mikhala Kenan/Extender: [name], [name] Weeks in Treatment: 2 Edema Assessment Assessed: [Left: No] [Right: No] Edema: [Left: Ye] [Right: s] Calf Left: Right: Point of Measurement: 27 cm From Medial Instep 31.5 cm Ankle Left: Right: Point of Measurement: 9 cm From Medial Instep 20 cm Vascular Assessment Pulses: Dorsalis Pedis Palpable: [Left:Yes] Electronic Signature(s) Signed: 05/15/2021 5:04:52 PM By: Levan Hurst RN, BSN Entered By: Levan Hurst on 05/12/2021 08:00:32 -------------------------------------------------------------------------------- Multi Wound Chart Details Patient Name: Date of Service: Margaret Holmes, Margaret D. 05/12/2021 8:00 A M Medical Record Number: [id number] Patient Account Number: [id number] Date of Birth/Sex: Treating RN: 31-Jan-1941 (80 y.o. Elam Dutch Primary Care Ileah Falkenstein: Howard Pouch Other Clinician: Referring Casandra Dallaire: Treating Trenda Corliss/Extender: [name], [name] Weeks in Treatment: 2 Vital Signs Height(in): 63 Pulse(bpm): 50 Weight(lbs):  104 Blood Pressure(mmHg): 113/64 Body Mass Index(BMI): 18 Temperature(F): 97.9 Respiratory Rate(breaths/min): 16 Photos: [1:No Photos Left, Anterior Lower Leg] [N/A:N/A N/A] Wound Location: [1:Trauma] [N/A:N/A] Wounding Event: [1:Trauma, Other] [N/A:N/A] Primary Etiology: [1:Cataracts, Anemia, Osteoarthritis] [N/A:N/A] Comorbid History: [1:02/28/2021] [N/A:N/A] Date Acquired: [1:2] [N/A:N/A] Weeks of Treatment: [1:Open] [N/A:N/A] Wound Status: [1:0.4x0.6x0.1] [N/A:N/A] Measurements L x W x D (cm) [1:0.188] [N/A:N/A] A (cm) : rea [1:0.019] [N/A:N/A] Volume (cm) : [1:50.10%] [N/A:N/A] % Reduction in A rea: [1:74.70%] [N/A:N/A] % Reduction in Volume: [1:Full Thickness Without Exposed] [N/A:N/A] Classification: [1:Support Structures Small] [N/A:N/A] Exudate A mount: [  1:Serosanguineous] [N/A:N/A] Exudate Type: [1:red, brown] [N/A:N/A] Exudate Color: [1:Distinct, outline attached] [N/A:N/A] Wound Margin: [1:Large (67-100%)] [N/A:N/A] Granulation A mount: [1:Pink] [N/A:N/A] Granulation Quality: [1:None Present (0%)] [N/A:N/A] Necrotic A mount: [1:Fat Layer (Subcutaneous Tissue): Yes N/A] Exposed Structures: [1:Fascia: No Tendon: No Muscle: No Joint: No Bone: No Large (67-100%)] [N/A:N/A] Epithelialization: [1:Debridement - Excisional] [N/A:N/A] Debridement: Pre-procedure Verification/Time Out 08:30 [N/A:N/A] Taken: [1:Other] [N/A:N/A] Pain Control: [1:Subcutaneous, Slough] [N/A:N/A] Tissue Debrided: [1:Skin/Subcutaneous Tissue] [N/A:N/A] Level: [1:0.24] [N/A:N/A] Debridement A (sq cm): [1:rea Curette] [N/A:N/A] Instrument: [1:Minimum] [N/A:N/A] Bleeding: [1:Pressure] [N/A:N/A] Hemostasis A chieved: [1:2] [N/A:N/A] Procedural Pain: [1:1] [N/A:N/A] Post Procedural Pain: [1:Procedure was tolerated well] [N/A:N/A] Debridement Treatment Response: [1:0.4x0.6x0.1] [N/A:N/A] Post Debridement Measurements L x W x D (cm) [1:0.019] [N/A:N/A] Post Debridement Volume: (cm)  [1:Debridement] [N/A:N/A] Treatment Notes Wound #1 (Lower Leg) Wound Laterality: Left, Anterior Cleanser Soap and Water Discharge Instruction: May shower and wash wound with dial antibacterial soap and water prior to dressing change. Peri-Wound Care Topical Primary Dressing Hydrofera Blue Classic Foam, 2x2 in Discharge Instruction: Moisten with saline prior to applying to wound bed Santyl Ointment Discharge Instruction: Apply nickel thick amount to wound bed as instructed Secondary Dressing Woven Gauze Sponge, Non-Sterile 4x4 in Discharge Instruction: Apply over primary dressing as directed. Secured With Compression Wrap Kerlix Roll 4.5x3.1 (in/yd) Discharge Instruction: Apply Kerlix and Coban compression as directed. Coban Self-Adherent Wrap 4x5 (in/yd) Discharge Instruction: Apply over Kerlix as directed. Compression Stockings Add-Ons Electronic Signature(s) Signed: 05/12/2021 10:04:40 AM By: Kalman Shan DO Signed: 05/12/2021 12:39:56 PM By: Baruch Gouty RN, BSN Entered By: Kalman Shan on 05/12/2021 09:59:56 -------------------------------------------------------------------------------- Multi-Disciplinary Care Plan Details Patient Name: Date of Service: Margaret Holmes, Margaret D. 05/12/2021 8:00 A M Medical Record Number: [id number] Patient Account Number: [id number] Date of Birth/Sex: Treating RN: 1941-01-18 (80 y.o. Elam Dutch Primary Care Darinda [place]: Howard Pouch Other Clinician: Referring Logun Colavito: Treating Tihanna Goodson/Extender: [name], [name] Weeks in Treatment: 2 Active Inactive Wound/Skin Impairment Nursing Diagnoses: Impaired tissue integrity Knowledge deficit related to ulceration/compromised skin integrity Goals: Patient/caregiver will verbalize understanding of skin care regimen Date Initiated: 04/27/2021 Target Resolution Date: 05/19/2021 Goal Status: Active Ulcer/skin breakdown will have a volume reduction of 30% by week 4 Date  Initiated: 04/27/2021 Target Resolution Date: 05/19/2021 Goal Status: Active Interventions: Assess patient/caregiver ability to obtain necessary supplies Assess patient/caregiver ability to perform ulcer/skin care regimen upon admission and as needed Assess ulceration(s) every visit Notes: Electronic Signature(s) Signed: 05/12/2021 12:39:56 PM By: Baruch Gouty RN, BSN Entered By: Baruch Gouty on 05/12/2021 08:33:17 -------------------------------------------------------------------------------- Pain Assessment Details Patient Name: Date of Service: Margaret Holmes, Margaret Basta D. 05/12/2021 8:00 A M Medical Record Number: [id number] Patient Account Number: [id number] Date of Birth/Sex: Treating RN: 03-19-1941 (80 y.o. Nancy Fetter Primary Care Clifton Kovacic: Howard Pouch Other Clinician: Referring Caeli Linehan: Treating Jihan Rudy/Extender: [name], [name] Weeks in Treatment: 2 Active Problems Location of Pain Severity and Description of Pain Patient Has Paino No Site Locations Pain Management and Medication Current Pain Management: Electronic Signature(s) Signed: 05/15/2021 5:04:52 PM By: Levan Hurst RN, BSN Entered By: Levan Hurst on 05/12/2021 08:00:22 -------------------------------------------------------------------------------- Patient/Caregiver Education Details Patient Name: Date of Service: Burnett Kanaris D. 7/15/2022andnbsp8:00 A M Medical Record Number: [id number] Patient Account Number: [id number] Date of Birth/Gender: Treating RN: Nov 20, 1940 (80 y.o. Elam Dutch Primary Care Physician: Howard Pouch Other Clinician: Referring Physician: Treating Physician/Extender: Berneice Gandy in Treatment: 2 Education Assessment Education Provided To: Patient Education Topics Provided Venous: Methods: Explain/Verbal Responses: Reinforcements needed, State content correctly Electronic  Signature(s) Signed: 05/12/2021 12:39:56 PM  By: Baruch Gouty RN, BSN Entered By: Baruch Gouty on 05/12/2021 08:33:33 -------------------------------------------------------------------------------- Wound Assessment Details Patient Name: Date of Service: Margaret Holmes, Margaret D. 05/12/2021 8:00 A M Medical Record Number: [id number] Patient Account Number: [id number] Date of Birth/Sex: Treating RN: [birth year] (80 y.o. Nancy Fetter Primary Care Corrin Sieling: Howard Pouch Other Clinician: Referring Olly Shiner: Treating Vivian Okelley/Extender: [name], [name] Weeks in Treatment: 2 Wound Status Wound Number: 1 Primary Etiology: Trauma, Other Wound Location: Left, Anterior Lower Leg Wound Status: Open Wounding Event: Trauma Comorbid History: Cataracts, Anemia, Osteoarthritis Date Acquired: 02/28/2021 Weeks Of Treatment: 2 Clustered Wound: No Photos Wound Measurements Length: (cm) 0.4 Width: (cm) 0.6 Depth: (cm) 0.1 Area: (cm) 0.188 Volume: (cm) 0.019 % Reduction in Area: 50.1% % Reduction in Volume: 74.7% Epithelialization: Large (67-100%) Tunneling: No Undermining: No Wound Description Classification: Full Thickness Without Exposed Support Structures Wound Margin: Distinct, outline attached Exudate Amount: Small Exudate Type: Serosanguineous Exudate Color: red, brown Foul Odor After Cleansing: No Slough/Fibrino No Wound Bed Granulation Amount: Large (67-100%) Exposed Structure Granulation Quality: Pink Fascia Exposed: No Necrotic Amount: None Present (0%) Fat Layer (Subcutaneous Tissue) Exposed: Yes Tendon Exposed: No Muscle Exposed: No Joint Exposed: No Bone Exposed: No Treatment Notes Wound #1 (Lower Leg) Wound Laterality: Left, Anterior Cleanser Soap and Water Discharge Instruction: May shower and wash wound with dial antibacterial soap and water prior to dressing change. Peri-Wound Care Topical Primary Dressing Hydrofera Blue Classic Foam, 2x2 in Discharge Instruction: Moisten with saline  prior to applying to wound bed Santyl Ointment Discharge Instruction: Apply nickel thick amount to wound bed as instructed Secondary Dressing Woven Gauze Sponge, Non-Sterile 4x4 in Discharge Instruction: Apply over primary dressing as directed. Secured With Compression Wrap Kerlix Roll 4.5x3.1 (in/yd) Discharge Instruction: Apply Kerlix and Coban compression as directed. Coban Self-Adherent Wrap 4x5 (in/yd) Discharge Instruction: Apply over Kerlix as directed. Compression Stockings Add-Ons Electronic Signature(s) Signed: 05/12/2021 12:09:51 PM By: Sandre Kitty Signed: 05/15/2021 5:04:52 PM By: Levan Hurst RN, BSN Entered By: Sandre Kitty on 05/12/2021 12:08:46 -------------------------------------------------------------------------------- [place] Details Patient Name: Date of Service: Margaret Holmes, Margaret D. 05/12/2021 8:00 A M Medical Record Number: [id number] Patient Account Number: [id number] Date of Birth/Sex: Treating RN: 11/01/1940 (80 y.o. Nancy Fetter Primary Care Elhadj Girton: Howard Pouch Other Clinician: Referring Benjimen Kelley: Treating Legrande Hao/Extender: [name], [name] Weeks in Treatment: 2 Vital Signs Time Taken: 07:51 Temperature (F): 97.9 Height (in): 63 Pulse (bpm): 62 Weight (lbs): 104 Respiratory Rate (breaths/min): 16 Body Mass Index (BMI): 18.4 Blood Pressure (mmHg): 113/64 Reference Range: 80 - 120 mg / dl Electronic Signature(s) Signed: 05/15/2021 5:04:52 PM By: Levan Hurst RN, BSN Entered By: Levan Hurst on 05/12/2021 08:00:17

## 2021-05-19 ENCOUNTER — Encounter (HOSPITAL_BASED_OUTPATIENT_CLINIC_OR_DEPARTMENT_OTHER): Payer: Medicare Other | Admitting: Internal Medicine

## 2021-05-19 ENCOUNTER — Other Ambulatory Visit: Payer: Self-pay

## 2021-05-19 DIAGNOSIS — L98499 Non-pressure chronic ulcer of skin of other sites with unspecified severity: Secondary | ICD-10-CM | POA: Diagnosis not present

## 2021-05-19 DIAGNOSIS — S81802D Unspecified open wound, left lower leg, subsequent encounter: Secondary | ICD-10-CM

## 2021-05-19 DIAGNOSIS — I872 Venous insufficiency (chronic) (peripheral): Secondary | ICD-10-CM

## 2021-05-19 DIAGNOSIS — E039 Hypothyroidism, unspecified: Secondary | ICD-10-CM | POA: Diagnosis not present

## 2021-05-19 DIAGNOSIS — I890 Lymphedema, not elsewhere classified: Secondary | ICD-10-CM | POA: Diagnosis not present

## 2021-05-19 DIAGNOSIS — Z833 Family history of diabetes mellitus: Secondary | ICD-10-CM | POA: Diagnosis not present

## 2021-05-19 NOTE — Progress Notes (Signed)
Margaret Holmes, Margaret Holmes ([id number]) Visit Report for 05/19/2021 Arrival Information Details Patient Name: Date of Service: Margaret Holmes 05/19/2021 8:00 A M Medical Record Number: [id number] Patient Account Number: [id number] Date of Birth/Sex: Treating RN: 26-May-1941 (80 y.o. Sue Lush Primary Care Aerie Donica: Howard Pouch Other Clinician: Referring Ilian Wessell: Treating Mingo Siegert/Extender: Otelia Santee Weeks in Treatment: 3 Visit Information History Since Last Visit Added or deleted any medications: No Patient Arrived: Ambulatory Any new allergies or adverse reactions: No Arrival Time: 07:55 Had a fall or experienced change in No Transfer Assistance: None activities of daily living that may affect Patient Identification Verified: Yes risk of falls: Secondary Verification Process Completed: Yes Signs or symptoms of abuse/neglect since last visito No Patient Requires Transmission-Based Precautions: No Hospitalized since last visit: No Patient Has Alerts: No Implantable device outside of the clinic excluding No cellular tissue based products placed in the center since last visit: Has Dressing in Place as Prescribed: Yes Has Compression in Place as Prescribed: Yes Pain Present Now: No Electronic Signature(s) Signed: 05/19/2021 1:17:45 PM By: Lorrin Jackson Entered By: Lorrin Jackson on 05/19/2021 07:56:53 -------------------------------------------------------------------------------- Clinic Level of Care Assessment Details Patient Name: Date of Service: Margaret Holmes, Margaret D. 05/19/2021 8:00 A M Medical Record Number: [id number] Patient Account Number: [id number] Date of Birth/Sex: Treating RN: 10-29-1941 (80 y.o. Elam Dutch Primary Care Ariadne Rissmiller: Howard Pouch Other Clinician: Referring Germani Gavilanes: Treating Eadie Repetto/Extender: Otelia Santee Weeks in Treatment: 3 Clinic Level of Care Assessment Items TOOL 4 Quantity Score '[]'$  - 0 Use  when only an EandM is performed on FOLLOW-UP visit ASSESSMENTS - Nursing Assessment / Reassessment X- 1 10 Reassessment of Co-morbidities (includes updates in patient status) X- 1 5 Reassessment of Adherence to Treatment Plan ASSESSMENTS - Wound and Skin A ssessment / Reassessment X - Simple Wound Assessment / Reassessment - one wound 1 5 '[]'$  - 0 Complex Wound Assessment / Reassessment - multiple wounds '[]'$  - 0 Dermatologic / Skin Assessment (not related to wound area) ASSESSMENTS - Focused Assessment X- 1 5 Circumferential Edema Measurements - multi extremities '[]'$  - 0 Nutritional Assessment / Counseling / Intervention X- 1 5 Lower Extremity Assessment (monofilament, tuning fork, pulses) '[]'$  - 0 Peripheral Arterial Disease Assessment (using hand held doppler) ASSESSMENTS - Ostomy and/or Continence Assessment and Care '[]'$  - 0 Incontinence Assessment and Management '[]'$  - 0 Ostomy Care Assessment and Management (repouching, etc.) PROCESS - Coordination of Care X - Simple Patient / Family Education for ongoing care 1 15 '[]'$  - 0 Complex (extensive) Patient / Family Education for ongoing care X- 1 10 Staff obtains Programmer, systems, Records, T Results / Process Orders est '[]'$  - 0 Staff telephones HHA, Nursing Homes / Clarify orders / etc '[]'$  - 0 Routine Transfer to another Facility (non-emergent condition) '[]'$  - 0 Routine Hospital Admission (non-emergent condition) '[]'$  - 0 New Admissions / Biomedical engineer / Ordering NPWT Apligraf, etc. , '[]'$  - 0 Emergency Hospital Admission (emergent condition) X- 1 10 Simple Discharge Coordination '[]'$  - 0 Complex (extensive) Discharge Coordination PROCESS - Special Needs '[]'$  - 0 Pediatric / Minor Patient Management '[]'$  - 0 Isolation Patient Management '[]'$  - 0 Hearing / Language / Visual special needs '[]'$  - 0 Assessment of Community assistance (transportation, D/C planning, etc.) '[]'$  - 0 Additional assistance / Altered mentation '[]'$  - 0 Support  Surface(s) Assessment (bed, cushion, seat, etc.) INTERVENTIONS - Wound Cleansing / Measurement X - Simple Wound Cleansing - one wound 1 5 '[]'$  - 0 Complex Wound  Cleansing - multiple wounds X- 1 5 Wound Imaging (photographs - any number of wounds) '[]'$  - 0 Wound Tracing (instead of photographs) '[]'$  - 0 Simple Wound Measurement - one wound '[]'$  - 0 Complex Wound Measurement - multiple wounds INTERVENTIONS - Wound Dressings X - Small Wound Dressing one or multiple wounds 1 10 '[]'$  - 0 Medium Wound Dressing one or multiple wounds '[]'$  - 0 Large Wound Dressing one or multiple wounds '[]'$  - 0 Application of Medications - topical '[]'$  - 0 Application of Medications - injection INTERVENTIONS - Miscellaneous '[]'$  - 0 External ear exam '[]'$  - 0 Specimen Collection (cultures, biopsies, blood, body fluids, etc.) '[]'$  - 0 Specimen(s) / Culture(s) sent or taken to Lab for analysis '[]'$  - 0 Patient Transfer (multiple staff / Civil Service fast streamer / Similar devices) '[]'$  - 0 Simple Staple / Suture removal (25 or less) '[]'$  - 0 Complex Staple / Suture removal (26 or more) '[]'$  - 0 Hypo / Hyperglycemic Management (close monitor of Blood Glucose) '[]'$  - 0 Ankle / Brachial Index (ABI) - do not check if billed separately X- 1 5 Vital Signs Has the patient been seen at the hospital within the last three years: Yes Total Score: 90 Level Of Care: New/Established - Level 3 Electronic Signature(s) Signed: 05/19/2021 1:37:28 PM By: Baruch Gouty RN, BSN Entered By: Baruch Gouty on 05/19/2021 09:12:12 -------------------------------------------------------------------------------- Lower Extremity Assessment Details Patient Name: Date of Service: Margaret Holmes, Margaret D. 05/19/2021 8:00 A M Medical Record Number: [id number] Patient Account Number: [id number] Date of Birth/Sex: Treating RN: [birth year] (80 y.o. Sue Lush Primary Care Sayde Lish: Howard Pouch Other Clinician: Referring Cheveyo [place]: Treating Aadhya Bustamante/Extender:  [name], [name] Weeks in Treatment: 3 Edema Assessment Assessed: [Left: Yes] [Right: No] Edema: [Left: N] [Right: o] Calf Left: Right: Point of Measurement: 27 cm From Medial Instep 30.4 cm Ankle Left: Right: Point of Measurement: 9 cm From Medial Instep 19.7 cm Vascular Assessment Pulses: Dorsalis Pedis Palpable: [Left:Yes] Electronic Signature(s) Signed: 05/19/2021 1:17:45 PM By: Lorrin Jackson Entered By: Lorrin Jackson on 05/19/2021 08:03:39 -------------------------------------------------------------------------------- Multi Wound Chart Details Patient Name: Date of Service: Margaret Holmes, Margaret D. 05/19/2021 8:00 A M Medical Record Number: [id number] Patient Account Number: [id number] Date of Birth/Sex: Treating RN: [birth year] (80 y.o. Elam Dutch Primary Care Berklee Battey: Howard Pouch Other Clinician: Referring Emanie Behan: Treating Adeola Dennen/Extender: [name], [name] Weeks in Treatment: 3 Vital Signs Height(in): 63 Pulse(bpm): 68 Weight(lbs): 104 Blood Pressure(mmHg): 117/69 Body Mass Index(BMI): 18 Temperature(F): 98.3 Respiratory Rate(breaths/min): 16 Photos: [N/A:N/A] Left, Anterior Lower Leg N/A N/A Wound Location: Trauma N/A N/A Wounding Event: Trauma, Other N/A N/A Primary Etiology: Cataracts, Anemia, Osteoarthritis N/A N/A Comorbid History: 02/28/2021 N/A N/A Date Acquired: 3 N/A N/A Weeks of Treatment: Healed - Epithelialized N/A N/A Wound Status: 0x0x0 N/A N/A Measurements L x W x D (cm) 0 N/A N/A A (cm) : rea 0 N/A N/A Volume (cm) : 100.00% N/A N/A % Reduction in A rea: 100.00% N/A N/A % Reduction in Volume: Full Thickness Without Exposed N/A N/A Classification: Support Structures Small N/A N/A Exudate Amount: Serosanguineous N/A N/A Exudate Type: red, brown N/A N/A Exudate Color: Distinct, outline attached N/A N/A Wound Margin: Large (67-100%) N/A N/A Granulation Amount: Pink N/A  N/A Granulation Quality: None Present (0%) N/A N/A Necrotic Amount: Fat Layer (Subcutaneous Tissue): Yes N/A N/A Exposed Structures: Fascia: No Tendon: No Muscle: No Joint: No Bone: No Large (67-100%) N/A N/A Epithelialization: Treatment Notes Electronic Signature(s) Signed: 05/19/2021 9:21:19 AM By: Kalman Shan DO Signed:  05/19/2021 1:37:28 PM By: Baruch Gouty RN, BSN Entered By: Kalman Shan on 05/19/2021 09:17:47 -------------------------------------------------------------------------------- Multi-Disciplinary Care Plan Details Patient Name: Date of Service: Margaret Holmes, Margaret D. 05/19/2021 8:00 A M Medical Record Number: [id number] Patient Account Number: [id number] Date of Birth/Sex: Treating RN: 03-29-1941 (80 y.o. Elam Dutch Primary Care Tavionna Grout: Howard Pouch Other Clinician: Referring Merranda Bolls: Treating Shaketha Jeon/Extender: Otelia Santee Weeks in Treatment: 3 Active Inactive Electronic Signature(s) Signed: 05/19/2021 1:37:28 PM By: Baruch Gouty RN, BSN Entered By: Baruch Gouty on 05/19/2021 08:31:45 -------------------------------------------------------------------------------- Pain Assessment Details Patient Name: Date of Service: Margaret Holmes, Laronica D. 05/19/2021 8:00 A M Medical Record Number: [id number] Patient Account Number: [id number] Date of Birth/Sex: Treating RN: 02-24-1941 (80 y.o. Sue Lush Primary Care Inigo Lantigua: Howard Pouch Other Clinician: Referring Margaret Garfinkle: Treating Danaysha Kirn/Extender: Otelia Santee Weeks in Treatment: 3 Active Problems Location of Pain Severity and Description of Pain Patient Has Paino No Site Locations Pain Management and Medication Current Pain Management: Electronic Signature(s) Signed: 05/19/2021 1:17:45 PM By: Lorrin Jackson Entered By: Lorrin Jackson on 05/19/2021  07:57:03 -------------------------------------------------------------------------------- Patient/Caregiver Education Details Patient Name: Date of Service: Margaret Holmes, Margaret D. 7/22/2022andnbsp8:00 [place] Record Number: [id number] Patient Account Number: [id number] Date of Birth/Gender: Treating RN: 1941/07/24 (80 y.o. Elam Dutch Primary Care Physician: Howard Pouch Other Clinician: Referring Physician: Treating Physician/Extender: Berneice Gandy in Treatment: 3 Education Assessment Education Provided To: Patient Education Topics Provided Wound/Skin Impairment: Methods: Explain/Verbal Responses: Reinforcements needed, State content correctly Electronic Signature(s) Signed: 05/19/2021 1:37:28 PM By: Baruch Gouty RN, BSN Entered By: Baruch Gouty on 05/19/2021 08:18:19 -------------------------------------------------------------------------------- Wound Assessment Details Patient Name: Date of Service: Margaret Holmes, Margaret Basta D. 05/19/2021 8:00 A M Medical Record Number: [id number] Patient Account Number: [id number] Date of Birth/Sex: Treating RN: 1941/09/12 (80 y.o. Elam Dutch Primary Care Sirus Labrie: Howard Pouch Other Clinician: Referring Peachie Barkalow: Treating Ulah Olmo/Extender: [name], [name] Weeks in Treatment: 3 Wound Status Wound Number: 1 Primary Etiology: Trauma, Other Wound Location: Left, Anterior Lower Leg Wound Status: Healed - Epithelialized Wounding Event: Trauma Comorbid History: Cataracts, Anemia, Osteoarthritis Date Acquired: 02/28/2021 Weeks Of Treatment: 3 Clustered Wound: No Photos Wound Measurements Length: (cm) Width: (cm) Depth: (cm) Area: (cm) Volume: (cm) 0 % Reduction in Area: 100% 0 % Reduction in Volume: 100% 0 Epithelialization: Large (67-100%) 0 Tunneling: No 0 Undermining: No Wound Description Classification: Full Thickness Without Exposed Support Structures Wound Margin:  Distinct, outline attached Exudate Amount: Small Exudate Type: Serosanguineous Exudate Color: red, brown Foul Odor After Cleansing: No Slough/Fibrino No Wound Bed Granulation Amount: Large (67-100%) Exposed Structure Granulation Quality: Pink Fascia Exposed: No Necrotic Amount: None Present (0%) Fat Layer (Subcutaneous Tissue) Exposed: Yes Tendon Exposed: No Muscle Exposed: No Joint Exposed: No Bone Exposed: No Electronic Signature(s) Signed: 05/19/2021 1:37:28 PM By: Baruch Gouty RN, BSN Entered By: Baruch Gouty on 05/19/2021 08:29:39 -------------------------------------------------------------------------------- Vitals Details Patient Name: Date of Service: Margaret Holmes, Margaret D. 05/19/2021 8:00 A M Medical Record Number: [id number] Patient Account Number: [id number] Date of Birth/Sex: Treating RN: Feb 28, 1941 (80 y.o. Sue Lush Primary Care Finnean Cerami: Howard Pouch Other Clinician: Referring Verneice Caspers: Treating Khaleb Broz/Extender: Otelia Santee Weeks in Treatment: 3 Vital Signs Time Taken: 07:57 Temperature (F): 98.3 Height (in): 63 Pulse (bpm): 68 Weight (lbs): 104 Respiratory Rate (breaths/min): 16 Body Mass Index (BMI): 18.4 Blood Pressure (mmHg): 117/69 Reference Range: 80 - 120 mg / dl Electronic Signature(s) Signed: 05/19/2021 1:17:45 PM By: Lorrin Jackson Entered By: Lorrin Jackson on 05/19/2021 07:58:47

## 2021-05-19 NOTE — Progress Notes (Signed)
Margaret, Holmes ([id number]) Visit Report for 05/19/2021 Chief Complaint Document Details Patient Name: Date of Service: Margaret, Holmes 05/19/2021 8:00 A M Medical Record Number: [id number] Patient Account Number: [id number] Date of Birth/Sex: Treating RN: December 21, 1940 (80 y.o. Margaret Holmes Primary Care Provider: Howard Pouch Other Clinician: Referring Provider: Treating Provider/Extender: Otelia Santee Weeks in Treatment: 3 Information Obtained from: Patient Chief Complaint Left lower extremity wound Electronic Signature(s) Signed: 05/19/2021 9:21:19 AM By: Kalman Shan DO Entered By: Kalman Shan on 05/19/2021 09:17:54 -------------------------------------------------------------------------------- HPI Details Patient Name: Date of Service: Margaret Holmes, Margaret D. 05/19/2021 8:00 A M Medical Record Number: [id number] Patient Account Number: [id number] Date of Birth/Sex: Treating RN: 1941-09-17 (80 y.o. Margaret Holmes Primary Care Provider: Howard Pouch Other Clinician: Referring Provider: Treating Provider/Extender: Otelia Santee Weeks in Treatment: 3 History of Present Illness HPI Description: Admission 6/30 Ms. Margaret Holmes is a 80 year old female with a past medical history of hypothyroidism that presents to the clinic for left lower extremity wound. She states that 4 weeks ago she was cutting wood and a piece of wood hit her anterior shin. She has been using Epson salt baths, hydrogen peroxide and antibiotic ointment to the area. She does report being treated for cellulitis to this leg recently with doxycycline. She currently denies signs of infection. 7/8; patient presents for 1 week follow-up. She has tolerated the compression wrap well. She denies any issues and reports no signs of infection. 7/15; patient presents for 1 week follow-up. She continues to tolerate the compression wrap well. She has no issues or complaints  today. She does denies signs of infection. 7/22; patient presents for 1 week follow-up. She tolerated the compression wrap well. She reports Improvement in her wound healing. She has no issues or complaints today. Electronic Signature(s) Signed: 05/19/2021 9:21:19 AM By: Kalman Shan DO Entered By: Kalman Shan on 05/19/2021 09:18:36 -------------------------------------------------------------------------------- Physical Exam Details Patient Name: Date of Service: Margaret Holmes, Margaret D. 05/19/2021 8:00 A M Medical Record Number: [id number] Patient Account Number: [id number] Date of Birth/Sex: Treating RN: 1941-09-07 (80 y.o. Margaret Holmes Primary Care Provider: Howard Pouch Other Clinician: Referring Provider: Treating Provider/Extender: [name], [name] Weeks in Treatment: 3 Constitutional respirations regular, non-labored and within target range for patient.. Cardiovascular 2+ dorsalis pedis/posterior tibialis pulses. Psychiatric pleasant and cooperative. Notes Left lower extremity: Epithelialization to the previous wound site on her anterior shin. The new tissue appears frail Electronic Signature(s) Signed: 05/19/2021 9:21:19 AM By: Kalman Shan DO Entered By: Kalman Shan on 05/19/2021 09:19:11 -------------------------------------------------------------------------------- Physician Orders Details Patient Name: Date of Service: Margaret Holmes, Margaret D. 05/19/2021 8:00 A M Medical Record Number: [id number] Patient Account Number: [id number] Date of Birth/Sex: Treating RN: 18-Mar-1941 (80 y.o. Margaret Holmes Primary Care Provider: Howard Pouch Other Clinician: Referring Provider: Treating Provider/Extender: Otelia Santee Weeks in Treatment: 3 Verbal / Phone Orders: No Diagnosis Coding ICD-10 Coding Code Description S81.802D Unspecified open wound, left lower leg, subsequent encounter I87.2 Venous insufficiency (chronic)  (peripheral) E03.9 Hypothyroidism, unspecified Discharge From [facility] Services Discharge from [place] Bathing/ Shower/ Hygiene May shower and wash wound with soap and water. Edema Control - Lymphedema / SCD / Other Elevate legs to the level of the heart or above for 30 minutes daily and/or when sitting, a frequency of: - throughout the day Avoid standing for long periods of time. Exercise regularly Moisturize legs daily. Non Wound Condition Protect area with: - foam border or gauze for next 1-2  weeks to protect Electronic Signature(s) Signed: 05/19/2021 9:21:19 AM By: Kalman Shan DO Entered By: Kalman Shan on 05/19/2021 09:19:22 -------------------------------------------------------------------------------- Problem List Details Patient Name: Date of Service: Margaret Holmes, Margaret D. 05/19/2021 8:00 A M Medical Record Number: [id number] Patient Account Number: [id number] Date of Birth/Sex: Treating RN: 1941/06/30 (80 y.o. Margaret Holmes Primary Care Provider: Howard Pouch Other Clinician: Referring Provider: Treating Provider/Extender: Otelia Santee Weeks in Treatment: 3 Active Problems ICD-10 Encounter Code Description Active Date MDM Diagnosis S81.802D Unspecified open wound, left lower leg, subsequent encounter 05/05/2021 No Yes I87.2 Venous insufficiency (chronic) (peripheral) 04/27/2021 No Yes E03.9 Hypothyroidism, unspecified 04/27/2021 No Yes Inactive Problems Resolved Problems ICD-10 Code Description Active Date Resolved Date S81.802A Unspecified open wound, left lower leg, initial encounter 04/27/2021 04/27/2021 Electronic Signature(s) Signed: 05/19/2021 9:21:19 AM By: Kalman Shan DO Entered By: Kalman Shan on 05/19/2021 09:17:42 -------------------------------------------------------------------------------- Progress Note Details Patient Name: Date of Service: Margaret Holmes, Margaret D. 05/19/2021 8:00 A M Medical Record Number:  [id number] Patient Account Number: [id number] Date of Birth/Sex: Treating RN: 03-15-1941 (80 y.o. Margaret Holmes Primary Care Provider: Howard Pouch Other Clinician: Referring Provider: Treating Provider/Extender: Otelia Santee Weeks in Treatment: 3 Subjective Chief Complaint Information obtained from Patient Left lower extremity wound History of Present Illness (HPI) Admission 6/30 Ms. Summar Coole is a 80 year old female with a past medical history of hypothyroidism that presents to the clinic for left lower extremity wound. She states that 4 weeks ago she was cutting wood and a piece of wood hit her anterior shin. She has been using Epson salt baths, hydrogen peroxide and antibiotic ointment to the area. She does report being treated for cellulitis to this leg recently with doxycycline. She currently denies signs of infection. 7/8; patient presents for 1 week follow-up. She has tolerated the compression wrap well. She denies any issues and reports no signs of infection. 7/15; patient presents for 1 week follow-up. She continues to tolerate the compression wrap well. She has no issues or complaints today. She does denies signs of infection. 7/22; patient presents for 1 week follow-up. She tolerated the compression wrap well. She reports Improvement in her wound healing. She has no issues or complaints today. Patient History Unable to Obtain Patient History due to Altered Mental Status. Information obtained from Patient. Family History Cancer - Mother,Maternal Grandparents, Diabetes - Mother,Father, Heart Disease - Father, Hypertension - Father, No family history of Hereditary Spherocytosis, Kidney Disease, Lung Disease, Seizures, Stroke, Thyroid Problems, Tuberculosis. Social History Never smoker, Marital Status - Married, Alcohol Use - Never, Drug Use - No History, Caffeine Use - Daily - T Coffee. ea, Medical History Eyes Patient has history of Cataracts -  Had surgery Hematologic/Lymphatic Patient has history of Anemia Musculoskeletal Patient has history of Osteoarthritis Hospitalization/Surgery History - Band Hemorrhoidectomy. - Cataract Extraction w/ Implant 12/29/2. - Polypectomy [id number]. - DandC 04/2016. - Partial Thyroidectomy 1972. - Carpal Tunnel Surgery 28. Medical A Surgical History Notes nd Endocrine Hypothyroidism- partial thyroidectomy Objective Constitutional respirations regular, non-labored and within target range for patient.. Vitals Time Taken: 7:57 AM, Height: 63 in, Weight: 104 lbs, BMI: 18.4, Temperature: 98.3 F, Pulse: 68 bpm, Respiratory Rate: 16 breaths/min, Blood Pressure: 117/69 mmHg. Cardiovascular 2+ dorsalis pedis/posterior tibialis pulses. Psychiatric pleasant and cooperative. General Notes: Left lower extremity: Epithelialization to the previous wound site on her anterior shin. The new tissue appears frail Integumentary (Hair, Skin) Wound #1 status is Healed - Epithelialized. Original cause of wound was Trauma. The date acquired was:  02/28/2021. The wound has been in treatment 3 weeks. The wound is located on the Left,Anterior Lower Leg. The wound measures 0cm length x 0cm width x 0cm depth; 0cm^2 area and 0cm^3 volume. There is Fat Layer (Subcutaneous Tissue) exposed. There is no tunneling or undermining noted. There is a small amount of serosanguineous drainage noted. The wound margin is distinct with the outline attached to the wound base. There is large (67-100%) pink granulation within the wound bed. There is no necrotic tissue within the wound bed. Assessment Active Problems ICD-10 Unspecified open wound, left lower leg, subsequent encounter Venous insufficiency (chronic) (peripheral) Hypothyroidism, unspecified Patient's wound has done very well with the use of Kerlix/Coban and Hydrofera Blue with Santyl. She is closed today however the skin is very frail. We will put a foam border dressing on  this. She has an additional 1 to place in 2 days. I do not think she needs to follow-up unless she has any issues. She would like to be discharged today. Plan Discharge From [facility] Services: Discharge from [place] Bathing/ Shower/ Hygiene: May shower and wash wound with soap and water. Edema Control - Lymphedema / SCD / Other: Elevate legs to the level of the heart or above for 30 minutes daily and/or when sitting, a frequency of: - throughout the day Avoid standing for long periods of time. Exercise regularly Moisturize legs daily. Non Wound Condition: Protect area with: - foam border or gauze for next 1-2 weeks to protect 1. Discharge from our clinic due to closed wound 2. Follow-up as needed 3. Keep area protected for the next week Electronic Signature(s) Signed: 05/19/2021 9:21:19 AM By: Kalman Shan DO Entered By: Kalman Shan on 05/19/2021 09:20:39 -------------------------------------------------------------------------------- HxROS Details Patient Name: Date of Service: Margaret Holmes, Margaret D. 05/19/2021 8:00 A M Medical Record Number: [id number] Patient Account Number: [id number] Date of Birth/Sex: Treating RN: [birth year] (80 y.o. Margaret Holmes Primary Care Provider: Howard Pouch Other Clinician: Referring Provider: Treating Provider/Extender: [name], [name] Weeks in Treatment: 3 Unable to Obtain Patient History due to Altered Mental Status Information Obtained From Patient Eyes Medical History: Positive for: Cataracts - Had surgery Hematologic/Lymphatic Medical History: Positive for: Anemia Endocrine Medical History: Past Medical History Notes: Hypothyroidism- partial thyroidectomy Musculoskeletal Medical History: Positive for: Osteoarthritis HBO Extended History Items Eyes: Cataracts Immunizations Pneumococcal Vaccine: Received Pneumococcal Vaccination: Yes Implantable Devices None Hospitalization / Surgery  History Type of Hospitalization/Surgery Band Hemorrhoidectomy Cataract Extraction w/ Implant 12/29/2 Polypectomy 2013,2016 DandC 04/2016 Partial Thyroidectomy 1972 Carpal Tunnel Surgery 28 Family and Social History Cancer: Yes - Mother,Maternal Grandparents; Diabetes: Yes - Mother,Father; Heart Disease: Yes - Father; Hereditary Spherocytosis: No; Hypertension: Yes - Father; Kidney Disease: No; Lung Disease: No; Seizures: No; Stroke: No; Thyroid Problems: No; Tuberculosis: No; Never smoker; Marital Status - Married; Alcohol Use: Never; Drug Use: No History; Caffeine Use: Daily - T Coffee; Financial Concerns: No; Food, Clothing or Shelter Needs: No; Support ea, System Lacking: No; Transportation Concerns: No Electronic Signature(s) Signed: 05/19/2021 9:21:19 AM By: Kalman Shan DO Signed: 05/19/2021 1:37:28 PM By: Baruch Gouty RN, BSN Entered By: Kalman Shan on 05/19/2021 09:18:42 -------------------------------------------------------------------------------- [place] Details Patient Name: Date of Service: Margaret Holmes, Margaret D. 05/19/2021 Medical Record Number: [id number] Patient Account Number: [id number] Date of Birth/Sex: Treating RN: February 22, 1941 (80 y.o. Margaret Holmes Primary Care Provider: Howard Pouch Other Clinician: Referring Provider: Treating Provider/Extender: Otelia Santee Weeks in Treatment: 3 Diagnosis Coding ICD-10 Codes Code Description [phone] Unspecified open wound, left lower  leg, subsequent encounter I87.2 Venous insufficiency (chronic) (peripheral) E03.9 Hypothyroidism, unspecified Facility Procedures CPT4 Code: [id number] Description: [id number] - WOUND CARE VISIT-LEV 3 EST PT Modifier: Quantity: 1 Physician Procedures : CPT4 Code Description Modifier [id number] - WC PHYS LEVEL 3 - EST PT ICD-10 Diagnosis Description S81.802D Unspecified open wound, left lower leg, subsequent encounter I87.2 Venous insufficiency (chronic)  (peripheral) E03.9 Hypothyroidism,  unspecified Quantity: 1 Electronic Signature(s) Signed: 05/19/2021 9:21:19 AM By: Kalman Shan DO Entered By: Kalman Shan on 05/19/2021 09:20:53

## 2021-06-01 ENCOUNTER — Encounter: Payer: Self-pay | Admitting: Family Medicine

## 2021-06-01 ENCOUNTER — Ambulatory Visit (INDEPENDENT_AMBULATORY_CARE_PROVIDER_SITE_OTHER): Payer: Medicare Other | Admitting: Family Medicine

## 2021-06-01 ENCOUNTER — Other Ambulatory Visit: Payer: Self-pay

## 2021-06-01 VITALS — BP 106/61 | HR 60 | Temp 97.8°F | Ht 62.0 in | Wt 108.0 lb

## 2021-06-01 DIAGNOSIS — G479 Sleep disorder, unspecified: Secondary | ICD-10-CM | POA: Diagnosis not present

## 2021-06-01 DIAGNOSIS — E611 Iron deficiency: Secondary | ICD-10-CM | POA: Diagnosis not present

## 2021-06-01 DIAGNOSIS — E782 Mixed hyperlipidemia: Secondary | ICD-10-CM

## 2021-06-01 DIAGNOSIS — E559 Vitamin D deficiency, unspecified: Secondary | ICD-10-CM | POA: Diagnosis not present

## 2021-06-01 DIAGNOSIS — E039 Hypothyroidism, unspecified: Secondary | ICD-10-CM

## 2021-06-01 DIAGNOSIS — E538 Deficiency of other specified B group vitamins: Secondary | ICD-10-CM | POA: Diagnosis not present

## 2021-06-01 LAB — LIPID PANEL
Cholesterol: 182 mg/dL (ref 0–200)
HDL: 50.2 mg/dL (ref >39.00)
LDL Cholesterol: 118 mg/dL — ABNORMAL HIGH (ref 0–99)
NonHDL: 132.14
Total CHOL/HDL Ratio: 4
Triglycerides: 73 mg/dL (ref 0.0–149.0)
VLDL: 14.6 mg/dL (ref 0.0–40.0)

## 2021-06-01 LAB — COMPREHENSIVE METABOLIC PANEL
ALT: 10 U/L (ref 0–35)
AST: 18 U/L (ref 0–37)
Albumin: 3.9 g/dL (ref 3.5–5.2)
Alkaline Phosphatase: 48 U/L (ref 39–117)
BUN: 16 mg/dL (ref 6–23)
CO2: 30 mEq/L (ref 19–32)
Calcium: 8.8 mg/dL (ref 8.4–10.5)
Chloride: 103 mEq/L (ref 96–112)
Creatinine, Ser: 1.02 mg/dL (ref 0.40–1.20)
GFR: 53.03 mL/min — ABNORMAL LOW (ref >60.00)
Glucose, Bld: 93 mg/dL (ref 70–99)
Potassium: 4.1 mEq/L (ref 3.5–5.1)
Sodium: 139 mEq/L (ref 135–145)
Total Bilirubin: 0.7 mg/dL (ref 0.2–1.2)
Total Protein: 6.5 g/dL (ref 6.0–8.3)

## 2021-06-01 LAB — CBC WITH DIFFERENTIAL/PLATELET
Basophils Absolute: 0 10*3/uL (ref 0.0–0.1)
Basophils Relative: 0.9 % (ref 0.0–3.0)
Eosinophils Absolute: 0.2 10*3/uL (ref 0.0–0.7)
Eosinophils Relative: 3.4 % (ref 0.0–5.0)
HCT: 37.3 % (ref 36.0–46.0)
Hemoglobin: 12.2 g/dL (ref 12.0–15.0)
Lymphocytes Relative: 24.2 % (ref 12.0–46.0)
Lymphs Abs: 1.3 10*3/uL (ref 0.7–4.0)
MCHC: 32.7 g/dL (ref 30.0–36.0)
MCV: 89 fl (ref 78.0–100.0)
Monocytes Absolute: 0.7 10*3/uL (ref 0.1–1.0)
Monocytes Relative: 14.1 % — ABNORMAL HIGH (ref 3.0–12.0)
Neutro Abs: 3 10*3/uL (ref 1.4–7.7)
Neutrophils Relative %: 57.4 % (ref 43.0–77.0)
Platelets: 223 10*3/uL (ref 150.0–400.0)
RBC: 4.2 Mil/uL (ref 3.87–5.11)
RDW: 15.1 % (ref 11.5–15.5)
WBC: 5.3 10*3/uL (ref 4.0–10.5)

## 2021-06-01 LAB — VITAMIN D 25 HYDROXY (VIT D DEFICIENCY, FRACTURES): VITD: 33.6 ng/mL (ref 30.00–100.00)

## 2021-06-01 LAB — T4, FREE: Free T4: 1.09 ng/dL (ref 0.60–1.60)

## 2021-06-01 LAB — TSH: TSH: 2.83 u[IU]/mL (ref 0.35–5.50)

## 2021-06-01 LAB — VITAMIN B12: Vitamin B-12: 1098 pg/mL — ABNORMAL HIGH (ref 211–911)

## 2021-06-01 NOTE — Patient Instructions (Addendum)
Great to see you today.  I have refilled the medication(s) we provide.   If labs were collected, we will inform you of lab results once received either by echart message or telephone call.   - echart message- for normal results that have been seen by the patient already.   - telephone call: abnormal results or if patient has not viewed results in their echart.  

## 2021-06-01 NOTE — Progress Notes (Signed)
This visit occurred during the SARS-CoV-2 public health emergency.  Safety protocols were in place, including screening questions prior to the visit, additional usage of staff PPE, and extensive cleaning of exam room while observing appropriate contact time as indicated for disinfecting solutions.    Margaret Holmes , July 17, 1941, 80 y.o., female MRN: [id number] Patient Care Team    Relationship Specialty Notifications Start End  [name], DO PCP - General Family Medicine  04/09/16   Richmond Campbell, MD Consulting Physician Gastroenterology  05/23/16   Salvadore Dom, MD Consulting Physician Obstetrics and Gynecology  05/23/16   Leroy Sea, MD Referring Physician Urology  08/27/16   [facility]  Optometry  09/01/18     Chief Complaint  Patient presents with   Hypothyroidism    [facility]; pt is fasting     Subjective: Margaret Holmes is a 80 y.o. present for [facility].  Hypothyroidism: pt reports compliance with levo 75 mcg Qd on an empty stomach x 6 days a week. Denies side effects or weight loss.  Hyperlipidemia: She is not on a statin. She has been able to control with diet alone . Vit d def: taking supplement.  B12 def: taking supplement.  Sleep disturbance: only taking remeron on occassions. She doe snot desire refills at this time.  Depression screen [facility] 2/9 06/01/2021 03/03/2021 06/15/2020 09/01/2018 09/01/2018  Decreased Interest 0 0 0 0 0  Down, Depressed, Hopeless 0 0 1 0 0  PHQ - 2 Score 0 0 1 0 0    Allergies  Allergen Reactions   Diazepam Other (See Comments)    Psychiatric reaction Caused forgetfulness   Codeine Other (See Comments)    Not specified in old records.   Darvocet [Propoxyphene N-Acetaminophen] Other (See Comments)    Not specified in old records   Hydrocodone Other (See Comments)    Not specified in old records   Social History   Social History Narrative   Married, 3 children, 6 grandchildren, 1 GGc.   Orig from  this region.  Drives a school bus (X 31 yrs).   No T/A/Ds.   Caffeine: one cup coffee a day, 3 glasses of sweet tea per day.   No formal exercise.  She is active (NOT sedentary).            Past Medical History:  Diagnosis Date   Adnexal cyst 07/2013   Left-3cm-noted on CT abd/pelv--f/u pelvic u/s did not show anything (L ovary not visualized, likely secondary to bowel gas)-repeat pelvic u/s after 07/2014.   Anemia    Asthma    DDD (degenerative disc disease), cervical    H/O angular cheilitis    Zinc normal, vit B12 low normal (2012)   H/O vitamin D deficiency 2011   Came back to normal range with replacement therapydd   History of adenomatous polyp of colon 2003, 2006, 2013;2016   Repeat 2013 showed tubular adenoma x 3, with no high grade dysplasia.  2016 no polyps-recall 5 yrs   History of hiatal hernia    Hyperlipidemia    per pt report 05/09/12; also on labs 06/2015.  Intolerant of pravachol 02/2016   Hypothyroidism    Kidney infection    Lower leg pain    Bilateral, crampy--ABIs/dopplers NORMAL 04/2013.   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  +internal hem.  Normal ileoscopy.   DILATATION & CURETTAGE/HYSTEROSCOPY WITH MYOSURE N/A 05/28/2016   Procedure: DILATATION & CURETTAGE/HYSTEROSCOPY WITH MYOSURE with ULTRASOUND guidance;  Surgeon: Salvadore Dom, MD;  Location: [facility];  Service: Gynecology;  Laterality: N/A;  Please have ultrasound in room. Follow Silva's first case.   GANGLION CYST EXCISION  2003   Right wrist, with excision of some triquetrum spurs    stent placed right kidney     THYROIDECTOMY, PARTIAL  1972   TONSILLECTOMY AND ADENOIDECTOMY  age 64   TUBAL LIGATION     WRIST SURGERY  2006   For left thumb carpo-metacarpal arthritis   Family History  Problem Relation Age of Onset   Cancer Mother        ovarian and throat cancer   Diabetes Mother    Alcohol abuse Father        alcoholism.  Died age 71   Cancer Maternal Grandmother        breast cancer   Allergies as of 06/01/2021       Reactions   Diazepam Other (See Comments)   Psychiatric reaction Caused forgetfulness   Codeine Other (See Comments)   Not specified in old records.   Darvocet [propoxyphene N-acetaminophen] Other (See Comments)   Not specified in old records   Hydrocodone Other (See Comments)   Not specified in old records        Medication List        Accurate as of June 01, 2021  8:45 AM. If you have any questions, ask your nurse or doctor.          STOP taking these medications    naproxen 500 MG tablet Commonly known as: Naprosyn Stopped by: Howard Pouch, DO       TAKE these medications    acetaminophen 325 MG tablet Commonly known as: TYLENOL Take 650 mg by mouth every 6 (six) hours as needed.   cetirizine 10 MG tablet Commonly known as: ZYRTEC Take 10 mg by mouth daily.   diclofenac sodium 1 % Gel Commonly known as: VOLTAREN Apply over affected area QID RPN   fluticasone 50 MCG/ACT nasal spray Commonly known as: FLONASE Place into the nose.   levothyroxine 75 MCG tablet Commonly known as: Synthroid 1 tab daily p.o. on an empty stomach 6 days a week   mirtazapine 7.5 MG tablet Commonly known as: REMERON Take 1 tablet (7.5 mg total) by mouth at bedtime.   ProAir HFA 108 (90 Base) MCG/ACT inhaler Generic drug: albuterol Inhale 1-2 puffs into the lungs every 6 (six) hours as needed. Reported on 05/15/2016   vitamin B-12 1000 MCG tablet Commonly known as: CYANOCOBALAMIN Take 1 tablet (1,000 mcg total) by mouth  daily.   VITAMIN C-VITAMIN D-ZINC PO Take by mouth daily.        All past medical history, surgical history, allergies, family history, immunizations andmedications were updated in the EMR today and reviewed under the history and medication portions of their EMR.     ROS: Negative, with the exception of above mentioned in HPI   Objective:  BP 106/61   Pulse 60   Temp 97.8 F (36.6 C) (Oral)  Ht '5\' 2"'$  (1.575 m)   Wt 108 lb (49 kg)   SpO2 98%   BMI 19.75 kg/m  Body mass index is 19.75 kg/m. Gen: Afebrile. No acute distress. Nontoxic, pleasant thin female.  HENT: AT. [place].  Neck/lymp/endocrine: Supple,no lymphadenopathy, no thyromegaly CV: RRR no murmur, no edema Chest: CTAB, no wheeze or crackles Abd: Soft. NTND. BS present Skin: no rashes, purpura or petechiae.  Neuro: Normal gait. PERLA. EOMi. Alert. Oriented x3 Psych: Normal affect, dress and demeanor. Normal speech. Normal thought content and judgment.   No results found. No results found. No results found for this or any previous visit (from the past 24 hour(s)).  Assessment/Plan: Pearland SANDOVAL is a 80 y.o. female present for OV for  Hypothyroidism, unspecified type/abnormal TSH Continue levothyroxine 75 mcg 6 days a week if labs are normal today. Otherwise will alter dose. If able will prescribe dose that she can take one tab daily per her preference. Refills will be called in after results.  Tsh, t4 free F/u yearly  Hyperlipidemia:  She has been able to keep lipids well controlled on diet alone.  Cbc, cmp, tsh and lipids collected today.   B12 deficiency: Was found to have rather low B12 at 279.   She is taking b12 daily.  B12 collected today  sleep disturbance Stable.  Continue  Remeron to 7.5 mg p.o. nightly prn.  She reports she does not need refills today.   Vitamin D deficiency On supplementation.  DEXA normal 2017 - Vitamin D (25 hydroxy)>collected today  Iron deficiency She has a history  of iron deficiency currently not supplementing - Iron, TIBC and Ferritin Panel> collected today   Reviewed expectations re: course of current medical issues. Discussed self-management of symptoms. Outlined signs and symptoms indicating need for more acute intervention. Patient verbalized understanding and all questions were answered. Patient received an After-Visit Summary.    Orders Placed This Encounter  Procedures   CBC with Differential/Platelet   Comprehensive metabolic panel   Lipid panel   TSH   Vitamin B12   T4, free   Vitamin D (25 hydroxy)   Iron, TIBC and Ferritin Panel    No orders of the defined types were placed in this encounter.  Referral Orders  No referral(s) requested today     Note is dictated utilizing voice recognition software. Although note has been proof read prior to signing, occasional typographical errors still can be missed. If any questions arise, please do not hesitate to call for verification.   electronically signed by:  Howard Pouch, DO  [place]

## 2021-06-02 ENCOUNTER — Telehealth: Payer: Self-pay | Admitting: Family Medicine

## 2021-06-02 LAB — IRON,TIBC AND FERRITIN PANEL
%SAT: 33 % (calc) (ref 16–45)
Ferritin: 103 ng/mL (ref 16–288)
Iron: 79 ug/dL (ref 45–160)
TIBC: 241 mcg/dL (calc) — ABNORMAL LOW (ref 250–450)

## 2021-06-02 MED ORDER — LEVOTHYROXINE SODIUM 50 MCG PO TABS: ORAL_TABLET | ORAL | 3 refills | Status: DC

## 2021-06-02 NOTE — Telephone Encounter (Signed)
Please call patient Liver, kidney and thyroid function are normal Blood cell counts and electrolytes are normal Vitamin D and B12 levels look excellent. Iron levels are good. Leukosis normal. Cholesterol panel looks great and is at goal for her.  I have called in refills on her levothyroxine.  I have changed the dose so that she can take 1 tab daily-7 days a week.

## 2021-06-05 NOTE — Telephone Encounter (Signed)
Spoke with pt regarding labs and instructions.   

## 2021-06-06 ENCOUNTER — Ambulatory Visit: Payer: Medicare Other | Admitting: Family Medicine

## 2021-06-14 ENCOUNTER — Ambulatory Visit: Payer: Medicare Other | Admitting: Family Medicine

## 2021-07-18 ENCOUNTER — Telehealth: Payer: Self-pay

## 2021-07-18 NOTE — Telephone Encounter (Signed)
Please assist pt with scheduling appt for mouth lesions.

## 2021-07-18 NOTE — Telephone Encounter (Signed)
Patient has thrush and cold sores in her mouth and is requesting magic mouth wash.  [employer] - Jule Ser

## 2021-07-19 ENCOUNTER — Ambulatory Visit (INDEPENDENT_AMBULATORY_CARE_PROVIDER_SITE_OTHER): Payer: Medicare Other | Admitting: Family Medicine

## 2021-07-19 ENCOUNTER — Other Ambulatory Visit: Payer: Self-pay

## 2021-07-19 ENCOUNTER — Encounter: Payer: Self-pay | Admitting: Family Medicine

## 2021-07-19 VITALS — BP 147/63 | HR 61 | Temp 97.9°F | Wt 108.0 lb

## 2021-07-19 DIAGNOSIS — B9689 Other specified bacterial agents as the cause of diseases classified elsewhere: Secondary | ICD-10-CM

## 2021-07-19 DIAGNOSIS — J988 Other specified respiratory disorders: Secondary | ICD-10-CM

## 2021-07-19 MED ORDER — PREDNISONE 20 MG PO TABS: 40.0000 mg | ORAL_TABLET | Freq: Every day | ORAL | 0 refills | Status: DC

## 2021-07-19 MED ORDER — NYSTATIN 100000 UNIT/ML MT SUSP: 5.0000 mL | Freq: Four times a day (QID) | OROMUCOSAL | 0 refills | Status: DC

## 2021-07-19 MED ORDER — AMOXICILLIN-POT CLAVULANATE 875-125 MG PO TABS: 1.0000 | ORAL_TABLET | Freq: Two times a day (BID) | ORAL | 0 refills | Status: DC

## 2021-07-19 NOTE — Telephone Encounter (Signed)
Patient scheduled for today 9/21

## 2021-07-19 NOTE — Progress Notes (Signed)
This visit occurred during the SARS-CoV-2 public health emergency.  Safety protocols were in place, including screening questions prior to the visit, additional usage of staff PPE, and extensive cleaning of exam room while observing appropriate contact time as indicated for disinfecting solutions.    Margaret Holmes , June 21, 1941, 80 y.o., female MRN: [id number] Patient Care Team    Relationship Specialty Notifications Start End  [name], DO PCP - General Family Medicine  04/09/16   Richmond Campbell, MD Consulting Physician Gastroenterology  05/23/16   Salvadore Dom, MD Consulting Physician Obstetrics and Gynecology  05/23/16   Leroy Sea, MD Referring Physician Urology  08/27/16   [facility]  09/01/18     Chief Complaint  Patient presents with   Mouth Lesions    Pt c/o mouth sores x 2 week     Subjective: Pt presents for an OV with complaints of painful mouth sores in the back of her throat and under her tongue.  She states sores have been present for approximately 2-3 weeks.  At the onset of symptoms she also had cough and sinus pressure.  She states she felt it was secondary to mowing the lawn-and was a head cold.  She reports that the cough has changed and now is becoming more productive in nature.  She endorses mild headache as well.  She denies fever and states she always feels chilled.  Depression screen [facility] 2/9 06/01/2021 03/03/2021 06/15/2020 09/01/2018 09/01/2018  Decreased Interest 0 0 0 0 0  Down, Depressed, Hopeless 0 0 1 0 0  PHQ - 2 Score 0 0 1 0 0    Allergies  Allergen Reactions   Diazepam Other (See Comments)    Psychiatric reaction Caused forgetfulness   Codeine Other (See Comments)    Not specified in old records.   Darvocet [Propoxyphene N-Acetaminophen] Other (See Comments)    Not specified in old records   Hydrocodone Other (See Comments)    Not specified in old records   Social History   Social  History Narrative   Married, 3 children, 6 grandchildren, 1 GGc.   Orig from this region.  Drives a school bus (X 31 yrs).   No T/A/Ds.   Caffeine: one cup coffee a day, 3 glasses of sweet tea per day.   No formal exercise.  She is active (NOT sedentary).            Past Medical History:  Diagnosis Date   Adnexal cyst 07/2013   Left-3cm-noted on CT abd/pelv--f/u pelvic u/s did not show anything (L ovary not visualized, likely secondary to bowel gas)-repeat pelvic u/s after 07/2014.   Anemia    Asthma    DDD (degenerative disc disease), cervical    H/O angular cheilitis    Zinc normal, vit B12 low normal (2012)   H/O vitamin D deficiency 2011   Came back to normal range with replacement therapydd   History of adenomatous polyp of colon 2003, 2006, 2013;2016   Repeat 2013 showed tubular adenoma x 3, with no high grade dysplasia.  2016 no polyps-recall 5 yrs   History of hiatal hernia    Hyperlipidemia    per pt report 05/09/12; also on labs 06/2015.  Intolerant of pravachol 02/2016   Hypothyroidism    Kidney infection    Lower leg pain    Bilateral, crampy--ABIs/dopplers NORMAL 04/2013.   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  +internal hem.  Normal ileoscopy.   DILATATION & CURETTAGE/HYSTEROSCOPY WITH MYOSURE N/A 05/28/2016   Procedure: DILATATION & CURETTAGE/HYSTEROSCOPY WITH MYOSURE with ULTRASOUND guidance;  Surgeon: Salvadore Dom, MD;  Location: [facility];  Service: Gynecology;  Laterality: N/A;  Please have ultrasound in room. Follow Silva's first case.    GANGLION CYST EXCISION  2003   Right wrist, with excision of some triquetrum spurs   stent placed right kidney     THYROIDECTOMY, PARTIAL  1972   TONSILLECTOMY AND ADENOIDECTOMY  age 79   TUBAL LIGATION     WRIST SURGERY  2006   For left thumb carpo-metacarpal arthritis   Family History  Problem Relation Age of Onset   Cancer Mother        ovarian and throat cancer   Diabetes Mother    Alcohol abuse Father        alcoholism.  Died age 11   Cancer Maternal Grandmother        breast cancer   Allergies as of 07/19/2021       Reactions   Diazepam Other (See Comments)   Psychiatric reaction Caused forgetfulness   Codeine Other (See Comments)   Not specified in old records.   Darvocet [propoxyphene N-acetaminophen] Other (See Comments)   Not specified in old records   Hydrocodone Other (See Comments)   Not specified in old records        Medication List        Accurate as of July 19, 2021 12:54 PM. If you have any questions, ask your nurse or doctor.          acetaminophen 325 MG tablet Commonly known as: TYLENOL Take 650 mg by mouth every 6 (six) hours as needed.   amoxicillin-clavulanate 875-125 MG tablet Commonly known as: AUGMENTIN Take 1 tablet by mouth 2 (two) times daily. Started by: Howard Pouch, DO   cetirizine 10 MG tablet Commonly known as: ZYRTEC Take 10 mg by mouth daily.   diclofenac sodium 1 % Gel Commonly known as: VOLTAREN Apply over affected area QID RPN   fluticasone 50 MCG/ACT nasal spray Commonly known as: FLONASE Place into the nose.   levothyroxine 50 MCG tablet Commonly known as: Synthroid 1 tab daily p.o. on an empty stomach   mirtazapine 7.5 MG tablet Commonly known as: REMERON Take 1 tablet (7.5 mg total) by mouth at bedtime.   nystatin 100000 UNIT/ML suspension Commonly known as: MYCOSTATIN Take 5 mLs (500,000 Units total) by mouth 4 (four) times daily. Started by: Howard Pouch, DO   predniSONE 20 MG  tablet Commonly known as: DELTASONE Take 2 tablets (40 mg total) by mouth daily with breakfast. Started by: Howard Pouch, DO   ProAir HFA 108 (90 Base) MCG/ACT inhaler Generic drug: albuterol Inhale 1-2 puffs into the lungs every 6 (six) hours as needed. Reported on 05/15/2016   vitamin B-12 1000 MCG tablet Commonly known as: CYANOCOBALAMIN Take 1 tablet (1,000 mcg total) by mouth daily.   VITAMIN C-VITAMIN D-ZINC PO Take by mouth daily.        All  past medical history, surgical history, allergies, family history, immunizations andmedications were updated in the EMR today and reviewed under the history and medication portions of their EMR.     ROS: Negative, with the exception of above mentioned in HPI   Objective:  BP (!) 147/63   Pulse 61   Temp 97.9 F (36.6 C) (Oral)   Wt 108 lb (49 kg)   SpO2 96%   BMI 19.75 kg/m  Body mass index is 19.75 kg/m. Gen: Afebrile. No acute distress. Nontoxic in appearance, thin Caucasian female. HENT: AT. [place]. Bilateral TM visualized without erythema or effusion. MMM.  Small ulcerations along bilateral sides/underneath the tongue.  No erythema at these locations.  Mild white appearance of tongue.  Bilateral nares without erythema, swelling or drainage.. Throat without erythema or exudates.  Postnasal drip present.  Sinus pain present.  Mild cough present. Eyes:Pupils Equal Round Reactive to light, Extraocular movements intact,  Conjunctiva without redness, discharge or icterus. Neck/lymp/endocrine: Supple, left cervical lymphadenopathy x1 present tender at this location.  CV: RRR  Chest: CTAB, no wheeze or crackles. Good air movement, normal resp effort.  Skin: No rashes, purpura or petechiae.  Neuro: Normal gait. PERLA. EOMi. Alert. Oriented x3  Psych: Normal affect, dress and demeanor. Normal speech. Normal thought content and judgment.  No results found. No results found. No results found for this or any previous visit (from the past  24 hour(s)).  Assessment/Plan: KARRAH MANGINI is a 80 y.o. female present for OV for  Bacterial respiratory infection/thrush Rest, hydrate.  mucinex (DM if cough), nettie pot or nasal saline.  Augmentin and prednisone prescribed, take until completed.  Nystatin swish QID x5 days > encouraged her start using new toothbrush. Thrush vs rx to recent dental procedure vs viral cause.  If cough present it can last up to 6-8 weeks.  F/U 2 weeks of not improved.    Reviewed expectations re: course of current medical issues. Discussed self-management of symptoms. Outlined signs and symptoms indicating need for more acute intervention. Patient verbalized understanding and all questions were answered. Patient received an After-Visit Summary.    No orders of the defined types were placed in this encounter.  Meds ordered this encounter  Medications   predniSONE (DELTASONE) 20 MG tablet    Sig: Take 2 tablets (40 mg total) by mouth daily with breakfast.    Dispense:  10 tablet    Refill:  0   nystatin (MYCOSTATIN) 100000 UNIT/ML suspension    Sig: Take 5 mLs (500,000 Units total) by mouth 4 (four) times daily.    Dispense:  60 mL    Refill:  0   amoxicillin-clavulanate (AUGMENTIN) 875-125 MG tablet    Sig: Take 1 tablet by mouth 2 (two) times daily.    Dispense:  20 tablet    Refill:  0   Referral Orders  No referral(s) requested today     Note is dictated utilizing voice recognition software. Although note has been proof read prior to signing, occasional typographical errors still can be missed. If any questions arise, please do not hesitate to call for verification.   electronically signed by:  Howard Pouch, DO  [place]

## 2021-07-19 NOTE — Patient Instructions (Signed)
  Great to see you today.  I have called in prednisone, antibiotic and mouth rinse.

## 2021-07-27 ENCOUNTER — Telehealth: Payer: Self-pay

## 2021-07-27 NOTE — Telephone Encounter (Signed)
LVM for pt to CB regarding med request.  Note: Would like to know if pt is currently out of medication and if sx are improving. Pt is to follow the following directions: Rest, hydrate.  mucinex (DM if cough), nettie pot or nasal saline.  Augmentin and prednisone prescribed, take until completed.  Nystatin swish QID x5 days > encouraged her start using new toothbrush. Thrush vs rx to recent dental procedure vs viral cause.  If cough present it can last up to 6-8 weeks.  F/U 2 weeks if not improved

## 2021-07-27 NOTE — Telephone Encounter (Signed)
Spoke with pt and pt states that she is completely out of medication. Pt was instructed to had only use it for 5 days. Pt was informed of following instructions and to CB in a week if not improved.

## 2021-07-27 NOTE — Telephone Encounter (Signed)
  Encourage patient to contact the pharmacy for refills or they can request refills through [place]:  07/19/21  NEXT APPOINTMENT DATE:  MEDICATION:nystatin (MYCOSTATIN) 100000 UNIT/ML suspension  Is the patient out of medication? yes  PHARMACY: [facility] [id number] - [place], [address]  COMMENTS: Patient states she still is broke out in her mouth more on the left side and underneath her tongue.   Let patient know to contact pharmacy at the end of the day to make sure medication is ready.  Please notify patient to allow 48-72 hours to process

## 2021-08-16 ENCOUNTER — Ambulatory Visit (INDEPENDENT_AMBULATORY_CARE_PROVIDER_SITE_OTHER): Payer: Medicare Other | Admitting: Family Medicine

## 2021-08-16 ENCOUNTER — Encounter: Payer: Self-pay | Admitting: Family Medicine

## 2021-08-16 ENCOUNTER — Other Ambulatory Visit: Payer: Self-pay

## 2021-08-16 VITALS — BP 126/75 | HR 60 | Temp 98.0°F | Wt 109.6 lb

## 2021-08-16 DIAGNOSIS — B370 Candidal stomatitis: Secondary | ICD-10-CM

## 2021-08-16 DIAGNOSIS — K069 Disorder of gingiva and edentulous alveolar ridge, unspecified: Secondary | ICD-10-CM

## 2021-08-16 DIAGNOSIS — K029 Dental caries, unspecified: Secondary | ICD-10-CM

## 2021-08-16 MED ORDER — FLUCONAZOLE 150 MG PO TABS: ORAL_TABLET | ORAL | 0 refills | Status: DC

## 2021-08-16 MED ORDER — CHLORHEXIDINE GLUCONATE 0.12 % MT SOLN: 15.0000 mL | Freq: Two times a day (BID) | OROMUCOSAL | 1 refills | Status: DC

## 2021-08-16 NOTE — Patient Instructions (Signed)
  Use the new mouthwash twice a day. Take the diflucan pill when you pick it up and then take second pill in 3 days.   Make sure to make appt with dentist so they can check your gums and tongue.

## 2021-08-16 NOTE — Progress Notes (Signed)
This visit occurred during the SARS-CoV-2 public health emergency.  Safety protocols were in place, including screening questions prior to the visit, additional usage of staff PPE, and extensive cleaning of exam room while observing appropriate contact time as indicated for disinfecting solutions.    Margaret Holmes , 10/15/1941, 80 y.o., female MRN: [id number] Patient Care Team    Relationship Specialty Notifications Start End  [name], DO PCP - General Family Medicine  04/09/16   Richmond Campbell, MD Consulting Physician Gastroenterology  05/23/16   Salvadore Dom, MD Consulting Physician Obstetrics and Gynecology  05/23/16   Leroy Sea, MD Referring Physician Urology  08/27/16   [facility]  09/01/18     Chief Complaint  Patient presents with   Mouth Lesions    Swelling since last night. Has been using nasal spray and orajel mouth wash; left side of nose started bleeding.      Subjective: Pt presents for an OV with complaints of mild swelling on her tongue and white plaque on her tongue.  She was treated with nystatin swish, Augmentin and prednisone 4 weeks ago for similar presentation with additional sinusitis infection signs.  She states that her symptoms almost completely resolved, but since the weight on her tongue and the tongue soreness has started to return.  She did change her toothbrush multiple times.  She is established with her dental team and having dental work currently.  She does have gum disease.  Depression screen [facility] 2/9 06/01/2021 03/03/2021 06/15/2020 09/01/2018 09/01/2018  Decreased Interest 0 0 0 0 0  Down, Depressed, Hopeless 0 0 1 0 0  PHQ - 2 Score 0 0 1 0 0    Allergies  Allergen Reactions   Diazepam Other (See Comments)    Psychiatric reaction Caused forgetfulness   Codeine Other (See Comments)    Not specified in old records.   Darvocet [Propoxyphene N-Acetaminophen] Other (See Comments)    Not  specified in old records   Hydrocodone Other (See Comments)    Not specified in old records   Social History   Social History Narrative   Married, 3 children, 6 grandchildren, 1 GGc.   Orig from this region.  Drives a school bus (X 31 yrs).   No T/A/Ds.   Caffeine: one cup coffee a day, 3 glasses of sweet tea per day.   No formal exercise.  She is active (NOT sedentary).            Past Medical History:  Diagnosis Date   Adnexal cyst 07/2013   Left-3cm-noted on CT abd/pelv--f/u pelvic u/s did not show anything (L ovary not visualized, likely secondary to bowel gas)-repeat pelvic u/s after 07/2014.   Anemia    Asthma    DDD (degenerative disc disease), cervical    H/O angular cheilitis    Zinc normal, vit B12 low normal (2012)   H/O vitamin D deficiency 2011   Came back to normal range with replacement therapydd   History of adenomatous polyp of colon 2003, 2006, 2013;2016   Repeat 2013 showed tubular adenoma x 3, with no high grade dysplasia.  2016 no polyps-recall 5 yrs   History of hiatal hernia    Hyperlipidemia    per pt report 05/09/12; also on labs 06/2015.  Intolerant of pravachol 02/2016   Hypothyroidism    Kidney infection    Lower leg pain    Bilateral, crampy--ABIs/dopplers NORMAL 04/2013.   Osteoarthritis  of left wrist 11/2014   and left elbow (ortho referral 11/2014)   Paresthesia 06/15/2020   PONV (postoperative nausea and vomiting)    "jerks" afterward   Shortness of breath dyspnea    Past Surgical History:  Procedure Laterality Date   BAND HEMORRHOIDECTOMY     CATARACT EXTRACTION W/ INTRAOCULAR LENS IMPLANT Left 10/27/2019   CHOLECYSTECTOMY  1980s   COLONOSCOPY W/ BIOPSIES AND POLYPECTOMY  06/02/12;05/30/15   No polyps 2016-recall 5 yrs.  +internal hem.  Normal ileoscopy.   DILATATION & CURETTAGE/HYSTEROSCOPY WITH MYOSURE N/A 05/28/2016   Procedure: DILATATION & CURETTAGE/HYSTEROSCOPY WITH MYOSURE with ULTRASOUND guidance;  Surgeon: Salvadore Dom, MD;   Location: [facility];  Service: Gynecology;  Laterality: N/A;  Please have ultrasound in room. Follow Silva's first case.   GANGLION CYST EXCISION  2003   Right wrist, with excision of some triquetrum spurs   stent placed right kidney     THYROIDECTOMY, PARTIAL  1972   TONSILLECTOMY AND ADENOIDECTOMY  age 7   TUBAL LIGATION     WRIST SURGERY  2006   For left thumb carpo-metacarpal arthritis   Family History  Problem Relation Age of Onset   Cancer Mother        ovarian and throat cancer   Diabetes Mother    Alcohol abuse Father        alcoholism.  Died age 7   Cancer Maternal Grandmother        breast cancer   Allergies as of 08/16/2021       Reactions   Diazepam Other (See Comments)   Psychiatric reaction Caused forgetfulness   Codeine Other (See Comments)   Not specified in old records.   Darvocet [propoxyphene N-acetaminophen] Other (See Comments)   Not specified in old records   Hydrocodone Other (See Comments)   Not specified in old records        Medication List        Accurate as of August 16, 2021  5:04 PM. If you have any questions, ask your nurse or doctor.          STOP taking these medications    nystatin 100000 UNIT/ML suspension Commonly known as: MYCOSTATIN Stopped by: Howard Pouch, DO   predniSONE 20 MG tablet Commonly known as: DELTASONE Stopped by: Howard Pouch, DO       TAKE these medications    acetaminophen 325 MG tablet Commonly known as: TYLENOL Take 650 mg by mouth every 6 (six) hours as needed.   amoxicillin-clavulanate 875-125 MG tablet Commonly known as: AUGMENTIN Take 1 tablet by mouth 2 (two) times daily.   cetirizine 10 MG tablet Commonly known as: ZYRTEC Take 10 mg by mouth daily.   chlorhexidine 0.12 % solution Commonly known as: PERIDEX Use as directed 15 mLs in the mouth or throat 2 (two) times daily. Started by: Howard Pouch, DO   diclofenac sodium 1 % Gel Commonly known as: VOLTAREN Apply over affected  area QID RPN   fluconazole 150 MG tablet Commonly known as: DIFLUCAN One tab today and then repeat in 3 days. Started by: Howard Pouch, DO   fluticasone 50 MCG/ACT nasal spray Commonly known as: FLONASE Place into the nose.   levothyroxine 50 MCG tablet Commonly known as: Synthroid 1 tab daily p.o. on an empty stomach   mirtazapine 7.5 MG tablet Commonly known as: REMERON Take 1 tablet (7.5 mg total) by mouth at bedtime.   ProAir HFA 108 (90 Base) MCG/ACT inhaler Generic drug: albuterol  Inhale 1-2 puffs into the lungs every 6 (six) hours as needed. Reported on 05/15/2016   vitamin B-12 1000 MCG tablet Commonly known as: CYANOCOBALAMIN Take 1 tablet (1,000 mcg total) by mouth daily.   VITAMIN C-VITAMIN D-ZINC PO Take by mouth daily.        All past medical history, surgical history, allergies, family history, immunizations andmedications were updated in the EMR today and reviewed under the history and medication portions of their EMR.     ROS: Negative, with the exception of above mentioned in HPI   Objective:  BP 126/75   Pulse 60   Temp 98 F (36.7 C) (Oral)   Wt 109 lb 9.6 oz (49.7 kg)   SpO2 95%   BMI 20.05 kg/m  Body mass index is 20.05 kg/m. Gen: Afebrile. No acute distress.  Toxic, pleasant female. HENT: AT. [place].MMM. Bilateral nares without erythema or drainage. Throat without erythema or exudates.  No obvious swelling on her tongue, mild white plaque-like lesions on surface of tongue.  No obvious erythema.  Decay noted. Eyes:Pupils Equal Round Reactive to light, Extraocular movements intact,  Conjunctiva without redness, discharge or icterus. Neck/lymp/endocrine: Supple, mild left sublingual lymphadenopathy present Neuro: Normal gait. PERLA. EOMi. Alert. Oriented x3 Psych: Normal affect, dress and demeanor. Normal speech. Normal thought content and judgment..    No results found. No results found. No results found for this or any previous visit (from  the past 24 hour(s)).  Assessment/Plan: Margaret Holmes is a 80 y.o. female present for OV for  Gum disease/ thrush/tooth decay Possible mild thrush, although this is not classic presentation for thrush.  Since she did say it had improved with nystatin swish, I will prescribe Diflucan 1 tab today and repeat dose in 3 days. Suspect symptoms may be more secondary to her gum disease and tooth decay.  Start Peridex swish twice daily and follow-up with her dental team.  Reviewed expectations re: course of current medical issues. Discussed self-management of symptoms. Outlined signs and symptoms indicating need for more acute intervention. Patient verbalized understanding and all questions were answered. Patient received an After-Visit Summary.    No orders of the defined types were placed in this encounter.  Meds ordered this encounter  Medications   fluconazole (DIFLUCAN) 150 MG tablet    Sig: One tab today and then repeat in 3 days.    Dispense:  2 tablet    Refill:  0   chlorhexidine (PERIDEX) 0.12 % solution    Sig: Use as directed 15 mLs in the mouth or throat 2 (two) times daily.    Dispense:  120 mL    Refill:  1    Referral Orders  No referral(s) requested today     Note is dictated utilizing voice recognition software. Although note has been proof read prior to signing, occasional typographical errors still can be missed. If any questions arise, please do not hesitate to call for verification.   electronically signed by:  Howard Pouch, DO  [place]

## 2021-10-24 DIAGNOSIS — Z961 Presence of intraocular lens: Secondary | ICD-10-CM | POA: Diagnosis not present

## 2021-10-24 DIAGNOSIS — H524 Presbyopia: Secondary | ICD-10-CM | POA: Diagnosis not present

## 2022-01-01 ENCOUNTER — Telehealth: Payer: Self-pay

## 2022-01-01 NOTE — Telephone Encounter (Signed)
We will discuss the symptoms she is having that has her concerned. We will then perform any labs that are appropriate for the work up of those symptoms, including lupus if appropriate.  ? ? ?

## 2022-01-01 NOTE — Telephone Encounter (Signed)
Patient thinks she may have lupus, and feels her thyroid is giving her some issues. ?Before making appt, she wanted to make sure Dr. Raoul Pitch would check her for Lupus. ? ?Please call [phone] ?

## 2022-01-01 NOTE — Telephone Encounter (Signed)
Please advise 

## 2022-01-01 NOTE — Telephone Encounter (Signed)
Spoke with pt providers instructions.   ?

## 2022-01-08 ENCOUNTER — Ambulatory Visit: Payer: Medicare PPO | Admitting: Family Medicine

## 2022-01-08 ENCOUNTER — Encounter: Payer: Self-pay | Admitting: Family Medicine

## 2022-01-08 ENCOUNTER — Telehealth: Payer: Self-pay | Admitting: Family Medicine

## 2022-01-08 ENCOUNTER — Other Ambulatory Visit: Payer: Self-pay

## 2022-01-08 VITALS — BP 123/68 | HR 64 | Temp 97.9°F | Ht 62.0 in | Wt 117.0 lb

## 2022-01-08 DIAGNOSIS — R350 Frequency of micturition: Secondary | ICD-10-CM | POA: Diagnosis not present

## 2022-01-08 DIAGNOSIS — E034 Atrophy of thyroid (acquired): Secondary | ICD-10-CM

## 2022-01-08 LAB — T4, FREE: Free T4: 0.81 ng/dL (ref 0.60–1.60)

## 2022-01-08 LAB — TSH: TSH: 12.5 u[IU]/mL — ABNORMAL HIGH (ref 0.35–5.50)

## 2022-01-08 NOTE — Progress Notes (Signed)
This visit occurred during the SARS-CoV-2 public health emergency.  Safety protocols were in place, including screening questions prior to the visit, additional usage of staff PPE, and extensive cleaning of exam room while observing appropriate contact time as indicated for disinfecting solutions.    Margaret Holmes , 06/04/1941, 81 y.o., female MRN: [id number] Patient Care Team    Relationship Specialty Notifications Start End  Natalia Leatherwood, DO PCP - General Family Medicine  04/09/16   Sharrell Ku, MD Consulting Physician Gastroenterology  05/23/16   Romualdo Bolk, MD Consulting Physician Obstetrics and Gynecology  05/23/16   Theodosia Blender, MD Referring Physician Urology  08/27/16   [facility]  09/01/18     Chief Complaint  Patient presents with   Hematuria   Hypothyroidism     Subjective: Pt presents for an OV with complaints of urinary frequency that started approximately 3 weeks ago.  She states she saw small amount of blood in her urine at that time as well.  She started taking Azo and wearing a pad and she had seen improvement in her symptoms.  She has not taken Azo in approximately 5 to 7 days now.  She denies any fever, chills, nausea or vomit.  Patient reports she wants her thyroid tested today because she is having decreased vision and neck discomfort.  She feels she has "just gone downhill "since thyroid medication was adjusted 06/15/2021 for being mildly oversupplemented.  She has had 2 normal thyroid levels in September 2021 and in August 2022 since that time.  He reports compliance with levothyroxine 50 mcg daily.  Depression screen [facility] 2/9 01/08/2022 06/01/2021 03/03/2021 06/15/2020 09/01/2018  Decreased Interest 0 0 0 0 0  Down, Depressed, Hopeless 0 0 0 1 0  PHQ - 2 Score 0 0 0 1 0    Allergies  Allergen Reactions   Diazepam Other (See Comments)    Psychiatric reaction Caused forgetfulness   Codeine Other (See  Comments)    Not specified in old records.   Darvocet [Propoxyphene N-Acetaminophen] Other (See Comments)    Not specified in old records   Hydrocodone Other (See Comments)    Not specified in old records   Social History   Social History Narrative   Married, 3 children, 6 grandchildren, 1 GGc.   Orig from this region.  Drives a school bus (X 31 yrs).   No T/A/Ds.   Caffeine: one cup coffee a day, 3 glasses of sweet tea per day.   No formal exercise.  She is active (NOT sedentary).            Past Medical History:  Diagnosis Date   Adnexal cyst 07/2013   Left-3cm-noted on CT abd/pelv--f/u pelvic u/s did not show anything (L ovary not visualized, likely secondary to bowel gas)-repeat pelvic u/s after 07/2014.   Anemia    Asthma    DDD (degenerative disc disease), cervical    H/O angular cheilitis    Zinc normal, vit B12 low normal (2012)   H/O vitamin D deficiency 2011   Came back to normal range with replacement therapydd   History of adenomatous polyp of colon 2003, 2006, 2013;2016   Repeat 2013 showed tubular adenoma x 3, with no high grade dysplasia.  2016 no polyps-recall 5 yrs   History of hiatal hernia    Hyperlipidemia    per pt report 05/09/12; also on labs 06/2015.  Intolerant of pravachol 02/2016  Hypothyroidism    Kidney infection    Lower leg pain    Bilateral, crampy--ABIs/dopplers NORMAL 04/2013.   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  +internal hem.  Normal ileoscopy.   DILATATION & CURETTAGE/HYSTEROSCOPY WITH MYOSURE N/A 05/28/2016    Procedure: DILATATION & CURETTAGE/HYSTEROSCOPY WITH MYOSURE with ULTRASOUND guidance;  Surgeon: Romualdo Bolk, MD;  Location: [facility];  Service: Gynecology;  Laterality: N/A;  Please have ultrasound in room. Follow Silva's first case.   GANGLION CYST EXCISION  2003   Right wrist, with excision of some triquetrum spurs   stent placed right kidney     THYROIDECTOMY, PARTIAL  1972   TONSILLECTOMY AND ADENOIDECTOMY  age 62   TUBAL LIGATION     WRIST SURGERY  2006   For left thumb carpo-metacarpal arthritis   Family History  Problem Relation Age of Onset   Cancer Mother        ovarian and throat cancer   Diabetes Mother    Alcohol abuse Father        alcoholism.  Died age 28   Cancer Maternal Grandmother        breast cancer   Allergies as of 01/08/2022       Reactions   Diazepam Other (See Comments)   Psychiatric reaction Caused forgetfulness   Codeine Other (See Comments)   Not specified in old records.   Darvocet [propoxyphene N-acetaminophen] Other (See Comments)   Not specified in old records   Hydrocodone Other (See Comments)   Not specified in old records        Medication List        Accurate as of January 08, 2022 11:12 AM. If you have any questions, ask your nurse or doctor.          STOP taking these medications    amoxicillin-clavulanate 875-125 MG tablet Commonly known as: AUGMENTIN Stopped by: Felix Pacini, DO   cetirizine 10 MG tablet Commonly known as: ZYRTEC Stopped by: Felix Pacini, DO   fluconazole 150 MG tablet Commonly known as: DIFLUCAN Stopped by: Felix Pacini, DO   mirtazapine 7.5 MG tablet Commonly known as: REMERON Stopped by: Felix Pacini, DO   ProAir HFA 108 (90 Base) MCG/ACT inhaler Generic drug: albuterol Stopped by: Felix Pacini, DO       TAKE these medications    acetaminophen 325 MG tablet Commonly known as: TYLENOL Take 650 mg by mouth every 6 (six) hours as needed.   chlorhexidine 0.12 % solution Commonly  known as: PERIDEX Use as directed 15 mLs in the mouth or throat 2 (two) times daily.   diclofenac sodium 1 % Gel Commonly known as: VOLTAREN Apply over affected area QID RPN   fluticasone 50 MCG/ACT nasal spray Commonly known as: FLONASE Place into the nose.   levothyroxine 50 MCG tablet Commonly known as: Synthroid 1 tab daily p.o. on an empty stomach   vitamin B-12 1000 MCG tablet Commonly known as: CYANOCOBALAMIN Take 1 tablet (1,000 mcg total) by mouth daily.   VITAMIN C-VITAMIN D-ZINC PO Take by  mouth daily.        All past medical history, surgical history, allergies, family history, immunizations andmedications were updated in the EMR today and reviewed under the history and medication portions of their EMR.     ROS Negative, with the exception of above mentioned in HPI   Objective:  BP 123/68   Pulse 64   Temp 97.9 F (36.6 C) (Oral)   Ht 5\' 2"  (1.575 m)   Wt 117 lb (53.1 kg)   SpO2 97%   BMI 21.40 kg/m  Body mass index is 21.4 kg/m. Physical Exam Vitals and nursing note reviewed.  Constitutional:      General: She is not in acute distress.    Appearance: Normal appearance. She is normal weight. She is not ill-appearing or toxic-appearing.  Eyes:     Extraocular Movements: Extraocular movements intact.     Conjunctiva/sclera: Conjunctivae normal.     Pupils: Pupils are equal, round, and reactive to light.  Neck:     Comments: No thyromegaly or nodules.  Mild cervical adenopathy present. Cardiovascular:     Rate and Rhythm: Normal rate.     Heart sounds: No murmur heard. Abdominal:     General: Abdomen is flat. There is no distension.     Tenderness: There is no abdominal tenderness.  Musculoskeletal:     Cervical back: Normal range of motion and neck supple. No tenderness.     Right lower leg: No edema.     Left lower leg: No edema.  Neurological:     Mental Status: She is alert and oriented to person, place, and time. Mental status is at  baseline.  Psychiatric:        Mood and Affect: Mood normal.        Behavior: Behavior normal.        Thought Content: Thought content normal.        Judgment: Judgment normal.     No results found. No results found. No results found for this or any previous visit (from the past 24 hour(s)).  Assessment/Plan: SATIA DOUTHITT is a 81 y.o. female present for OV for  Hypothyroidism due to acquired atrophy of thyroid Patient is requesting levothyroxine prescription to be printed after results.  She will come pick the medication up. - TSH - T4, free  Urinary frequency Patient requesting urinalysis today.  Symptoms have greatly improved with the use of Azo.  Will ensure hematuria or infection does not remain. - Urinalysis w microscopic + reflex cultur  Reviewed expectations re: course of current medical issues. Discussed self-management of symptoms. Outlined signs and symptoms indicating need for more acute intervention. Patient verbalized understanding and all questions were answered. Patient received an After-Visit Summary.    Orders Placed This Encounter  Procedures   Urinalysis w microscopic + reflex cultur   TSH   T4, free   No orders of the defined types were placed in this encounter.  Referral Orders  No referral(s) requested today     Note is dictated utilizing voice recognition software. Although note has been proof read prior to signing, occasional typographical errors still can be missed. If any questions arise, please do not hesitate to call for verification.   electronically signed by:  Felix Pacini, DO  [facility] - OR

## 2022-01-09 ENCOUNTER — Telehealth: Payer: Self-pay | Admitting: Family Medicine

## 2022-01-09 LAB — URINALYSIS W MICROSCOPIC + REFLEX CULTURE
Bacteria, UA: NONE SEEN /HPF
Bilirubin Urine: NEGATIVE
Glucose, UA: NEGATIVE
Hgb urine dipstick: NEGATIVE
Hyaline Cast: NONE SEEN /LPF
Ketones, ur: NEGATIVE
Leukocyte Esterase: NEGATIVE
Nitrites, Initial: NEGATIVE
Protein, ur: NEGATIVE
RBC / HPF: NONE SEEN /HPF (ref 0–2)
Specific Gravity, Urine: 1.009 (ref 1.001–1.035)
Squamous Epithelial / HPF: NONE SEEN /HPF (ref <=5)
WBC, UA: NONE SEEN /HPF (ref 0–5)
pH: 6.5 (ref 5.0–8.0)

## 2022-01-09 LAB — NO CULTURE INDICATED

## 2022-01-09 MED ORDER — LEVOTHYROXINE SODIUM 50 MCG PO TABS: ORAL_TABLET | ORAL | 3 refills | Status: DC

## 2022-01-09 NOTE — Telephone Encounter (Signed)
Please call patient: ?Urinalysis is normal no signs of infection or blood. ?Her thyroid is now under supplemented, where prior it was oversupplemented. ?  -I have kept the dose per pill at the same and attempt to try to keep the easiest regimen for for her.  Since she is requiring a dose that is in between doses that are manufactured, she will need to take levothyroxine 1 tab (50 mcg) daily Monday through Friday, and 1.5 tabs (75 mcg) on Saturday and Sunday.  ? ?Did (for her and placed in [place] work basket.  She wants to pick up prescription so she can shop for cheapest location. ?Please place in an envelope out from so she may pick up. ?

## 2022-01-09 NOTE — Telephone Encounter (Signed)
LVM for pt to CB regarding results.  

## 2022-01-09 NOTE — Telephone Encounter (Signed)
Spoke with pt regarding labs and instructions.   

## 2022-02-05 NOTE — Telephone Encounter (Signed)
ERROR

## 2022-02-14 ENCOUNTER — Encounter: Payer: Self-pay | Admitting: Family Medicine

## 2022-02-14 ENCOUNTER — Ambulatory Visit: Payer: Medicare PPO | Admitting: Family Medicine

## 2022-02-14 VITALS — BP 115/57 | HR 70 | Temp 97.8°F | Ht 62.0 in | Wt 116.0 lb

## 2022-02-14 DIAGNOSIS — S4990XA Unspecified injury of shoulder and upper arm, unspecified arm, initial encounter: Secondary | ICD-10-CM | POA: Diagnosis not present

## 2022-02-14 DIAGNOSIS — S41112A Laceration without foreign body of left upper arm, initial encounter: Secondary | ICD-10-CM

## 2022-02-14 NOTE — Progress Notes (Signed)
? ? ? ?This visit occurred during the SARS-CoV-2 public health emergency.  Safety protocols were in place, including screening questions prior to the visit, additional usage of staff PPE, and extensive cleaning of exam room while observing appropriate contact time as indicated for disinfecting solutions.  ? ? ?Margaret Holmes , 30-Oct-1940, 81 y.o., female ?MRN: [id number] ?Patient Care Team  ?  Relationship Specialty Notifications Start End  ?[name], DO PCP - General Family Medicine  04/09/16   ?Richmond Campbell, MD Consulting Physician Gastroenterology  05/23/16   ?Salvadore Dom, MD Consulting Physician Obstetrics and Gynecology  05/23/16   ?Leroy Sea, MD Referring Physician Urology  08/27/16   ?[facility]  09/01/18   ? ? ?Chief Complaint  ?Patient presents with  ? Laceration  ?  ED f/u  ? ?  ?Subjective: Pt presents for an OV to follow up on ED visit at [facility] system for laceration of her arm.  Tdap up to date.  Patient reports she was mowing her lawn when her arm got caught on a recently cut branch off of a bush.  She received a V-shaped laceration.  She presented to the emergency room in which they use Steri-Strips for closure and treated her with Keflex 500 mg twice daily and Tylenol.  Patient reports it is healing very well.  She denies any redness or drainage.  She states it still little sore but getting much better. ? ? ?  01/08/2022  ? 10:06 AM 06/01/2021  ?  8:06 AM 03/03/2021  ?  2:18 PM 06/15/2020  ?  8:27 AM 09/01/2018  ?  8:48 AM  ?Depression screen PHQ 2/9  ?Decreased Interest 0 0 0 0 0  ?Down, Depressed, Hopeless 0 0 0 1 0  ?PHQ - 2 Score 0 0 0 1 0  ? ? ?Allergies  ?Allergen Reactions  ? Diazepam Other (See Comments)  ?  Psychiatric reaction ?Caused forgetfulness  ? Codeine Other (See Comments)  ?  Not specified in old records.  ? Darvocet [Propoxyphene N-Acetaminophen] Other (See Comments)  ?  Not specified in old records  ? Hydrocodone Other (See  Comments)  ?  Not specified in old records  ? ?Social History  ? ?Social History Narrative  ? Married, 3 children, 6 grandchildren, 1 GGc.  ? Orig from this region.  Drives a school bus (X 31 yrs).  ? No T/A/Ds.  ? Caffeine: one cup coffee a day, 3 glasses of sweet tea per day.  ? No formal exercise.  She is active (NOT sedentary).  ?   ?   ?   ? ?Past Medical History:  ?Diagnosis Date  ? Adnexal cyst 07/2013  ? Left-3cm-noted on CT abd/pelv--f/u pelvic u/s did not show anything (L ovary not visualized, likely secondary to bowel gas)-repeat pelvic u/s after 07/2014.  ? Anemia   ? Asthma   ? DDD (degenerative disc disease), cervical   ? H/O angular cheilitis   ? Zinc normal, vit B12 low normal (2012)  ? H/O vitamin D deficiency 2011  ? Came back to normal range with replacement therapydd  ? History of adenomatous polyp of colon 2003, 2006, 2013;2016  ? Repeat 2013 showed tubular adenoma x 3, with no high grade dysplasia.  2016 no polyps-recall 5 yrs  ? History of hiatal hernia   ? Hyperlipidemia   ? per pt report 05/09/12; also on labs 06/2015.  Intolerant of pravachol 02/2016  ? Hypothyroidism   ?  Kidney infection   ? Lower leg pain   ? Bilateral, crampy--ABIs/dopplers NORMAL 04/2013.  ? 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 and left elbow (ortho referral 11/2014)  ? Paresthesia 06/15/2020  ? PONV (postoperative nausea and vomiting)   ? "jerks" afterward  ? Shortness of breath dyspnea   ? ?Past Surgical History:  ?Procedure Laterality Date  ? BAND HEMORRHOIDECTOMY    ? CATARACT EXTRACTION W/ INTRAOCULAR LENS IMPLANT Left 10/27/2019  ? CHOLECYSTECTOMY  1980s  ? COLONOSCOPY W/ BIOPSIES AND POLYPECTOMY  06/02/12;05/30/15  ? No polyps 2016-recall 5 yrs.  +internal hem.  Normal ileoscopy.  ? DILATATION & CURETTAGE/HYSTEROSCOPY WITH MYOSURE N/A 05/28/2016  ? Procedure: DILATATION & CURETTAGE/HYSTEROSCOPY WITH MYOSURE with ULTRASOUND guidance;  Surgeon: Salvadore Dom, MD;  Location: [facility];  Service: Gynecology;  Laterality:  N/A;  Please have ultrasound in room. Follow Silva's first case.  ? GANGLION CYST EXCISION  2003  ? Right wrist, with excision of some triquetrum spurs  ? stent placed right kidney    ? THYROIDECTOMY, PARTIAL  1972  ? TONSILLECTOMY AND ADENOIDECTOMY  age 56  ? TUBAL LIGATION    ? WRIST SURGERY  2006  ? For left thumb carpo-metacarpal arthritis  ? ?Family History  ?Problem Relation Age of Onset  ? Cancer Mother   ?     ovarian and throat cancer  ? Diabetes Mother   ? Alcohol abuse Father   ?     alcoholism.  Died age 70  ? Cancer Maternal Grandmother   ?     breast cancer  ? ?Allergies as of 02/14/2022   ? ?   Reactions  ? Diazepam Other (See Comments)  ? Psychiatric reaction ?Caused forgetfulness  ? Codeine Other (See Comments)  ? Not specified in old records.  ? Darvocet [propoxyphene N-acetaminophen] Other (See Comments)  ? Not specified in old records  ? Hydrocodone Other (See Comments)  ? Not specified in old records  ? ?  ? ?  ?Medication List  ?  ? ?  ? Accurate as of February 14, 2022  4:31 PM. If you have any questions, ask your nurse or doctor.  ?  ?  ? ?  ? ?acetaminophen 325 MG tablet ?Commonly known as: TYLENOL ?Take 650 mg by mouth every 6 (six) hours as needed. ?  ?cephALEXin 500 MG capsule ?Commonly known as: KEFLEX ?Take 500 mg by mouth 2 (two) times daily. ?  ?chlorhexidine 0.12 % solution ?Commonly known as: PERIDEX ?Use as directed 15 mLs in the mouth or throat 2 (two) times daily. ?  ?diclofenac sodium 1 % Gel ?Commonly known as: VOLTAREN ?Apply over affected area QID RPN ?  ?fluticasone 50 MCG/ACT nasal spray ?Commonly known as: FLONASE ?Place into the nose. ?  ?levothyroxine 50 MCG tablet ?Commonly known as: Synthroid ?1 tab  p.o. on an empty stomach Monday-Friday, and 1.5 tabs Saturday and Sunday ?  ?vitamin B-12 1000 MCG tablet ?Commonly known as: CYANOCOBALAMIN ?Take 1 tablet (1,000 mcg total) by mouth daily. ?  ?VITAMIN C-VITAMIN D-ZINC PO ?Take by mouth daily. ?  ? ?  ? ? ?All past medical  history, surgical history, allergies, family history, immunizations andmedications were updated in the EMR today and reviewed under the history and medication portions of their EMR.    ? ?ROS ?Negative, with the exception of above mentioned in HPI ? ? ?Objective:  ?BP (!) 115/57   Pulse 70   Temp 97.8 ?F (36.6 ?C) (Oral)  Ht '5\' 2"'$  (1.575 m)   Wt 116 lb (52.6 kg)   SpO2 96%   BMI 21.22 kg/m?  ?Body mass index is 21.22 kg/m?Marland Kitchen ?Physical Exam ?Skin: ?   General: Skin is warm and dry.  ?   Findings: Bruising, signs of injury and laceration (V-shaped laceration of left forearm well approximated with Steri-Strip closure.) present. No abscess, erythema or rash.  ?   Comments: No drainage, no bleeding.  ? ? ?No results found. ?No results found. ?No results found for this or any previous visit (from the past 24 hour(s)). ? ?Assessment/Plan: ?SOO STEELMAN is a 81 y.o. female present for OV for  ?Injury of upper extremity, unspecified laterality, initial encounter/Laceration of left upper extremity, initial encounter ?Wound is well approximated with Steri-Strips.  Patient was educated on keeping Steri-Strips in place letting them fall off on their own. ?Encouraged her to keep wound clean and dry.  No standing water. ?Telfa placed over Steri-Strips and Coban dressing applied for her today. ?Continue antibiotics as prescribed, does not appear infected today. ?Follow-up as needed ? ?Reviewed expectations re: course of current medical issues. ?Discussed self-management of symptoms. ?Outlined signs and symptoms indicating need for more acute intervention. ?Patient verbalized understanding and all questions were answered. ?Patient received an After-Visit Summary. ? ? ? ?No orders of the defined types were placed in this encounter. ? ?No orders of the defined types were placed in this encounter. ? ?Referral Orders  ?No referral(s) requested today  ? ? ? ?Note is dictated utilizing voice recognition software. Although note  has been proof read prior to signing, occasional typographical errors still can be missed. If any questions arise, please do not hesitate to call for verification.  ? ?electronically signed by: ? ?Howard Pouch, DO

## 2022-02-27 ENCOUNTER — Telehealth: Payer: Self-pay

## 2022-02-27 NOTE — Telephone Encounter (Signed)
Pt was advised strips should fall off on their own, no standing water, continue abx as directed. Last seen 02/14/22 for ED f/u with PCP and same instructions given.  ?

## 2022-02-27 NOTE — Telephone Encounter (Signed)
LVM for pt to CB regarding steri strips.  ?

## 2022-02-27 NOTE — Telephone Encounter (Signed)
Returning Margaret Holmes's call, Adolm Joseph unavailable at the time, in patient care. Patient was driving when call came in before. ? ?Please call back when available. She is now home.  ?

## 2022-02-27 NOTE — Telephone Encounter (Signed)
Whats to know how long "stitches" are to be in here arm.  She was seen at ED 3 weeks ago.  She said they are not stitches but didn't know how to explain what she has in her arm ? ?Please call [phone].  High priority please ?

## 2022-04-17 ENCOUNTER — Ambulatory Visit: Payer: Medicare Other

## 2022-05-10 ENCOUNTER — Ambulatory Visit: Payer: Medicare PPO | Admitting: Family Medicine

## 2022-05-10 ENCOUNTER — Encounter: Payer: Self-pay | Admitting: Family Medicine

## 2022-05-10 VITALS — BP 110/55 | HR 64 | Temp 97.8°F | Ht 62.0 in | Wt 114.0 lb

## 2022-05-10 DIAGNOSIS — W57XXXA Bitten or stung by nonvenomous insect and other nonvenomous arthropods, initial encounter: Secondary | ICD-10-CM | POA: Diagnosis not present

## 2022-05-10 DIAGNOSIS — S30860A Insect bite (nonvenomous) of lower back and pelvis, initial encounter: Secondary | ICD-10-CM

## 2022-05-10 MED ORDER — CLOTRIMAZOLE-BETAMETHASONE 1-0.05 % EX CREA: 1.0000 | TOPICAL_CREAM | Freq: Every day | CUTANEOUS | 0 refills | Status: DC

## 2022-05-10 MED ORDER — DOXYCYCLINE HYCLATE 100 MG PO TABS: 100.0000 mg | ORAL_TABLET | Freq: Two times a day (BID) | ORAL | 0 refills | Status: DC

## 2022-05-10 NOTE — Patient Instructions (Signed)
Tick Bite Information, Adult  Ticks are insects that can bite. Most ticks live in shrubs and grassy areas. They climb onto people and animals that go by. Then they bite. Some ticks carry germs that can make you sick. How can I prevent tick bites? Take these steps: Use insect repellent Use an insect repellent that has 20% or higher of the ingredients DEET, picaridin, or IR3535. Follow the instructions on the label. Put it on: Bare skin. The tops of your boots. Your pant legs. The ends of your sleeves. If you use an insect repellent that has the ingredient permethrin, follow the instructions on the label. Put it on: Clothing. Boots. Supplies or outdoor gear. Tents. When you are outside Wear long sleeves and long pants. Wear light-colored clothes. Tuck your pant legs into your socks. Stay in the middle of the trail. Do not touch the bushes. Avoid walking through long grass. Check for ticks on your clothes, hair, and skin often while you are outside. Before going inside your house, check your clothes, skin, head, neck, armpits, waist, groin, and joint areas. When you go indoors Check your clothes for ticks. Dry your clothes in a dryer on high heat for 10 minutes or more. If clothes are damp, additional time may be needed. Wash your clothes right away if they need to be washed. Use hot water. Check your pets and outdoor gear. Shower right away. Check your body for ticks. Do a full body check using a mirror. What is the right way to remove a tick? Remove the tick from your skin as soon as possible. Do not remove the tick with your bare fingers. To remove a tick that is crawling on your skin: Go outdoors and brush the tick off. Use tape or a lint roller. To remove a tick that is biting: Wash your hands. If you have latex gloves, put them on. Use tweezers, curved forceps, or a tick-removal tool to grasp the tick. Grasp the tick as close to your skin and as close to the tick's head as  possible. Gently pull up until the tick lets go. Try to keep the tick's head attached to its body. Do not twist or jerk the tick. Do not squeeze or crush the tick. Do not try to remove a tick with heat, alcohol, petroleum jelly, or fingernail polish. What should I do after taking out a tick? Throw away the tick. Do not crush a tick with your fingers. Clean the bite area and your hands with soap and water, rubbing alcohol, or an iodine wash. If an antiseptic cream or ointment is available, apply a small amount to the bite area. Wash and disinfect any instruments that you used to remove the tick. How should I get rid of a live tick? To dispose of a live tick, use one of these methods: Place the tick in rubbing alcohol. Place the tick in a bag or container you can close tightly. Wrap the tick tightly in tape. Flush the tick down the toilet. Contact a doctor if: You have symptoms, such as: A fever or chills. A red rash that makes a circle (bull's-eye rash) in the bite area. Redness and swelling where the tick bit you. Headache. Pain in a muscle, joint, or bone. Being more tired than normal. Trouble walking or moving your legs. Numbness in your legs. Tender and swollen lymph glands. A part of a tick breaks off and gets stuck in your skin. Get help right away if: You cannot remove   a tick. You cannot move (have paralysis) or feel weak. You are feeling worse or have new symptoms. You find a tick that is biting you and filled with blood. This is important if you are in an area where diseases from ticks are common. Summary Ticks may carry germs that can make you sick. To prevent tick bites wear long sleeves, long pants, and light colors. Use insect repellent. Follow the instructions on the label. If the tick is biting, do not try to remove it with heat, alcohol, petroleum jelly, or fingernail polish. Use tweezers, curved forceps, or a tick-removal tool to grasp the tick. Gently pull up  until the tick lets go. Do not twist or jerk the tick. Do not squeeze or crush the tick. If you have symptoms, contact a doctor. This information is not intended to replace advice given to you by your health care provider. Make sure you discuss any questions you have with your health care provider. Document Revised: 10/12/2019 Document Reviewed: 10/12/2019 Elsevier Patient Education  2023 [employer]  

## 2022-05-10 NOTE — Progress Notes (Signed)
Margaret Holmes , [birth year], 81 y.o., female MRN: [id number] Patient Care Team    Relationship Specialty Notifications Start End  [name], DO PCP - General Family Medicine  04/09/16   Richmond Campbell, MD Consulting Physician Gastroenterology  05/23/16   Salvadore Dom, MD Consulting Physician Obstetrics and Gynecology  05/23/16   Leroy Sea, MD Referring Physician Urology  08/27/16   [facility]  Optometry  09/01/18     Chief Complaint  Patient presents with   Insect Bite    Pt presents office with tick and tick bite 2 weeks; pt c/o diarrhea, muscle ache, HA rash x 1 weeks     Subjective: Pt presents for an OV with complaints of tick bite approximately 2 weeks ago.  Complains of muscle aches and diarrhea since that time.  Tick was located in the genital area.  She was able to remove the tick and hold.  There is no rash located at this location.  She has noticed a rash between her breasts that feels like bug bites.  She denies any fevers.    01/08/2022   10:06 AM 06/01/2021    8:06 AM 03/03/2021    2:18 PM 06/15/2020    8:27 AM 09/01/2018    8:48 AM  Depression screen PHQ 2/9  Decreased Interest 0 0 0 0 0  Down, Depressed, Hopeless 0 0 0 1 0  PHQ - 2 Score 0 0 0 1 0    Allergies  Allergen Reactions   Diazepam Other (See Comments)    Psychiatric reaction Caused forgetfulness   Codeine Other (See Comments)    Not specified in old records.   Darvocet [Propoxyphene N-Acetaminophen] Other (See Comments)    Not specified in old records   Hydrocodone Other (See Comments)    Not specified in old records   Social History   Social History Narrative   Married, 3 children, 6 grandchildren, 1 GGc.   Orig from this region.  Drives a school bus (X 31 yrs).   No T/A/Ds.   Caffeine: one cup coffee a day, 3 glasses of sweet tea per day.   No formal exercise.  She is active (NOT sedentary).            Past Medical History:  Diagnosis  Date   Adnexal cyst 07/2013   Left-3cm-noted on CT abd/pelv--f/u pelvic u/s did not show anything (L ovary not visualized, likely secondary to bowel gas)-repeat pelvic u/s after 07/2014.   Anemia    Asthma    DDD (degenerative disc disease), cervical    H/O angular cheilitis    Zinc normal, vit B12 low normal (2012)   H/O vitamin D deficiency 2011   Came back to normal range with replacement therapydd   History of adenomatous polyp of colon 2003, 2006, 2013;2016   Repeat 2013 showed tubular adenoma x 3, with no high grade dysplasia.  2016 no polyps-recall 5 yrs   History of hiatal hernia    Hyperlipidemia    per pt report 05/09/12; also on labs 06/2015.  Intolerant of pravachol 02/2016   Hypothyroidism    Kidney infection    Lower leg pain    Bilateral, crampy--ABIs/dopplers NORMAL 04/2013.   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  +internal hem.  Normal ileoscopy.   DILATATION & CURETTAGE/HYSTEROSCOPY WITH MYOSURE N/A 05/28/2016   Procedure: DILATATION & CURETTAGE/HYSTEROSCOPY WITH MYOSURE with ULTRASOUND guidance;  Surgeon: Salvadore Dom, MD;  Location: [facility];  Service: Gynecology;  Laterality: N/A;  Please have ultrasound in room. Follow Silva's first case.   GANGLION CYST EXCISION  2003   Right wrist, with excision of some triquetrum spurs   stent placed right kidney     THYROIDECTOMY, PARTIAL  1972   TONSILLECTOMY AND ADENOIDECTOMY  age 46   TUBAL LIGATION     WRIST SURGERY  2006   For left thumb carpo-metacarpal arthritis   Family History  Problem Relation  Age of Onset   Cancer Mother        ovarian and throat cancer   Diabetes Mother    Alcohol abuse Father        alcoholism.  Died age 57   Cancer Maternal Grandmother        breast cancer   Allergies as of 05/10/2022       Reactions   Diazepam Other (See Comments)   Psychiatric reaction Caused forgetfulness   Codeine Other (See Comments)   Not specified in old records.   Darvocet [propoxyphene N-acetaminophen] Other (See Comments)   Not specified in old records   Hydrocodone Other (See Comments)   Not specified in old records        Medication List        Accurate as of May 10, 2022 11:59 PM. If you have any questions, ask your nurse or doctor.          STOP taking these medications    cephALEXin 500 MG capsule Commonly known as: KEFLEX Stopped by: Howard Pouch, DO       TAKE these medications    acetaminophen 325 MG tablet Commonly known as: TYLENOL Take 650 mg by mouth every 6 (six) hours as needed.   chlorhexidine 0.12 % solution Commonly known as: PERIDEX Use as directed 15 mLs in the mouth or throat 2 (two) times daily.   clotrimazole-betamethasone cream Commonly known as: LOTRISONE Apply 1 Application topically daily. Started by: Howard Pouch, DO   diclofenac sodium 1 % Gel Commonly known as: VOLTAREN Apply over affected area QID RPN   doxycycline 100 MG tablet Commonly known as: VIBRA-TABS Take 1 tablet (100 mg total) by mouth 2 (two) times daily. Started by: Howard Pouch, DO   fluticasone 50 MCG/ACT nasal spray Commonly known as: FLONASE Place into the nose.   levothyroxine 50 MCG tablet Commonly known as: Synthroid 1 tab  p.o. on an empty stomach Monday-Friday, and 1.5 tabs Saturday and Sunday   vitamin B-12 1000 MCG tablet Commonly known as: CYANOCOBALAMIN Take 1 tablet (1,000 mcg total) by mouth daily.   VITAMIN C-VITAMIN D-ZINC PO Take by mouth daily.        All past medical history, surgical history, allergies, family  history, immunizations andmedications were updated in the EMR today and reviewed under the history and medication portions of their EMR.     ROS Negative, with the exception of above mentioned in HPI   Objective:  BP (!) 110/55   Pulse 64   Temp 97.8 F (36.6 C) (Oral)  Ht '5\' 2"'$  (1.575 m)   Wt 114 lb (51.7 kg)   SpO2 96%   BMI 20.85 kg/m  Body mass index is 20.85 kg/m. Physical Exam Vitals and nursing note reviewed.  Constitutional:      General: She is not in acute distress.    Appearance: Normal appearance. She is normal weight. She is not ill-appearing or toxic-appearing.  Eyes:     Extraocular Movements: Extraocular movements intact.     Conjunctiva/sclera: Conjunctivae normal.     Pupils: Pupils are equal, round, and reactive to light.  Skin:    Findings: No rash.  Neurological:     Mental Status: She is alert and oriented to person, place, and time. Mental status is at baseline.  Psychiatric:        Mood and Affect: Mood normal.        Behavior: Behavior normal.        Thought Content: Thought content normal.        Judgment: Judgment normal.      No results found. No results found. No results found for this or any previous visit (from the past 24 hour(s)).  Assessment/Plan: SITLALY GUDIEL is a 81 y.o. female present for OV for  Tick bite of pelvic region, initial encounter Start doxycycline twice daily x10 days - B. burgdorfi antibodies  Rash: Rash between breasts appear to be more insect bites versus typical Lyme rash. Clotrimazole betamethasone cream described  Reviewed expectations re: course of current medical issues. Discussed self-management of symptoms. Outlined signs and symptoms indicating need for more acute intervention. Patient verbalized understanding and all questions were answered. Patient received an After-Visit Summary.    Orders Placed This Encounter  Procedures   B. burgdorfi antibodies   Meds ordered this encounter   Medications   doxycycline (VIBRA-TABS) 100 MG tablet    Sig: Take 1 tablet (100 mg total) by mouth 2 (two) times daily.    Dispense:  20 tablet    Refill:  0   clotrimazole-betamethasone (LOTRISONE) cream    Sig: Apply 1 Application topically daily.    Dispense:  30 g    Refill:  0   Referral Orders  No referral(s) requested today     Note is dictated utilizing voice recognition software. Although note has been proof read prior to signing, occasional typographical errors still can be missed. If any questions arise, please do not hesitate to call for verification.   electronically signed by:  Howard Pouch, DO  [place]

## 2022-05-11 LAB — B. BURGDORFI ANTIBODIES: B burgdorferi Ab IgG+IgM: <0.9 index

## 2022-05-23 ENCOUNTER — Ambulatory Visit (INDEPENDENT_AMBULATORY_CARE_PROVIDER_SITE_OTHER): Payer: Medicare PPO

## 2022-05-23 DIAGNOSIS — Z0000 Encounter for general adult medical examination without abnormal findings: Secondary | ICD-10-CM

## 2022-05-23 NOTE — Patient Instructions (Signed)
Margaret Holmes , Thank you for taking time to come for your Medicare Wellness Visit. I appreciate your ongoing commitment to your health goals. Please review the following plan we discussed and let me know if I can assist you in the future.   Screening recommendations/referrals: Colonoscopy: done 10/17/20 repeat every 5 years Recommended yearly ophthalmology/optometry visit for glaucoma screening and checkup Recommended yearly dental visit for hygiene and checkup  Vaccinations: Influenza vaccine: due Pneumococcal vaccine: Up to date Tdap vaccine: done 05/07/16 repeat every 10 years Shingles vaccine: completed 8/24, 08/25/18   Covid-19: declined and discussed   Advanced directives: Advance directive discussed with you today. Even though you declined this today please call our office should you change your mind and we can give you the proper paperwork for you to fill out.  Conditions/risks identified: stay healthy  Next appointment: Follow up in one year for your annual wellness visit.   Preventive Care 40 Years and Older, Female Preventive care refers to lifestyle choices and visits with your health care provider that can promote health and wellness. What does preventive care include? A yearly physical exam. This is also called an annual well check. Dental exams once or twice a year. Routine eye exams. Ask your health care provider how often you should have your eyes checked. Personal lifestyle choices, including: Daily care of your teeth and gums. Regular physical activity. Eating a healthy diet. Avoiding tobacco and drug use. Limiting alcohol use. Practicing safe sex. Taking low doses of aspirin every day. Taking vitamin and mineral supplements as recommended by your health care provider. What happens during an annual well check? The services and screenings done by your health care provider during your annual well check will depend on your age, overall health, lifestyle risk factors,  and family history of disease. Counseling  Your health care provider may ask you questions about your: Alcohol use. Tobacco use. Drug use. Emotional well-being. Home and relationship well-being. Sexual activity. Eating habits. History of falls. Memory and ability to understand (cognition). Work and work Statistician. Screening  You may have the following tests or measurements: Height, weight, and BMI. Blood pressure. Lipid and cholesterol levels. These may be checked every 5 years, or more frequently if you are over 17 years old. Skin check. Lung cancer screening. You may have this screening every year starting at age 78 if you have a 30-pack-year history of smoking and currently smoke or have quit within the past 15 years. Fecal occult blood test (FOBT) of the stool. You may have this test every year starting at age 43. Flexible sigmoidoscopy or colonoscopy. You may have a sigmoidoscopy every 5 years or a colonoscopy every 10 years starting at age 31. Prostate cancer screening. Recommendations will vary depending on your family history and other risks. Hepatitis C blood test. Hepatitis B blood test. Sexually transmitted disease (STD) testing. Diabetes screening. This is done by checking your blood sugar (glucose) after you have not eaten for a while (fasting). You may have this done every 1-3 years. Abdominal aortic aneurysm (AAA) screening. You may need this if you are a current or former smoker. Osteoporosis. You may be screened starting at age 84 if you are at high risk. Talk with your health care provider about your test results, treatment options, and if necessary, the need for more tests. Vaccines  Your health care provider may recommend certain vaccines, such as: Influenza vaccine. This is recommended every year. Tetanus, diphtheria, and acellular pertussis (Tdap, Td) vaccine. You may  need a Td booster every 10 years. Zoster vaccine. You may need this after age  35. Pneumococcal 13-valent conjugate (PCV13) vaccine. One dose is recommended after age 78. Pneumococcal polysaccharide (PPSV23) vaccine. One dose is recommended after age 72. Talk to your health care provider about which screenings and vaccines you need and how often you need them. This information is not intended to replace advice given to you by your health care provider. Make sure you discuss any questions you have with your health care provider. Document Released: 11/11/2015 Document Revised: 07/04/2016 Document Reviewed: 08/16/2015 Elsevier Interactive Patient Education  2017 [place] Prevention in the Home Falls can cause injuries. They can happen to people of all ages. There are many things you can do to make your home safe and to help prevent falls. What can I do on the outside of my home? Regularly fix the edges of walkways and driveways and fix any cracks. Remove anything that might make you trip as you walk through a door, such as a raised step or threshold. Trim any bushes or trees on the path to your home. Use bright outdoor lighting. Clear any walking paths of anything that might make someone trip, such as rocks or tools. Regularly check to see if handrails are loose or broken. Make sure that both sides of any steps have handrails. Any raised decks and porches should have guardrails on the edges. Have any leaves, snow, or ice cleared regularly. Use sand or salt on walking paths during winter. Clean up any spills in your garage right away. This includes oil or grease spills. What can I do in the bathroom? Use night lights. Install grab bars by the toilet and in the tub and shower. Do not use towel bars as grab bars. Use non-skid mats or decals in the tub or shower. If you need to sit down in the shower, use a plastic, non-slip stool. Keep the floor dry. Clean up any water that spills on the floor as soon as it happens. Remove soap buildup in the tub or shower  regularly. Attach bath mats securely with double-sided non-slip rug tape. Do not have throw rugs and other things on the floor that can make you trip. What can I do in the bedroom? Use night lights. Make sure that you have a light by your bed that is easy to reach. Do not use any sheets or blankets that are too big for your bed. They should not hang down onto the floor. Have a firm chair that has side arms. You can use this for support while you get dressed. Do not have throw rugs and other things on the floor that can make you trip. What can I do in the kitchen? Clean up any spills right away. Avoid walking on wet floors. Keep items that you use a lot in easy-to-reach places. If you need to reach something above you, use a strong step stool that has a grab bar. Keep electrical cords out of the way. Do not use floor polish or wax that makes floors slippery. If you must use wax, use non-skid floor wax. Do not have throw rugs and other things on the floor that can make you trip. What can I do with my stairs? Do not leave any items on the stairs. Make sure that there are handrails on both sides of the stairs and use them. Fix handrails that are broken or loose. Make sure that handrails are as long as the stairways.  Check any carpeting to make sure that it is firmly attached to the stairs. Fix any carpet that is loose or worn. Avoid having throw rugs at the top or bottom of the stairs. If you do have throw rugs, attach them to the floor with carpet tape. Make sure that you have a light switch at the top of the stairs and the bottom of the stairs. If you do not have them, ask someone to add them for you. What else can I do to help prevent falls? Wear shoes that: Do not have high heels. Have rubber bottoms. Are comfortable and fit you well. Are closed at the toe. Do not wear sandals. If you use a stepladder: Make sure that it is fully opened. Do not climb a closed stepladder. Make sure that  both sides of the stepladder are locked into place. Ask someone to hold it for you, if possible. Clearly mark and make sure that you can see: Any grab bars or handrails. First and last steps. Where the edge of each step is. Use tools that help you move around (mobility aids) if they are needed. These include: Canes. Walkers. Scooters. Crutches. Turn on the lights when you go into a dark area. Replace any light bulbs as soon as they burn out. Set up your furniture so you have a clear path. Avoid moving your furniture around. If any of your floors are uneven, fix them. If there are any pets around you, be aware of where they are. Review your medicines with your doctor. Some medicines can make you feel dizzy. This can increase your chance of falling. Ask your doctor what other things that you can do to help prevent falls. This information is not intended to replace advice given to you by your health care provider. Make sure you discuss any questions you have with your health care provider. Document Released: 08/11/2009 Document Revised: 03/22/2016 Document Reviewed: 11/19/2014 Elsevier Interactive Patient Education  2017 [employer].

## 2022-05-23 NOTE — Progress Notes (Signed)
Virtual Visit via Telephone Note  I connected with  Margaret Holmes on 05/23/22 at  3:00 PM EDT by telephone and verified that I am speaking with the correct person using two identifiers.  Medicare Annual Wellness visit completed telephonically due to Covid-19 pandemic.   Persons participating in this call: This Health Coach and this patient.   Location: Patient: home Provider: office   I discussed the limitations, risks, security and privacy concerns of performing an evaluation and management service by telephone and the availability of in person appointments. The patient expressed understanding and agreed to proceed.  Unable to perform video visit due to video visit attempted and failed and/or patient does not have video capability.   Some vital signs may be absent or patient reported.   Willette Brace, LPN   Subjective:   Margaret Holmes is a 81 y.o. female who presents for Medicare Annual (Subsequent) preventive examination.  Review of Systems     Cardiac Risk Factors include: advanced age (>38mn, >>56women);dyslipidemia     Objective:    There were no vitals filed for this visit. There is no height or weight on file to calculate BMI.     05/23/2022    2:43 PM 04/12/2021    8:24 AM 05/28/2016    9:08 AM 05/17/2016   11:00 AM  Advanced Directives  Does Patient Have a Medical Advance Directive? No No No No  Would patient like information on creating a medical advance directive? No - Patient declined No - Patient declined  Yes - Educational materials given    Current Medications (verified) Outpatient Encounter Medications as of 05/23/2022  Medication Sig   acetaminophen (TYLENOL) 325 MG tablet Take 650 mg by mouth every 6 (six) hours as needed.   clotrimazole-betamethasone (LOTRISONE) cream Apply 1 Application topically daily.   diclofenac sodium (VOLTAREN) 1 % GEL Apply over affected area QID RPN   fluticasone (FLONASE) 50 MCG/ACT nasal spray Place into the nose.    levothyroxine (SYNTHROID) 50 MCG tablet 1 tab  p.o. on an empty stomach Monday-Friday, and 1.5 tabs Saturday and Sunday   vitamin B-12 (CYANOCOBALAMIN) 1000 MCG tablet Take 1 tablet (1,000 mcg total) by mouth daily.   VITAMIN C-VITAMIN D-ZINC PO Take by mouth daily.   [DISCONTINUED] chlorhexidine (PERIDEX) 0.12 % solution Use as directed 15 mLs in the mouth or throat 2 (two) times daily. (Patient not taking: Reported on 05/23/2022)   [DISCONTINUED] doxycycline (VIBRA-TABS) 100 MG tablet Take 1 tablet (100 mg total) by mouth 2 (two) times daily.   No facility-administered encounter medications on file as of 05/23/2022.    Allergies (verified) Diazepam, Codeine, Darvocet [propoxyphene n-acetaminophen], and Hydrocodone   History: Past Medical History:  Diagnosis Date   Adnexal cyst 07/2013   Left-3cm-noted on CT abd/pelv--f/u pelvic u/s did not show anything (L ovary not visualized, likely secondary to bowel gas)-repeat pelvic u/s after 07/2014.   Anemia    Asthma    DDD (degenerative disc disease), cervical    H/O angular cheilitis    Zinc normal, vit B12 low normal (2012)   H/O vitamin D deficiency 2011   Came back to normal range with replacement therapydd   History of adenomatous polyp of colon 2003, 2006, 2013;2016   Repeat 2013 showed tubular adenoma x 3, with no high grade dysplasia.  2016 no polyps-recall 5 yrs   History of hiatal hernia    Hyperlipidemia    per pt report 05/09/12; also on labs 06/2015.  Intolerant of pravachol 02/2016   Hypothyroidism    Kidney infection    Lower leg pain    Bilateral, crampy--ABIs/dopplers NORMAL 04/2013.   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  +internal hem.  Normal ileoscopy.   DILATATION & CURETTAGE/HYSTEROSCOPY WITH MYOSURE N/A 05/28/2016   Procedure: DILATATION & CURETTAGE/HYSTEROSCOPY WITH MYOSURE with ULTRASOUND guidance;  Surgeon: Salvadore Dom, MD;  Location: [facility];  Service: Gynecology;  Laterality: N/A;  Please have ultrasound in room. Follow Silva's first case.   GANGLION CYST EXCISION  2003   Right wrist, with excision of some triquetrum spurs   stent placed right kidney     THYROIDECTOMY, PARTIAL  1972   TONSILLECTOMY AND ADENOIDECTOMY  age 36   TUBAL LIGATION     WRIST SURGERY  2006   For left thumb carpo-metacarpal arthritis   Family History  Problem Relation Age of Onset   Cancer Mother        ovarian and throat cancer   Diabetes Mother    Alcohol abuse Father        alcoholism.  Died age 52   Cancer Maternal Grandmother        breast cancer   Social History   Socioeconomic History   Marital status: Married    Spouse name: Not on file   Number of children: Not on file   Years of education: Not on file   Highest education level: Not on file  Occupational History   Not on file  Tobacco Use   Smoking status: Never   Smokeless tobacco: Never  Vaping Use   Vaping Use: Never used  Substance and Sexual Activity   Alcohol use: No    Alcohol/week: 0.0 standard drinks of alcohol   Drug use: No   Sexual activity: Not Currently  Other Topics Concern   Not on file  Social History Narrative   Married, 3 children, 6 grandchildren, 1 GGc.   Orig from this region.  Drives a school bus (X 31 yrs).   No T/A/Ds.   Caffeine: one cup coffee a day, 3 glasses of sweet tea per day.   No formal exercise.  She is active (NOT sedentary).            Social Determinants of Health   Financial Resource Strain: Low Risk  (05/23/2022)   Overall Financial Resource Strain (CARDIA)     Difficulty of Paying Living Expenses: Not hard at all  Food Insecurity: No Food Insecurity (05/23/2022)   Hunger Vital Sign    Worried About Running Out of Food in the Last Year: Never true    Ran Out of Food in the Last Year: Never true  Transportation Needs: No Transportation Needs (05/23/2022)   PRAPARE - Hydrologist (Medical): No    Lack of Transportation (Non-Medical): No  Physical Activity: Sufficiently Active (05/23/2022)   Exercise Vital Sign    Days of Exercise per Week: 5 days    Minutes of Exercise per Session: 60 min  Stress: No Stress Concern  Present (05/23/2022)   [place]    Feeling of Stress : Not at all  Social Connections: Moderately Integrated (05/23/2022)   Social Connection and Isolation Panel [NHANES]    Frequency of Communication with Friends and Family: More than three times a week    Frequency of Social Gatherings with Friends and Family: More than three times a week    Attends Religious Services: 1 to 4 times per year    Active Member of [employer] or Organizations: No    Attends Music therapist: Never    Marital Status: Married    Tobacco Counseling Counseling given: Not Answered   Clinical Intake:  Pre-visit preparation completed: Yes  Pain : No/denies pain     BMI - recorded: 20.85 Nutritional Status: BMI of 19-24  Normal Nutritional Risks: None Diabetes: No  How often do you need to have someone help you when you read instructions, pamphlets, or other written materials from your doctor or pharmacy?: 1 - Never  Diabetic?no  Interpreter Needed?: No  Information entered by :: Charlott Rakes, LPN   Activities of Daily Living    05/23/2022    2:45 PM  In your present state of health, do you have any difficulty performing the following activities:  Hearing? 0  Vision? 0  Difficulty concentrating or making decisions? 0  Walking or  climbing stairs? 0  Dressing or bathing? 0  Doing errands, shopping? 0  Preparing Food and eating ? N  Using the Toilet? N  In the past six months, have you accidently leaked urine? N  Do you have problems with loss of bowel control? N  Managing your Medications? N  Managing your Finances? N  Housekeeping or managing your Housekeeping? N    Patient Care Team: [name], DO as PCP - General (Family Medicine) Richmond Campbell, MD as Consulting Physician (Gastroenterology) Salvadore Dom, MD as Consulting Physician (Obstetrics and Gynecology) Leroy Sea, MD as Referring Physician (Urology) [place])  Indicate any recent Medical Services you may have received from other than [facility] providers in the past year (date may be approximate).     Assessment:   This is a routine wellness examination for [place].  Hearing/Vision screen Hearing Screening - Comments:: Pt denies any hearing issues  Vision Screening - Comments:: Pt follows up with [facility]   Dietary issues and exercise activities discussed: Current Exercise Habits: Home exercise routine, Type of exercise: walking;Other - see comments, Time (Minutes): 60, Frequency (Times/Week): 5, Weekly Exercise (Minutes/Week): 300   Goals Addressed             This Visit's Progress    Patient Stated       Stay healthy        Depression Screen    05/23/2022    2:42 PM 01/08/2022   10:06 AM 06/01/2021    8:06 AM 03/03/2021    2:18 PM 06/15/2020    8:27 AM 09/01/2018    8:48 AM 09/01/2018    8:46 AM  PHQ 2/9 Scores  PHQ - 2 Score 0 0 0 0 1 0 0    Fall Risk    05/23/2022    2:44 PM 01/08/2022   10:07 AM 06/01/2021    8:03 AM 04/12/2021    8:27 AM 03/03/2021    2:18 PM  Fall Risk   Falls in the past year? 0 1 0 0 0  Number  falls in past yr: 0 0 0 0 0  Injury with Fall? 0 1 0 0 0  Risk for fall due to : Impaired vision   No Fall Risks   Follow up Falls prevention  discussed Falls evaluation completed Falls evaluation completed Falls evaluation completed;Falls prevention discussed Falls evaluation completed    FALL RISK PREVENTION PERTAINING TO THE HOME:  Any stairs in or around the home? Yes  If so, are there any without handrails? No  Home free of loose throw rugs in walkways, pet beds, electrical cords, etc? Yes  Adequate lighting in your home to reduce risk of falls? Yes   ASSISTIVE DEVICES UTILIZED TO PREVENT FALLS:  Life alert? No  Use of a cane, walker or w/c? No  Grab bars in the bathroom? No  Shower chair or bench in shower? Yes  Elevated toilet seat or a handicapped toilet? No   TIMED UP AND GO:  Was the test performed? No .  Cognitive Function:        05/23/2022    2:48 PM  6CIT Screen  What Year? 0 points  What month? 0 points  What time? 0 points  Count back from 20 0 points  Months in reverse 0 points  Repeat phrase 0 points  Total Score 0 points    Immunizations Immunization History  Administered Date(s) Administered   Fluad Quad(high Dose 65+) 07/29/2019, 07/13/2020   Influenza, High Dose Seasonal PF 07/24/2016, 08/08/2017, 08/14/2018   Influenza,inj,Quad PF,6+ Mos 08/01/2015   Pneumococcal Conjugate-13 07/28/2015   Pneumococcal Polysaccharide-23 08/27/2016   Td 05/07/2016   Zoster Recombinat (Shingrix) 06/21/2018, 08/25/2018    TDAP status: Up to date  Flu Vaccine status: Due, Education has been provided regarding the importance of this vaccine. Advised may receive this vaccine at local pharmacy or Health Dept. Aware to provide a copy of the vaccination record if obtained from local pharmacy or Health Dept. Verbalized acceptance and understanding.  Pneumococcal vaccine status: Up to date  Covid-19 vaccine status: Declined, Education has been provided regarding the importance of this vaccine but patient still declined. Advised may receive this vaccine at local pharmacy or Health Dept.or vaccine clinic. Aware  to provide a copy of the vaccination record if obtained from local pharmacy or Health Dept. Verbalized acceptance and understanding.  Qualifies for Shingles Vaccine? Yes   Zostavax completed Yes   Shingrix Completed?: Yes  Screening Tests Health Maintenance  Topic Date Due   COVID-19 Vaccine (1) Never done   INFLUENZA VACCINE  05/29/2022   COLONOSCOPY (Pts 45-63yr Insurance coverage will need to be confirmed)  10/17/2025   TETANUS/TDAP  05/07/2026   Pneumonia Vaccine 79 Years old  Completed   DEXA SCAN  Completed   Hepatitis C Screening  Completed   Zoster Vaccines- Shingrix  Completed   HPV VACCINES  Aged Out    Health Maintenance  Health Maintenance Due  Topic Date Due   COVID-19 Vaccine (1) Never done    Colorectal cancer screening: Type of screening: Colonoscopy. Completed 10/17/20. Repeat every 5 years  Mammogram status: No longer required due to per pt .    Additional Screening:  Hepatitis C Screening: Completed 08/27/16  Vision Screening: Recommended annual ophthalmology exams for early detection of glaucoma and other disorders of the eye. Is the patient up to date with their annual eye exam?  Yes  Who is the provider or what is the name of the office in which the patient attends annual eye exams? [facility]  If pt is not established with a provider, would they like to be referred to a provider to establish care? No .   Dental Screening: Recommended annual dental exams for proper oral hygiene  Community Resource Referral / Chronic Care Management: CRR required this visit?  No   CCM required this visit?  No      Plan:     I have personally reviewed and noted the following in the patient's chart:   Medical and social history Use of alcohol, tobacco or illicit drugs  Current medications and supplements including opioid prescriptions.  Functional ability and status Nutritional status Physical activity Advanced directives List of other  physicians Hospitalizations, surgeries, and ER visits in previous 12 months Vitals Screenings to include cognitive, depression, and falls Referrals and appointments  In addition, I have reviewed and discussed with patient certain preventive protocols, quality metrics, and best practice recommendations. A written personalized care plan for preventive services as well as general preventive health recommendations were provided to patient.     Willette Brace, LPN   04/25/3150   Nurse Notes: none

## 2022-10-04 DIAGNOSIS — B9689 Other specified bacterial agents as the cause of diseases classified elsewhere: Secondary | ICD-10-CM | POA: Diagnosis not present

## 2022-10-04 DIAGNOSIS — J019 Acute sinusitis, unspecified: Secondary | ICD-10-CM | POA: Diagnosis not present

## 2022-10-04 DIAGNOSIS — R030 Elevated blood-pressure reading, without diagnosis of hypertension: Secondary | ICD-10-CM | POA: Diagnosis not present

## 2022-10-04 DIAGNOSIS — Z1152 Encounter for screening for COVID-19: Secondary | ICD-10-CM | POA: Diagnosis not present

## 2022-10-04 DIAGNOSIS — Z79899 Other long term (current) drug therapy: Secondary | ICD-10-CM | POA: Diagnosis not present

## 2022-10-04 DIAGNOSIS — Z888 Allergy status to other drugs, medicaments and biological substances status: Secondary | ICD-10-CM | POA: Diagnosis not present

## 2022-10-04 DIAGNOSIS — J018 Other acute sinusitis: Secondary | ICD-10-CM | POA: Diagnosis not present

## 2022-10-04 DIAGNOSIS — R059 Cough, unspecified: Secondary | ICD-10-CM | POA: Diagnosis not present

## 2022-10-04 DIAGNOSIS — R0602 Shortness of breath: Secondary | ICD-10-CM | POA: Diagnosis not present

## 2022-10-17 ENCOUNTER — Telehealth: Payer: Self-pay | Admitting: Family Medicine

## 2022-10-17 ENCOUNTER — Encounter: Payer: Self-pay | Admitting: Family Medicine

## 2022-10-17 ENCOUNTER — Ambulatory Visit: Payer: Medicare PPO | Admitting: Family Medicine

## 2022-10-17 VITALS — BP 128/65 | HR 73 | Temp 97.9°F | Ht 62.0 in | Wt 108.0 lb

## 2022-10-17 DIAGNOSIS — J019 Acute sinusitis, unspecified: Secondary | ICD-10-CM

## 2022-10-17 DIAGNOSIS — E034 Atrophy of thyroid (acquired): Secondary | ICD-10-CM

## 2022-10-17 DIAGNOSIS — F439 Reaction to severe stress, unspecified: Secondary | ICD-10-CM | POA: Diagnosis not present

## 2022-10-17 DIAGNOSIS — B379 Candidiasis, unspecified: Secondary | ICD-10-CM | POA: Diagnosis not present

## 2022-10-17 LAB — TSH: TSH: 10.69 u[IU]/mL — ABNORMAL HIGH (ref 0.35–5.50)

## 2022-10-17 MED ORDER — FLUCONAZOLE 150 MG PO TABS: 150.0000 mg | ORAL_TABLET | Freq: Once | ORAL | 0 refills | Status: AC

## 2022-10-17 MED ORDER — LEVOTHYROXINE SODIUM 75 MCG PO TABS: 75.0000 ug | ORAL_TABLET | Freq: Every day | ORAL | 3 refills | Status: DC

## 2022-10-17 MED ORDER — ESCITALOPRAM OXALATE 5 MG PO TABS: 5.0000 mg | ORAL_TABLET | Freq: Every day | ORAL | 1 refills | Status: DC

## 2022-10-17 NOTE — Progress Notes (Signed)
Margaret Holmes , Nov 14, 1940, 81 y.o., female MRN: [id number] Patient Care Team    Relationship Specialty Notifications Start End  [name], DO PCP - General Family Medicine  04/09/16   Richmond Campbell, MD Consulting Physician Gastroenterology  05/23/16   Salvadore Dom, MD Consulting Physician Obstetrics and Gynecology  05/23/16   Leroy Sea, MD Referring Physician Urology  08/27/16   [facility]  09/01/18     Chief Complaint  Patient presents with   Hypertension    ED f/u;     Subjective:Margaret Holmes is a 81 y.o. female  Pt presents for an OV presenting to the ED diagnosed with sinus infection, started on Augmentin and Tessalon Perles-2 weeks ago.  She reports she is feeling greatly improved, however she does have a yeast infection from the antibiotics.  Hypothyroid: She reports she has been taking her levothyroxine 50 mcg daily Monday through Friday and 1-1/2 tabs on Saturday and 'Sunday.  Her last TSH was last March and around 12.  She states she missed her appointment when it was due to come back for retesting.  Stress: Patient also complains of increased stress at home.  She is taking care of 2 grandchildren ages 6 and 12.  There is also stress surrounding her husband.  She reports she has never been on medication for stress before but she would like to consider starting 1 now.      12'$ /20/2023    1:03 PM 05/23/2022    2:42 PM 01/08/2022   10:06 AM 06/01/2021    8:06 AM 03/03/2021    2:18 PM  Depression screen PHQ 2/9  Decreased Interest 0 0 0 0 0  Down, Depressed, Hopeless 0 0 0 0 0  PHQ - 2 Score 0 0 0 0 0    Allergies  Allergen Reactions   Diazepam Other (See Comments)    Psychiatric reaction Caused forgetfulness   Codeine Other (See Comments)    Not specified in old records.   Darvocet [Propoxyphene N-Acetaminophen] Other (See Comments)    Not specified in old records   Hydrocodone Other (See Comments)     Not specified in old records   Social History   Social History Narrative   Married, 3 children, 6 grandchildren, 1 GGc.   Orig from this region.  Drives a school bus (X 31 yrs).   No T/A/Ds.   Caffeine: one cup coffee a day, 3 glasses of sweet tea per day.   No formal exercise.  She is active (NOT sedentary).            Past Medical History:  Diagnosis Date   Adnexal cyst 07/2013   Left-3cm-noted on CT abd/pelv--f/u pelvic u/s did not show anything (L ovary not visualized, likely secondary to bowel gas)-repeat pelvic u/s after 07/2014.   Anemia    Asthma    DDD (degenerative disc disease), cervical    H/O angular cheilitis    Zinc normal, vit B12 low normal (2012)   H/O vitamin D deficiency 2011   Came back to normal range with replacement therapydd   History of adenomatous polyp of colon 2003, 2006, 2013;2016   Repeat 2013 showed tubular adenoma x 3, with no high grade dysplasia.  2016 no polyps-recall 5 yrs   History of hiatal hernia    Hyperlipidemia    per pt report 05/09/12; also on labs 06/2015.  Intolerant of pravachol 02/2016  Hypothyroidism    Kidney infection    Lower leg pain    Bilateral, crampy--ABIs/dopplers NORMAL 04/2013.   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  +internal hem.  Normal ileoscopy.   DILATATION & CURETTAGE/HYSTEROSCOPY WITH MYOSURE N/A 05/28/2016   Procedure: DILATATION & CURETTAGE/HYSTEROSCOPY WITH MYOSURE with ULTRASOUND guidance;  Surgeon: Salvadore Dom, MD;  Location: [facility];  Service: Gynecology;  Laterality: N/A;   Please have ultrasound in room. Follow Silva's first case.   GANGLION CYST EXCISION  2003   Right wrist, with excision of some triquetrum spurs   stent placed right kidney     THYROIDECTOMY, PARTIAL  1972   TONSILLECTOMY AND ADENOIDECTOMY  age 54   TUBAL LIGATION     WRIST SURGERY  2006   For left thumb carpo-metacarpal arthritis   Family History  Problem Relation Age of Onset   Cancer Mother        ovarian and throat cancer   Diabetes Mother    Alcohol abuse Father        alcoholism.  Died age 49   Cancer Maternal Grandmother        breast cancer   Allergies as of 10/17/2022       Reactions   Diazepam Other (See Comments)   Psychiatric reaction Caused forgetfulness   Codeine Other (See Comments)   Not specified in old records.   Darvocet [propoxyphene N-acetaminophen] Other (See Comments)   Not specified in old records   Hydrocodone Other (See Comments)   Not specified in old records        Medication List        Accurate as of October 17, 2022  1:10 PM. If you have any questions, ask your nurse or doctor.          acetaminophen 325 MG tablet Commonly known as: TYLENOL Take 650 mg by mouth every 6 (six) hours as needed.   clotrimazole-betamethasone cream Commonly known as: LOTRISONE Apply 1 Application topically daily.   cyanocobalamin 1000 MCG tablet Commonly known as: VITAMIN B12 Take 1 tablet (1,000 mcg total) by mouth daily.   diclofenac sodium 1 % Gel Commonly known as: VOLTAREN Apply over affected area QID RPN   fluticasone 50 MCG/ACT nasal spray Commonly known as: FLONASE Place into the nose.   levothyroxine 50 MCG tablet Commonly known as: Synthroid 1 tab  p.o. on an empty stomach Monday-Friday, and 1.5 tabs Saturday and Sunday   VITAMIN C-VITAMIN D-ZINC PO Take by mouth daily.        All past medical history, surgical history, allergies, family history, immunizations andmedications were updated in the EMR today and reviewed under  the history and medication portions of their EMR.     ROS Negative, with the exception of above mentioned in HPI   Objective:  BP 128/65   Pulse 73   Temp 97.9 F (36.6 C) (Oral)   Ht '5\' 2"'$  (1.575 m)   Wt 108 lb (49 kg)   SpO2 97%   BMI 19.75 kg/m  Body mass index is  19.75 kg/m. Physical Exam Vitals and nursing note reviewed.  Constitutional:      General: She is not in acute distress.    Appearance: Normal appearance. She is not ill-appearing, toxic-appearing or diaphoretic.  HENT:     Head: Normocephalic and atraumatic.     Right Ear: Tympanic membrane and ear canal normal.     Left Ear: Tympanic membrane and ear canal normal.     Nose: No congestion or rhinorrhea.  Eyes:     General: No scleral icterus.       Right eye: No discharge.        Left eye: No discharge.     Extraocular Movements: Extraocular movements intact.     Conjunctiva/sclera: Conjunctivae normal.     Pupils: Pupils are equal, round, and reactive to light.  Cardiovascular:     Rate and Rhythm: Normal rate and regular rhythm.  Pulmonary:     Effort: Pulmonary effort is normal. No respiratory distress.     Breath sounds: Normal breath sounds. No wheezing, rhonchi or rales.  Musculoskeletal:     Right lower leg: No edema.     Left lower leg: No edema.  Skin:    General: Skin is warm and dry.     Coloration: Skin is not jaundiced or pale.     Findings: No erythema or rash.  Neurological:     Mental Status: She is alert and oriented to person, place, and time. Mental status is at baseline.     Motor: No weakness.     Gait: Gait normal.  Psychiatric:        Mood and Affect: Mood normal.        Behavior: Behavior normal.        Thought Content: Thought content normal.        Judgment: Judgment normal.      No results found. No results found. No results found for this or any previous visit (from the past 24 hour(s)).  Assessment/Plan: Margaret Holmes is a 81 y.o. female present for OV for   Hypothyroidism due to acquired atrophy of thyroid Update:: Patient's TSH is still under supplemented at 10.  Increase levothyroxine to 75 mcg daily.  Follow-up in 2 months - TSH  Yeast infection Diflucan prescribed  Stress at home Discussed options for treatment today and she would like to start a medication. Start Lexapro 5 mg daily Follow-up in 8 weeks  Acute sinusitis, recurrence not specified, unspecified location Resolved.  All symptoms are greatly improved  Reviewed expectations re: course of current medical issues. Discussed self-management of symptoms. Outlined signs and symptoms indicating need for more acute intervention. Patient verbalized understanding and all questions were answered. Patient received an After-Visit Summary.    No orders of the defined types were placed in this encounter.  No orders of the defined types were placed in this encounter.  Referral Orders  No referral(s) requested today     Note is dictated utilizing voice recognition software. Although note has been proof read prior to signing, occasional typographical errors still can be missed. If any questions arise, please do not hesitate to call for verification.   electronically signed by:  Howard Pouch, DO  [place]

## 2022-10-17 NOTE — Telephone Encounter (Signed)
Left message with husband to have pt CB for results.

## 2022-10-17 NOTE — Telephone Encounter (Signed)
Please call patient: Her thyroid is still under supplemented.  I have called in levothyroxine 75 mcg daily.  She will take 1 tab daily of this new prescription. Follow-up in 2 months with provider for thyroid recheck and recheck after starting new medication of Lexapro.

## 2022-10-18 DIAGNOSIS — Z23 Encounter for immunization: Secondary | ICD-10-CM | POA: Diagnosis not present

## 2022-10-18 DIAGNOSIS — W268XXA Contact with other sharp object(s), not elsewhere classified, initial encounter: Secondary | ICD-10-CM | POA: Diagnosis not present

## 2022-10-18 DIAGNOSIS — S61212A Laceration without foreign body of right middle finger without damage to nail, initial encounter: Secondary | ICD-10-CM | POA: Diagnosis not present

## 2022-10-18 DIAGNOSIS — M7989 Other specified soft tissue disorders: Secondary | ICD-10-CM | POA: Diagnosis not present

## 2022-10-18 DIAGNOSIS — Z888 Allergy status to other drugs, medicaments and biological substances status: Secondary | ICD-10-CM | POA: Diagnosis not present

## 2022-10-18 DIAGNOSIS — Z79899 Other long term (current) drug therapy: Secondary | ICD-10-CM | POA: Diagnosis not present

## 2022-10-18 NOTE — Telephone Encounter (Signed)
Spoke with pt regarding labs and instructions.   

## 2022-10-30 ENCOUNTER — Ambulatory Visit: Payer: Medicare PPO | Admitting: Family Medicine

## 2022-10-30 ENCOUNTER — Encounter: Payer: Self-pay | Admitting: Family Medicine

## 2022-10-30 VITALS — BP 128/69 | HR 68 | Temp 97.8°F | Ht 62.0 in | Wt 108.0 lb

## 2022-10-30 DIAGNOSIS — S61212A Laceration without foreign body of right middle finger without damage to nail, initial encounter: Secondary | ICD-10-CM | POA: Diagnosis not present

## 2022-10-30 DIAGNOSIS — J019 Acute sinusitis, unspecified: Secondary | ICD-10-CM

## 2022-10-30 DIAGNOSIS — S61212D Laceration without foreign body of right middle finger without damage to nail, subsequent encounter: Secondary | ICD-10-CM

## 2022-10-30 MED ORDER — CEPHALEXIN 500 MG PO CAPS: 500.0000 mg | ORAL_CAPSULE | Freq: Three times a day (TID) | ORAL | 0 refills | Status: AC

## 2022-10-30 NOTE — Progress Notes (Signed)
OFFICE VISIT  10/30/2022  CC:  Chief Complaint  Patient presents with   Suture / Staple Removal    Pt has 18 4-0 Nylon stitches on R 3rd digit    [name] who presents for emergency department follow-up for laceration of right middle finger that required sutures.  HPI: [facility] emergency department 10/18/2022.  Sustained right hand third finger laceration by a log splitter. Finger x-ray in the ED showed no bony injury per radiologist but EDP noted that there may be a small avulsion fracture at the DIP joint.  18 interrupted sutures were placed.  Finger feels much better. No sensory abnormality. She cannot flex or extend the DIP joint of her middle finger.  She also notes that she has had over a week of nasal congestion, sinus pain, and postnasal drip. No fever, no significant cough.  Past Medical History:  Diagnosis Date   Adnexal cyst 07/2013   Left-3cm-noted on CT abd/pelv--f/u pelvic u/s did not show anything (L ovary not visualized, likely secondary to bowel gas)-repeat pelvic u/s after 07/2014.   Anemia    Asthma    DDD (degenerative disc disease), cervical    H/O angular cheilitis    Zinc normal, vit B12 low normal (2012)   H/O vitamin D deficiency 2011   Came back to normal range with replacement therapydd   History of adenomatous polyp of colon 2003, 2006, 2013;2016   Repeat 2013 showed tubular adenoma x 3, with no high grade dysplasia.  2016 no polyps-recall 5 yrs   History of hiatal hernia    Hyperlipidemia    per pt report 05/09/12; also on labs 06/2015.  Intolerant of pravachol 02/2016   Hypothyroidism    Kidney infection    Lower leg pain    Bilateral, crampy--ABIs/dopplers NORMAL 04/2013.   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  +internal hem.  Normal ileoscopy.   DILATATION & CURETTAGE/HYSTEROSCOPY WITH MYOSURE N/A 05/28/2016   Procedure: DILATATION & CURETTAGE/HYSTEROSCOPY WITH MYOSURE with ULTRASOUND guidance;  Surgeon: Salvadore Dom, MD;  Location: [facility];  Service: Gynecology;  Laterality: N/A;  Please have ultrasound in room. Follow Silva's first case.   GANGLION CYST EXCISION  2003   Right wrist, with excision of some triquetrum spurs   stent placed right kidney     THYROIDECTOMY, PARTIAL  1972   TONSILLECTOMY AND ADENOIDECTOMY  age 89   TUBAL LIGATION     WRIST SURGERY  2006   For left thumb carpo-metacarpal arthritis    Outpatient Medications Prior to Visit  Medication Sig Dispense Refill   acetaminophen (TYLENOL) 325 MG tablet Take 650 mg by mouth every 6 (six) hours as needed.     clotrimazole-betamethasone (LOTRISONE) cream Apply 1 Application topically daily. 30 g 0   diclofenac sodium (VOLTAREN) 1 % GEL Apply over affected area QID RPN 100 g 5   escitalopram (LEXAPRO) 5 MG tablet Take 1 tablet (5 mg total) by mouth daily. 90 tablet 1   fluticasone (FLONASE) 50 MCG/ACT  nasal spray Place into the nose.     levothyroxine (SYNTHROID) 75 MCG tablet Take 1 tablet (75 mcg total) by mouth daily before breakfast. 90 tablet 3   vitamin B-12 (CYANOCOBALAMIN) 1000 MCG tablet Take 1 tablet (1,000 mcg total) by mouth daily. 90 tablet 3   VITAMIN C-VITAMIN D-ZINC PO Take by mouth daily.     No facility-administered medications prior to visit.    Allergies  Allergen Reactions   Diazepam Other (See Comments)    Psychiatric reaction Caused forgetfulness   Codeine Other (See Comments)    Not specified in old records.   Darvocet [Propoxyphene N-Acetaminophen] Other  (See Comments)    Not specified in old records   Hydrocodone Other (See Comments)    Not specified in old records    Review of Systems As per HPI  PE:    10/30/2022   11:29 AM 10/17/2022   12:59 PM 05/10/2022   11:56 AM  Vitals with BMI  Height '5\' 2"'$  '5\' 2"'$  '5\' 2"'$   Weight 108 lbs 108 lbs 114 lbs  BMI 19.75 70.96 28.36  Systolic 629 476 546  Diastolic 69 65 55  Pulse 68 73 64     Physical Exam  Gen: Alert, well appearing.  Patient is oriented to person, place, time, and situation. The distal one half of the right middle finger is swollen.  No erythema.  Repaired laceration on the tip of the distal phalanx essentially extending around the circumference of the finger.  Wound edges are well-approximated, sutures intact.  Minimal tenderness to palpation. Flexion and extension at the PIP are intact but she is unable to flex or extend the DIP joint.  VS: noted--normal. Gen: alert, NAD, NONTOXIC APPEARING. HEENT: eyes without injection, drainage, or swelling.  Ears: EACs clear, TMs with normal light reflex and landmarks.  Nose: Clear rhinorrhea, with some dried, crusty exudate adherent to mildly injected mucosa.  No purulent d/c.  No paranasal sinus TTP.  No facial swelling.  Throat and mouth without focal lesion.  No pharyngial swelling, erythema, or exudate.   Neck: supple, no LAD.   LUNGS: CTA bilat, nonlabored resps.   CV: RRR, no m/r/g. EXT: no c/c/e SKIN: no rash  LABS:  Last metabolic panel Lab Results  Component Value Date   GLUCOSE 93 06/01/2021   NA 139 06/01/2021   K 4.1 06/01/2021   CL 103 06/01/2021   CO2 30 06/01/2021   BUN 16 06/01/2021   CREATININE 1.02 06/01/2021   CALCIUM 8.8 06/01/2021   PHOS 2.9 10/31/2012   PROT 6.5 06/01/2021   ALBUMIN 3.9 06/01/2021   BILITOT 0.7 06/01/2021   ALKPHOS 48 06/01/2021   AST 18 06/01/2021   ALT 10 06/01/2021   IMPRESSION AND PLAN:  #1 right middle finger laceration, healing appropriately. I removed all sutures today  (18). Wound care discussed. Keflex 500 mg 3 times daily x 7 days. Extensive edema of the distal phalanx could certainly explain her lack of flexion and extension at this joint. Over time as her swelling goes down we will have to make sure that she can flex and extend the joint so we can be sure she does not have tendon injury.  Tdap was given in the ED.  #2 prolonged URI, history of recurrent sinusitis. Keflex 500 mg 3 times daily x 7 days.     An After Visit Summary was printed and given to the patient.  FOLLOW UP: Return in about 1 week (around 11/06/2022) for recheck finger.  Signed:  Crissie Sickles, MD           10/30/2022

## 2022-11-06 ENCOUNTER — Ambulatory Visit: Payer: Medicare PPO | Admitting: Family Medicine

## 2022-11-06 ENCOUNTER — Encounter: Payer: Self-pay | Admitting: Family Medicine

## 2022-11-06 VITALS — BP 116/61 | HR 65 | Temp 98.0°F | Ht 62.0 in | Wt 109.0 lb

## 2022-11-06 DIAGNOSIS — S6991XD Unspecified injury of right wrist, hand and finger(s), subsequent encounter: Secondary | ICD-10-CM | POA: Diagnosis not present

## 2022-11-06 NOTE — Progress Notes (Unsigned)
Margaret Holmes , 1941-10-04, 82 y.o., female MRN: [id number] Patient Care Team    Relationship Specialty Notifications Start End  [name], DO PCP - General Family Medicine  04/09/16   Richmond Campbell, MD Consulting Physician Gastroenterology  05/23/16   Salvadore Dom, MD Consulting Physician Obstetrics and Gynecology  05/23/16   Leroy Sea, MD Referring Physician Urology  08/27/16   [facility]  09/01/18     Chief Complaint  Patient presents with   Laceration    Some swelling but improving      Subjective: Pt presents for an OV with complaints of *** of *** duration.  Associated symptoms include ***. Pt has tried *** to ease their symptoms.      10/17/2022    1:03 PM 05/23/2022    2:42 PM 01/08/2022   10:06 AM 06/01/2021    8:06 AM 03/03/2021    2:18 PM  Depression screen PHQ 2/9  Decreased Interest 0 0 0 0 0  Down, Depressed, Hopeless 0 0 0 0 0  PHQ - 2 Score 0 0 0 0 0    Allergies  Allergen Reactions   Diazepam Other (See Comments)    Psychiatric reaction Caused forgetfulness   Codeine Other (See Comments)    Not specified in old records.   Darvocet [Propoxyphene N-Acetaminophen] Other (See Comments)    Not specified in old records   Hydrocodone Other (See Comments)    Not specified in old records   Social History   Social History Narrative   Married, 3 children, 6 grandchildren, 1 GGc.   Orig from this region.  Drives a school bus (X 31 yrs).   No T/A/Ds.   Caffeine: one cup coffee a day, 3 glasses of sweet tea per day.   No formal exercise.  She is active (NOT sedentary).            Past Medical History:  Diagnosis Date   Adnexal cyst 07/2013   Left-3cm-noted on CT abd/pelv--f/u pelvic u/s did not show anything (L ovary not visualized, likely secondary to bowel gas)-repeat pelvic u/s after 07/2014.   Anemia    Asthma    DDD (degenerative disc disease), cervical    H/O angular cheilitis     Zinc normal, vit B12 low normal (2012)   H/O vitamin D deficiency 2011   Came back to normal range with replacement therapydd   History of adenomatous polyp of colon 2003, 2006, 2013;2016   Repeat 2013 showed tubular adenoma x 3, with no high grade dysplasia.  2016 no polyps-recall 5 yrs   History of hiatal hernia    Hyperlipidemia    per pt report 05/09/12; also on labs 06/2015.  Intolerant of pravachol 02/2016   Hypothyroidism    Kidney infection    Lower leg pain    Bilateral, crampy--ABIs/dopplers NORMAL 04/2013.   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  +internal hem.  Normal ileoscopy.   DILATATION & CURETTAGE/HYSTEROSCOPY WITH MYOSURE N/A 05/28/2016  Procedure: DILATATION & CURETTAGE/HYSTEROSCOPY WITH MYOSURE with ULTRASOUND guidance;  Surgeon: Salvadore Dom, MD;  Location: [facility];  Service: Gynecology;  Laterality: N/A;  Please have ultrasound in room. Follow Silva's first case.   GANGLION CYST EXCISION  2003   Right wrist, with excision of some triquetrum spurs   stent placed right kidney     THYROIDECTOMY, PARTIAL  1972   TONSILLECTOMY AND ADENOIDECTOMY  age 48   TUBAL LIGATION     WRIST SURGERY  2006   For left thumb carpo-metacarpal arthritis   Family History  Problem Relation Age of Onset   Cancer Mother        ovarian and throat cancer   Diabetes Mother    Alcohol abuse Father        alcoholism.  Died age 75   Cancer Maternal Grandmother        breast cancer   Allergies as of 11/06/2022       Reactions   Diazepam Other (See Comments)    Psychiatric reaction Caused forgetfulness   Codeine Other (See Comments)   Not specified in old records.   Darvocet [propoxyphene N-acetaminophen] Other (See Comments)   Not specified in old records   Hydrocodone Other (See Comments)   Not specified in old records        Medication List        Accurate as of November 06, 2022 10:10 AM. If you have any questions, ask your nurse or doctor.          acetaminophen 325 MG tablet Commonly known as: TYLENOL Take 650 mg by mouth every 6 (six) hours as needed.   cephALEXin 500 MG capsule Commonly known as: KEFLEX Take 1 capsule (500 mg total) by mouth 3 (three) times daily for 7 days.   clotrimazole-betamethasone cream Commonly known as: LOTRISONE Apply 1 Application topically daily.   cyanocobalamin 1000 MCG tablet Commonly known as: VITAMIN B12 Take 1 tablet (1,000 mcg total) by mouth daily.   diclofenac sodium 1 % Gel Commonly known as: VOLTAREN Apply over affected area QID RPN   escitalopram 5 MG tablet Commonly known as: Lexapro Take 1 tablet (5 mg total) by mouth daily.   fluticasone 50 MCG/ACT nasal spray Commonly known as: FLONASE Place into the nose.   levothyroxine 75 MCG tablet Commonly known as: Synthroid Take 1 tablet (75 mcg total) by mouth daily before breakfast.   VITAMIN C-VITAMIN D-ZINC PO Take by mouth daily.        All past medical history, surgical history, allergies, family history, immunizations andmedications were updated in the EMR today and reviewed under the history and medication portions of their EMR.     ROS Negative, with the exception of above mentioned in HPI   Objective:  BP 116/61   Pulse 65   Temp 98 F (36.7 C) (Oral)   Ht '5\' 2"'$  (1.575 m)   Wt 109 lb (49.4 kg)   SpO2 97%   BMI 19.94 kg/m  Body mass index is 19.94 kg/m. Physical Exam   No results found. No results found. No results found for this or any previous visit (from the past 24  hour(s)).  Assessment/Plan: Margaret Holmes is a 82 y.o. female present for OV for  *** Reviewed expectations re: course of current medical issues. Discussed self-management of symptoms. Outlined signs and symptoms indicating need for more acute intervention. Patient verbalized understanding and all questions were answered. Patient received an After-Visit Summary.    No  orders of the defined types were placed in this encounter.  No orders of the defined types were placed in this encounter.  Referral Orders  No referral(s) requested today     Note is dictated utilizing voice recognition software. Although note has been proof read prior to signing, occasional typographical errors still can be missed. If any questions arise, please do not hesitate to call for verification.   electronically signed by:  Howard Pouch, DO  [place]

## 2022-11-15 DIAGNOSIS — S61212A Laceration without foreign body of right middle finger without damage to nail, initial encounter: Secondary | ICD-10-CM | POA: Diagnosis not present

## 2022-11-15 DIAGNOSIS — M79644 Pain in right finger(s): Secondary | ICD-10-CM | POA: Diagnosis not present

## 2022-11-23 DIAGNOSIS — Z0189 Encounter for other specified special examinations: Secondary | ICD-10-CM | POA: Diagnosis not present

## 2022-11-23 DIAGNOSIS — M21241 Flexion deformity, right finger joints: Secondary | ICD-10-CM | POA: Diagnosis not present

## 2022-11-23 DIAGNOSIS — S61212A Laceration without foreign body of right middle finger without damage to nail, initial encounter: Secondary | ICD-10-CM | POA: Diagnosis not present

## 2022-11-23 DIAGNOSIS — S66322A Laceration of extensor muscle, fascia and tendon of right middle finger at wrist and hand level, initial encounter: Secondary | ICD-10-CM | POA: Diagnosis not present

## 2022-11-23 DIAGNOSIS — Z8739 Personal history of other diseases of the musculoskeletal system and connective tissue: Secondary | ICD-10-CM | POA: Diagnosis not present

## 2022-11-23 DIAGNOSIS — M948X4 Other specified disorders of cartilage, hand: Secondary | ICD-10-CM | POA: Diagnosis not present

## 2022-11-29 DIAGNOSIS — S61212A Laceration without foreign body of right middle finger without damage to nail, initial encounter: Secondary | ICD-10-CM | POA: Diagnosis not present

## 2022-11-29 DIAGNOSIS — M79641 Pain in right hand: Secondary | ICD-10-CM | POA: Diagnosis not present

## 2022-11-29 DIAGNOSIS — S61212D Laceration without foreign body of right middle finger without damage to nail, subsequent encounter: Secondary | ICD-10-CM | POA: Diagnosis not present

## 2022-12-19 ENCOUNTER — Ambulatory Visit: Payer: Medicare PPO | Admitting: Family Medicine

## 2022-12-19 ENCOUNTER — Encounter: Payer: Self-pay | Admitting: Family Medicine

## 2022-12-19 VITALS — BP 113/67 | HR 66 | Temp 97.7°F | Wt 108.8 lb

## 2022-12-19 DIAGNOSIS — D509 Iron deficiency anemia, unspecified: Secondary | ICD-10-CM | POA: Diagnosis not present

## 2022-12-19 DIAGNOSIS — E559 Vitamin D deficiency, unspecified: Secondary | ICD-10-CM | POA: Diagnosis not present

## 2022-12-19 DIAGNOSIS — Z79899 Other long term (current) drug therapy: Secondary | ICD-10-CM

## 2022-12-19 DIAGNOSIS — E039 Hypothyroidism, unspecified: Secondary | ICD-10-CM | POA: Diagnosis not present

## 2022-12-19 DIAGNOSIS — E782 Mixed hyperlipidemia: Secondary | ICD-10-CM | POA: Diagnosis not present

## 2022-12-19 LAB — CBC
HCT: 37.2 % (ref 36.0–46.0)
Hemoglobin: 12.3 g/dL (ref 12.0–15.0)
MCHC: 33 g/dL (ref 30.0–36.0)
MCV: 89.9 fl (ref 78.0–100.0)
Platelets: 246 10*3/uL (ref 150.0–400.0)
RBC: 4.13 Mil/uL (ref 3.87–5.11)
RDW: 14.6 % (ref 11.5–15.5)
WBC: 5.4 10*3/uL (ref 4.0–10.5)

## 2022-12-19 LAB — HEMOGLOBIN A1C: Hgb A1c MFr Bld: 5.7 % (ref 4.6–6.5)

## 2022-12-19 LAB — T4, FREE: Free T4: 0.98 ng/dL (ref 0.60–1.60)

## 2022-12-19 LAB — TSH: TSH: 1 u[IU]/mL (ref 0.35–5.50)

## 2022-12-19 LAB — VITAMIN D 25 HYDROXY (VIT D DEFICIENCY, FRACTURES): VITD: 23.65 ng/mL — ABNORMAL LOW (ref 30.00–100.00)

## 2022-12-19 MED ORDER — FEXOFENADINE HCL 180 MG PO TABS: 180.0000 mg | ORAL_TABLET | Freq: Every day | ORAL | 5 refills | Status: DC

## 2022-12-19 NOTE — Progress Notes (Signed)
Margaret Holmes , 09-04-1941, 82 y.o., female MRN: [id number] Patient Care Team    Relationship Specialty Notifications Start End  [name], DO PCP - General Family Medicine  04/09/16   Richmond Campbell, MD Consulting Physician Gastroenterology  05/23/16   Salvadore Dom, MD Consulting Physician Obstetrics and Gynecology  05/23/16   Leroy Sea, MD Referring Physician Urology  08/27/16   [facility]  09/01/18     Chief Complaint  Patient presents with   Hypothyroidism     Subjective:Margaret Holmes is a 82 y.o. female  Pt presents for an OV for Chronic Conditions/illness Management  Hypothyroid: She reports she has been taking her levothyroxine 75 mcg daily and empty stomach.  Her last TSH was under supplemented and dose was increased.  Patient reports she is feeling well and no side effects from increased dose.  She has been able to gain a little weight back.      10/17/2022    1:03 PM 05/23/2022    2:42 PM 01/08/2022   10:06 AM 06/01/2021    8:06 AM 03/03/2021    2:18 PM  Depression screen PHQ 2/9  Decreased Interest 0 0 0 0 0  Down, Depressed, Hopeless 0 0 0 0 0  PHQ - 2 Score 0 0 0 0 0    Allergies  Allergen Reactions   Diazepam Other (See Comments)    Psychiatric reaction Caused forgetfulness   Codeine Other (See Comments)    Not specified in old records.   Darvocet [Propoxyphene N-Acetaminophen] Other (See Comments)    Not specified in old records   Hydrocodone Other (See Comments)    Not specified in old records   Social History   Social History Narrative   Married, 3 children, 6 grandchildren, 1 GGc.   Orig from this region.  Drives a school bus (X 31 yrs).   No T/A/Ds.   Caffeine: one cup coffee a day, 3 glasses of sweet tea per day.   No formal exercise.  She is active (NOT sedentary).            Past Medical History:  Diagnosis Date   Adnexal cyst 07/2013   Left-3cm-noted on CT abd/pelv--f/u  pelvic u/s did not show anything (L ovary not visualized, likely secondary to bowel gas)-repeat pelvic u/s after 07/2014.   Anemia    Asthma    DDD (degenerative disc disease), cervical    H/O angular cheilitis    Zinc normal, vit B12 low normal (2012)   H/O vitamin D deficiency 2011   Came back to normal range with replacement therapydd   History of adenomatous polyp of colon 2003, 2006, 2013;2016   Repeat 2013 showed tubular adenoma x 3, with no high grade dysplasia.  2016 no polyps-recall 5 yrs   History of hiatal hernia    Hyperlipidemia    per pt report 05/09/12; also on labs 06/2015.  Intolerant of pravachol 02/2016   Hypothyroidism    Kidney infection    Lower leg pain    Bilateral, crampy--ABIs/dopplers NORMAL 04/2013.   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  +internal hem.  Normal ileoscopy.   DILATATION & CURETTAGE/HYSTEROSCOPY WITH MYOSURE N/A 05/28/2016   Procedure: DILATATION & CURETTAGE/HYSTEROSCOPY WITH MYOSURE with ULTRASOUND guidance;  Surgeon: Salvadore Dom, MD;  Location: [facility];  Service: Gynecology;  Laterality: N/A;  Please have ultrasound in room. Follow Silva's first case.   GANGLION CYST EXCISION  2003   Right wrist, with excision of some triquetrum spurs   stent placed right kidney     THYROIDECTOMY, PARTIAL  1972   TONSILLECTOMY AND ADENOIDECTOMY  age 46   TUBAL LIGATION     WRIST SURGERY  2006   For left thumb carpo-metacarpal arthritis   Family History  Problem Relation Age of Onset   Cancer Mother        ovarian and throat cancer    Diabetes Mother    Alcohol abuse Father        alcoholism.  Died age 72   Cancer Maternal Grandmother        breast cancer   Allergies as of 12/19/2022       Reactions   Diazepam Other (See Comments)   Psychiatric reaction Caused forgetfulness   Codeine Other (See Comments)   Not specified in old records.   Darvocet [propoxyphene N-acetaminophen] Other (See Comments)   Not specified in old records   Hydrocodone Other (See Comments)   Not specified in old records        Medication List        Accurate as of December 19, 2022 11:10 AM. If you have any questions, ask your nurse or doctor.          STOP taking these medications    acetaminophen 325 MG tablet Commonly known as: TYLENOL Stopped by: Howard Pouch, DO   clotrimazole-betamethasone cream Commonly known as: LOTRISONE Stopped by: Howard Pouch, DO   escitalopram 5 MG tablet Commonly known as: Lexapro Stopped by: Howard Pouch, DO       TAKE these medications    cyanocobalamin 1000 MCG tablet Commonly known as: VITAMIN B12 Take 1 tablet (1,000 mcg total) by mouth daily.   diclofenac sodium 1 % Gel Commonly known as: VOLTAREN Apply over affected area QID RPN   fexofenadine 180 MG tablet Commonly known as: Allegra Allergy Take 1 tablet (180 mg total) by mouth daily. Started by: Howard Pouch, DO   fluticasone 50 MCG/ACT nasal spray Commonly known as: FLONASE Place into the nose.   levothyroxine 75 MCG tablet Commonly known as: Synthroid Take 1 tablet (75 mcg total) by mouth daily before breakfast.   VITAMIN C-VITAMIN D-ZINC PO Take by mouth daily.        All past medical history, surgical history, allergies, family history, immunizations andmedications were updated in the EMR today and reviewed under the history and medication portions of their EMR.     ROS Negative, with the exception of above mentioned in HPI   Objective:  BP 113/67   Pulse 66   Temp 97.7 F (36.5 C)   Wt 108 lb  12.8 oz (49.4 kg)   SpO2 97%   BMI 19.90 kg/m  Body mass index is 19.9 kg/m. Physical Exam Vitals and nursing note reviewed.  Constitutional:      General: She is not in acute distress.    Appearance: She is not ill-appearing, toxic-appearing or diaphoretic.     Comments: Thin.  HENT:  Head: Normocephalic and atraumatic.     Right Ear: Tympanic membrane and ear canal normal.     Left Ear: Tympanic membrane and ear canal normal.     Nose: No congestion or rhinorrhea.  Eyes:     General: No scleral icterus.       Right eye: No discharge.        Left eye: No discharge.     Extraocular Movements: Extraocular movements intact.     Conjunctiva/sclera: Conjunctivae normal.     Pupils: Pupils are equal, round, and reactive to light.  Cardiovascular:     Rate and Rhythm: Normal rate and regular rhythm.     Heart sounds: No murmur heard. Pulmonary:     Effort: Pulmonary effort is normal. No respiratory distress.     Breath sounds: Normal breath sounds. No wheezing, rhonchi or rales.  Musculoskeletal:     Cervical back: Neck supple.     Right lower leg: No edema.     Left lower leg: No edema.  Skin:    General: Skin is warm and dry.     Coloration: Skin is not jaundiced or pale.     Findings: No erythema or rash.  Neurological:     Mental Status: She is alert and oriented to person, place, and time. Mental status is at baseline.     Motor: No weakness.     Gait: Gait normal.  Psychiatric:        Mood and Affect: Mood normal.        Behavior: Behavior normal.        Thought Content: Thought content normal.        Judgment: Judgment normal.      No results found. No results found. No results found for this or any previous visit (from the past 24 hour(s)).  Assessment/Plan: Margaret Holmes is a 82 y.o. female present for OV for  Hypothyroidism Continue levothyroxine to 75 mcg daily for now.refills will be provided in appropriate dose based on lab result today TSH and  T4 free collected today Mixed hyperlipidemia Diet controlled - Comp Met (CMET) - Lipid panel - CBC Vitamin D deficiency - Vitamin D (25 hydroxy) Encounter for long-term current use of medication - Hemoglobin A1c  Allergies: Patient would like to have allergy medicine prescribed to see if insurance will help pay. Allegra 180 daily prescribed Return in about 24 weeks (around 06/05/2023).   Reviewed expectations re: course of current medical issues. Discussed self-management of symptoms. Outlined signs and symptoms indicating need for more acute intervention. Patient verbalized understanding and all questions were answered. Patient received an After-Visit Summary.    Orders Placed This Encounter  Procedures   Comp Met (CMET)   Lipid panel   Vitamin D (25 hydroxy)   CBC   TSH   T4, free   Hemoglobin A1c   Meds ordered this encounter  Medications   fexofenadine (ALLEGRA ALLERGY) 180 MG tablet    Sig: Take 1 tablet (180 mg total) by mouth daily.    Dispense:  30 tablet    Refill:  5   Referral Orders  No referral(s) requested today     Note is dictated utilizing voice recognition software. Although note has been proof read prior to signing, occasional typographical errors still can be missed. If any questions arise, please do not hesitate to call for verification.   electronically signed by:  Howard Pouch, DO  [place]

## 2022-12-19 NOTE — Patient Instructions (Addendum)
Return in about 24 weeks (around 06/05/2023).        Great to see you today.  I have refilled the medication(s) we provide.   If labs were collected, we will inform you of lab results once received either by echart message or telephone call.   - echart message- for normal results that have been seen by the patient already.   - telephone call: abnormal results or if patient has not viewed results in their echart.

## 2022-12-20 LAB — COMPREHENSIVE METABOLIC PANEL
ALT: 12 U/L (ref 0–35)
AST: 20 U/L (ref 0–37)
Albumin: 4.1 g/dL (ref 3.5–5.2)
Alkaline Phosphatase: 61 U/L (ref 39–117)
BUN: 15 mg/dL (ref 6–23)
CO2: 27 mEq/L (ref 19–32)
Calcium: 9.2 mg/dL (ref 8.4–10.5)
Chloride: 101 mEq/L (ref 96–112)
Creatinine, Ser: 1.24 mg/dL — ABNORMAL HIGH (ref 0.40–1.20)
GFR: 41.49 mL/min — ABNORMAL LOW (ref >60.00)
Glucose, Bld: 115 mg/dL — ABNORMAL HIGH (ref 70–99)
Potassium: 4.4 mEq/L (ref 3.5–5.1)
Sodium: 137 mEq/L (ref 135–145)
Total Bilirubin: 0.4 mg/dL (ref 0.2–1.2)
Total Protein: 6.7 g/dL (ref 6.0–8.3)

## 2022-12-20 LAB — LIPID PANEL
Cholesterol: 168 mg/dL (ref 0–200)
HDL: 45.5 mg/dL (ref >39.00)
LDL Cholesterol: 105 mg/dL — ABNORMAL HIGH (ref 0–99)
NonHDL: 122.38
Total CHOL/HDL Ratio: 4
Triglycerides: 85 mg/dL (ref 0.0–149.0)
VLDL: 17 mg/dL (ref 0.0–40.0)

## 2022-12-21 ENCOUNTER — Telehealth: Payer: Self-pay | Admitting: Family Medicine

## 2022-12-21 DIAGNOSIS — N1831 Chronic kidney disease, stage 3a: Secondary | ICD-10-CM | POA: Insufficient documentation

## 2022-12-21 NOTE — Telephone Encounter (Signed)
Spoke with pt regarding labs and instructions.   

## 2022-12-21 NOTE — Telephone Encounter (Signed)
Please call patient Margaret Holmes liver function, thyroid function, electrolytes, cholesterol levels, blood cell counts are all normal. Diabetes/sugar screening is normal Kidney function is just mildly decreased are normal-I would encourage Margaret Holmes to make sure she is hydrating well daily.  Keeping herself well-hydrated, and good control of blood pressure and sugar will help avoid worsening of Margaret Holmes kidney function.  She asks, this can be something that happens as we get older.  Controlling the factors mentioned above and avoiding using anti-inflammatories daily such as Advil/naproxen/Motrin will help preserve kidney function  Vitamin D is lower than normal at 23.  I would encourage Margaret Holmes to add 1000 units of vitamin D supplementation daily.

## 2022-12-27 DIAGNOSIS — S61212D Laceration without foreign body of right middle finger without damage to nail, subsequent encounter: Secondary | ICD-10-CM | POA: Diagnosis not present

## 2023-03-01 ENCOUNTER — Ambulatory Visit: Payer: Medicare PPO | Admitting: Family Medicine

## 2023-03-01 ENCOUNTER — Encounter: Payer: Self-pay | Admitting: Family Medicine

## 2023-03-01 VITALS — BP 116/66 | HR 70 | Temp 97.8°F | Wt 114.6 lb

## 2023-03-01 DIAGNOSIS — S8990XA Unspecified injury of unspecified lower leg, initial encounter: Secondary | ICD-10-CM | POA: Diagnosis not present

## 2023-03-01 DIAGNOSIS — I8310 Varicose veins of unspecified lower extremity with inflammation: Secondary | ICD-10-CM

## 2023-03-01 MED ORDER — NAPROXEN 500 MG PO TABS: 500.0000 mg | ORAL_TABLET | Freq: Two times a day (BID) | ORAL | 0 refills | Status: DC

## 2023-03-01 MED ORDER — AMOXICILLIN-POT CLAVULANATE 875-125 MG PO TABS: 1.0000 | ORAL_TABLET | Freq: Two times a day (BID) | ORAL | 0 refills | Status: DC

## 2023-03-01 NOTE — Patient Instructions (Addendum)
No follow-ups on file.   Start augmentin every 12 hours  with food for 7 days Start naproxen every 12 hours with food for 5-7 days Referral placed for vascular/vein on [address]      Great to see you today.  I have refilled the medication(s) we provide.   If labs were collected, we will inform you of lab results once received either by echart message or telephone call.   - echart message- for normal results that have been seen by the patient already.   - telephone call: abnormal results or if patient has not viewed results in their echart.

## 2023-03-01 NOTE — Progress Notes (Unsigned)
Margaret Holmes , 1941-03-17, 82 y.o., female MRN: [id number] Patient Care Team    Relationship Specialty Notifications Start End  Natalia Leatherwood, DO PCP - General Family Medicine  04/09/16   Sharrell Ku, MD Consulting Physician Gastroenterology  05/23/16   Romualdo Bolk, MD Consulting Physician Obstetrics and Gynecology  05/23/16   Theodosia Blender, MD Referring Physician Urology  08/27/16   [facility]  Optometry  09/01/18     Chief Complaint  Patient presents with   Leg Injury    Cut on right leg from wood. Called EMS Wed night because it would not stop bleeding; happened 3 weeks ago; currently swollen     Subjective: Margaret Holmes is a 82 y.o. Pt presents for an OV with complaints of another wood splitting injury of her lower extremity of 3 weeks duration.  Associated symptoms include swelling. EMS was called at the time of event to aide in hemostasis.      03/01/2023   10:58 AM 10/17/2022    1:03 PM 05/23/2022    2:42 PM 01/08/2022   10:06 AM 06/01/2021    8:06 AM  Depression screen PHQ 2/9  Decreased Interest 0 0 0 0 0  Down, Depressed, Hopeless 0 0 0 0 0  PHQ - 2 Score 0 0 0 0 0    Allergies  Allergen Reactions   Diazepam Other (See Comments)    Psychiatric reaction Caused forgetfulness   Codeine Other (See Comments)    Not specified in old records.   Darvocet [Propoxyphene N-Acetaminophen] Other (See Comments)    Not specified in old records   Hydrocodone Other (See Comments)    Not specified in old records   Social History   Social History Narrative   Married, 3 children, 6 grandchildren, 1 GGc.   Orig from this region.  Drives a school bus (X 31 yrs).   No T/A/Ds.   Caffeine: one cup coffee a day, 3 glasses of sweet tea per day.   No formal exercise.  She is active (NOT sedentary).            Past Medical History:  Diagnosis Date   Adnexal cyst 07/2013   Left-3cm-noted on CT abd/pelv--f/u pelvic u/s did not  show anything (L ovary not visualized, likely secondary to bowel gas)-repeat pelvic u/s after 07/2014.   Anemia    Asthma    DDD (degenerative disc disease), cervical    H/O angular cheilitis    Zinc normal, vit B12 low normal (2012)   H/O vitamin D deficiency 2011   Came back to normal range with replacement therapydd   History of adenomatous polyp of colon 2003, 2006, 2013;2016   Repeat 2013 showed tubular adenoma x 3, with no high grade dysplasia.  2016 no polyps-recall 5 yrs   History of hiatal hernia    Hyperlipidemia    per pt report 05/09/12; also on labs 06/2015.  Intolerant of pravachol 02/2016   Hypothyroidism    Kidney infection    Lower leg pain    Bilateral, crampy--ABIs/dopplers NORMAL 04/2013.   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  +internal hem.  Normal ileoscopy.   DILATATION & CURETTAGE/HYSTEROSCOPY WITH MYOSURE N/A 05/28/2016   Procedure: DILATATION & CURETTAGE/HYSTEROSCOPY WITH MYOSURE with ULTRASOUND guidance;  Surgeon: Romualdo Bolk, MD;  Location: [facility];  Service: Gynecology;  Laterality: N/A;  Please have ultrasound in room. Follow Silva's first case.   GANGLION CYST EXCISION  2003   Right wrist, with excision of some triquetrum spurs   stent placed right kidney     THYROIDECTOMY, PARTIAL  1972   TONSILLECTOMY AND ADENOIDECTOMY  age 13   TUBAL LIGATION     WRIST SURGERY  2006   For left thumb carpo-metacarpal arthritis   Family History  Problem Relation Age of Onset   Cancer Mother        ovarian and throat cancer   Diabetes Mother     Alcohol abuse Father        alcoholism.  Died age 33   Cancer Maternal Grandmother        breast cancer   Allergies as of 03/01/2023       Reactions   Diazepam Other (See Comments)   Psychiatric reaction Caused forgetfulness   Codeine Other (See Comments)   Not specified in old records.   Darvocet [propoxyphene N-acetaminophen] Other (See Comments)   Not specified in old records   Hydrocodone Other (See Comments)   Not specified in old records        Medication List        Accurate as of Mar 01, 2023 11:00 AM. If you have any questions, ask your nurse or doctor.          cyanocobalamin 1000 MCG tablet Commonly known as: VITAMIN B12 Take 1 tablet (1,000 mcg total) by mouth daily.   diclofenac sodium 1 % Gel Commonly known as: VOLTAREN Apply over affected area QID RPN   fexofenadine 180 MG tablet Commonly known as: Allegra Allergy Take 1 tablet (180 mg total) by mouth daily.   fluticasone 50 MCG/ACT nasal spray Commonly known as: FLONASE Place into the nose.   levothyroxine 75 MCG tablet Commonly known as: Synthroid Take 1 tablet (75 mcg total) by mouth daily before breakfast.   VITAMIN C-VITAMIN D-ZINC PO Take by mouth daily.        All past medical history, surgical history, allergies, family history, immunizations andmedications were updated in the EMR today and reviewed under the history and medication portions of their EMR.     ROS Negative, with the exception of above mentioned in HPI   Objective:  BP 116/66   Pulse 70   Temp 97.8 F (36.6 C)   Wt 114 lb 9.6 oz (52 kg)   SpO2 95%   BMI 20.96 kg/m  Body mass index is 20.96 kg/m.  Physical Exam   No results found. No results found. No results found for this or any previous visit (from the past 24 hour(s)).  Assessment/Plan: Margaret Holmes is a 82 y.o. female present for OV for  *** Reviewed expectations re: course of current medical issues. Discussed self-management of  symptoms. Outlined signs and symptoms indicating need for more acute intervention. Patient verbalized understanding and all questions were answered. Patient received an After-Visit Summary.    No orders of the defined types were placed in this encounter.  No orders of the defined types were placed in this encounter.  Referral Orders  No  referral(s) requested today     Note is dictated utilizing voice recognition software. Although note has been proof read prior to signing, occasional typographical errors still can be missed. If any questions arise, please do not hesitate to call for verification.   electronically signed by:  Howard Pouch, DO  [place]

## 2023-03-05 ENCOUNTER — Other Ambulatory Visit: Payer: Self-pay | Admitting: *Deleted

## 2023-03-05 DIAGNOSIS — I8393 Asymptomatic varicose veins of bilateral lower extremities: Secondary | ICD-10-CM

## 2023-03-07 ENCOUNTER — Ambulatory Visit (HOSPITAL_COMMUNITY)
Admission: RE | Admit: 2023-03-07 | Discharge: 2023-03-07 | Disposition: A | Discharge status: Home / Self Care | Payer: Medicare PPO | Source: Ambulatory Visit | Attending: Vascular Surgery | Admitting: Vascular Surgery

## 2023-03-07 ENCOUNTER — Encounter (HOSPITAL_COMMUNITY): Payer: Medicare PPO

## 2023-03-07 DIAGNOSIS — I8393 Asymptomatic varicose veins of bilateral lower extremities: Secondary | ICD-10-CM | POA: Insufficient documentation

## 2023-03-11 NOTE — Progress Notes (Unsigned)
[facility] [place]  ASSESSMENT / PLAN: Margaret Holmes is a 82 y.o. female with chronic venous insufficiency of right lower extremity causing venous ulceration (C6 disease).  Venous duplex is significant for deep and superficial vein reflux. Anatomy does not appear favorable for ablation. Unna boot applied today.  Will try to arrange home health RN for [employer]. If unable to arrange this through her insurance, will change this in our office weekly.  CHIEF COMPLAINT: swollen leg; ulcer  HISTORY OF PRESENT ILLNESS: Margaret Holmes is a 82 y.o. female referred to clinic for evaluation of ulceration over the right anterior calf.  Patient reports a injury sustained while mowing her lawn.  This is not healed.  She has noticed significant swelling in her leg.  She has stigmata of chronic venous insufficiency bilaterally.  She has never had significant swelling before.  We reviewed her noninvasive findings.  I reassured her about the benign nature of chronic venous insufficiency.  Past Medical History:  Diagnosis Date   Adnexal cyst 07/2013   Left-3cm-noted on CT abd/pelv--f/u pelvic u/s did not show anything (L ovary not visualized, likely secondary to bowel gas)-repeat pelvic u/s after 07/2014.   Anemia    Asthma    DDD (degenerative disc disease), cervical    H/O angular cheilitis    Zinc normal, vit B12 low normal (2012)   H/O vitamin D deficiency 2011   Came back to normal range with replacement therapydd   History of adenomatous polyp of colon 2003, 2006, 2013;2016   Repeat 2013 showed tubular adenoma x 3, with no high grade dysplasia.  2016 no polyps-recall 5 yrs   History of hiatal hernia    Hyperlipidemia    per pt report 05/09/12; also on labs 06/2015.  Intolerant of pravachol 02/2016   Hypothyroidism    Kidney infection    Lower leg pain    Bilateral, crampy--ABIs/dopplers NORMAL 04/2013.   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  +internal hem.  Normal ileoscopy.   DILATATION & CURETTAGE/HYSTEROSCOPY WITH MYOSURE N/A 05/28/2016   Procedure: DILATATION & CURETTAGE/HYSTEROSCOPY WITH MYOSURE with ULTRASOUND guidance;  Surgeon: Romualdo Bolk, MD;  Location: [facility];  Service: Gynecology;  Laterality: N/A;  Please have ultrasound in room. Follow Silva's first case.   GANGLION CYST EXCISION  2003   Right wrist, with excision of some triquetrum spurs   stent placed right kidney     THYROIDECTOMY, PARTIAL  1972   TONSILLECTOMY AND ADENOIDECTOMY  age 61   TUBAL LIGATION     WRIST SURGERY  2006   For left thumb carpo-metacarpal arthritis    Family History  Problem Relation Age of Onset   Cancer Mother        ovarian and throat cancer   Diabetes Mother    Alcohol abuse Father        alcoholism.  Died age 78   Cancer Maternal Grandmother        breast cancer    Social History   Socioeconomic History  Marital status: Married    Spouse name: Not on file   Number of children: Not on file   Years of education: Not on file   Highest education level: Not on file  Occupational History   Not on file  Tobacco Use   Smoking status: Never   Smokeless tobacco: Never  Vaping Use   Vaping Use: Never used  Substance and Sexual Activity   Alcohol use: No    Alcohol/week: 0.0 standard drinks of alcohol   Drug use: No   Sexual activity: Not Currently  Other Topics Concern   Not on file  Social History Narrative   Married, 3 children, 6 grandchildren, 1 GGc.   Orig from this region.  Drives a school bus (X 31 yrs).    No T/A/Ds.   Caffeine: one cup coffee a day, 3 glasses of sweet tea per day.   No formal exercise.  She is active (NOT sedentary).            Social Determinants of Health   Financial Resource Strain: Low Risk  (05/23/2022)   Overall Financial Resource Strain (CARDIA)    Difficulty of Paying Living Expenses: Not hard at all  Food Insecurity: No Food Insecurity (05/23/2022)   Hunger Vital Sign    Worried About Running Out of Food in the Last Year: Never true    Ran Out of Food in the Last Year: Never true  Transportation Needs: No Transportation Needs (05/23/2022)   PRAPARE - Administrator, Civil Service (Medical): No    Lack of Transportation (Non-Medical): No  Physical Activity: Sufficiently Active (05/23/2022)   Exercise Vital Sign    Days of Exercise per Week: 5 days    Minutes of Exercise per Session: 60 min  Stress: No Stress Concern Present (05/23/2022)   [employer] of Occupational Health - Occupational Stress Questionnaire    Feeling of Stress : Not at all  Social Connections: Moderately Integrated (05/23/2022)   Social Connection and Isolation Panel [NHANES]    Frequency of Communication with Friends and Family: More than three times a week    Frequency of Social Gatherings with Friends and Family: More than three times a week    Attends Religious Services: 1 to 4 times per year    Active Member of [employer] or Organizations: No    Attends Banker Meetings: Never    Marital Status: Married  Catering manager Violence: Not At Risk (05/23/2022)   Humiliation, Afraid, Rape, and Kick questionnaire    Fear of Current or Ex-Partner: No    Emotionally Abused: No    Physically Abused: No    Sexually Abused: No    Allergies  Allergen Reactions   Diazepam Other (See Comments)    Psychiatric reaction Caused forgetfulness   Codeine Other (See Comments)    Not specified in old records.   Darvocet [Propoxyphene N-Acetaminophen] Other (See Comments)     Not specified in old records   Hydrocodone Other (See Comments)    Not specified in old records    Current Outpatient Medications  Medication Sig Dispense Refill   amoxicillin-clavulanate (AUGMENTIN) 875-125 MG tablet Take 1 tablet by mouth 2 (two) times daily. 14 tablet 0   diclofenac sodium (VOLTAREN) 1 % GEL Apply over affected area QID RPN 100 g 5   fexofenadine (ALLEGRA ALLERGY) 180 MG tablet Take 1 tablet (180 mg total) by mouth daily. 30 tablet 5   fluticasone (FLONASE) 50 MCG/ACT nasal spray Place  into the nose.     levothyroxine (SYNTHROID) 75 MCG tablet Take 1 tablet (75 mcg total) by mouth daily before breakfast. 90 tablet 3   naproxen (NAPROSYN) 500 MG tablet Take 1 tablet (500 mg total) by mouth 2 (two) times daily with a meal. 30 tablet 0   vitamin B-12 (CYANOCOBALAMIN) 1000 MCG tablet Take 1 tablet (1,000 mcg total) by mouth daily. 90 tablet 3   VITAMIN C-VITAMIN D-ZINC PO Take by mouth daily.     No current facility-administered medications for this visit.    PHYSICAL EXAM There were no vitals filed for this visit.  Well-appearing elderly woman in no acute distress Regular rate and rhythm Unlabored breathing 2+ edema about the right calf from the midfoot to the knee joint.  There is a 2 x 3 cm venous ulceration over the anterior compartment of the calf.  2+ dorsalis pedis pulses bilaterally.  Prominent reticular and varicose veins bilaterally.  PERTINENT LABORATORY AND RADIOLOGIC DATA  Most recent CBC    Latest Ref Rng & Units 12/19/2022   10:53 AM 06/01/2021    8:29 AM 06/15/2020    9:09 AM  CBC  WBC 4.0 - 10.5 K/uL 5.4  5.3  5.6   Hemoglobin 12.0 - 15.0 g/dL 16.1  09.6  04.5   Hematocrit 36.0 - 46.0 % 37.2  37.3  39.2   Platelets 150.0 - 400.0 K/uL 246.0  223.0  257      Most recent CMP    Latest Ref Rng & Units 12/19/2022   10:53 AM 06/01/2021    8:29 AM 06/15/2020    9:09 AM  CMP  Glucose 70 - 99 mg/dL 409  93  96   BUN 6 - 23 mg/dL 15  16  10     Creatinine 0.40 - 1.20 mg/dL 8.11  9.14  7.82   Sodium 135 - 145 mEq/L 137  139  142   Potassium 3.5 - 5.1 mEq/L 4.4  4.1  4.5   Chloride 96 - 112 mEq/L 101  103  104   CO2 19 - 32 mEq/L 27  30  29    Calcium 8.4 - 10.5 mg/dL 9.2  8.8  9.3   Total Protein 6.0 - 8.3 g/dL 6.7  6.5  6.9    6.9   Total Bilirubin 0.2 - 1.2 mg/dL 0.4  0.7  0.6   Alkaline Phos 39 - 117 U/L 61  48    AST 0 - 37 U/L 20  18  17    ALT 0 - 35 U/L 12  10  11      Renal function CrCl cannot be calculated (Patient's most recent lab result is older than the maximum 21 days allowed.).  Hgb A1c MFr Bld (%)  Date Value  12/19/2022 5.7    LDL Cholesterol (Calc)  Date Value Ref Range Status  07/24/2019 112 (H) mg/dL (calc) Final    Comment:    Reference range: <100 . Desirable range <100 mg/dL for primary prevention;   <70 mg/dL for patients with CHD or diabetic patients  with > or = 2 CHD risk factors. Marland Kitchen LDL-C is now calculated using the Martin-Hopkins  calculation, which is a validated novel method providing  better accuracy than the Friedewald equation in the  estimation of LDL-C.  Horald Pollen et al. Lenox Ahr. 9562;130(86): 2061-2068  (http://education.[facility].com/faq/FAQ164)    LDL Cholesterol  Date Value Ref Range Status  12/19/2022 105 (H) 0 - 99 mg/dL Final   Direct LDL  Date Value Ref Range Status  05/14/2012 143.5 mg/dL Final    Comment:    Optimal:  <100 mg/dLNear or Above Optimal:  100-129 mg/dLBorderline High:  130-159 mg/dLHigh:  160-189 mg/dLVery High:  >190 mg/dL    Right:  - No evidence of deep vein thrombosis seen in the right lower extremity,  from the common femoral through the popliteal veins.  - Color duplex evaluation of the right lower extremity shows there is  thrombus in the lesser saphenous vein, probably chronic.  - Venous reflux is noted in the right sapheno-femoral junction.  - Venous reflux is noted in the right greater saphenous vein in the calf.  - Venous reflux  is noted in the right femoral vein.  - Enlarged lymph nodes visualized in the groin.      Rande Brunt. Lenell Antu, MD FACS [facility] Phone Number: [phone] 03/11/2023 10:15 AM   Total time spent on preparing this encounter including chart review, data review, collecting history, examining the patient, coordinating care for this new patient, 60 minutes.  Portions of this report may have been transcribed using voice recognition software.  Every effort has been made to ensure accuracy; however, inadvertent computerized transcription errors may still be present.

## 2023-03-12 ENCOUNTER — Telehealth: Payer: Self-pay

## 2023-03-12 ENCOUNTER — Ambulatory Visit: Payer: Medicare PPO | Admitting: Vascular Surgery

## 2023-03-12 ENCOUNTER — Encounter: Payer: Self-pay | Admitting: Vascular Surgery

## 2023-03-12 VITALS — BP 128/75 | HR 74 | Temp 97.9°F | Resp 20 | Ht 62.0 in | Wt 115.0 lb

## 2023-03-12 DIAGNOSIS — L97909 Non-pressure chronic ulcer of unspecified part of unspecified lower leg with unspecified severity: Secondary | ICD-10-CM | POA: Diagnosis not present

## 2023-03-12 DIAGNOSIS — I87319 Chronic venous hypertension (idiopathic) with ulcer of unspecified lower extremity: Secondary | ICD-10-CM

## 2023-03-12 NOTE — Telephone Encounter (Signed)
Type of forms received: [facility] Handicap Placard  Routed to: Team [facility]  Paperwork received by :  via phone request   Individual made aware of 5-7 business day turn around (Y/N): Y  needs today or tomorrow morning if possible  Form completed and patient made aware of charges(Y/N): Y  Please call patients' husband's cell [phone] when ready for pick up  Form location:  [facility] inbox front office area

## 2023-03-13 NOTE — Telephone Encounter (Signed)
Placed on PCP desk for completion  

## 2023-03-13 NOTE — Telephone Encounter (Signed)
Completed and returned to CMA work basket ?

## 2023-03-13 NOTE — Telephone Encounter (Signed)
Pt was advised of form completion. Husband will pick up form today. Placed up front in brown folder

## 2023-03-19 ENCOUNTER — Ambulatory Visit: Payer: Medicare PPO | Admitting: Physician Assistant

## 2023-03-19 VITALS — BP 144/69 | HR 55 | Temp 97.8°F | Wt 115.0 lb

## 2023-03-19 DIAGNOSIS — I87311 Chronic venous hypertension (idiopathic) with ulcer of right lower extremity: Secondary | ICD-10-CM

## 2023-03-19 DIAGNOSIS — L97909 Non-pressure chronic ulcer of unspecified part of unspecified lower leg with unspecified severity: Secondary | ICD-10-CM

## 2023-03-19 NOTE — Progress Notes (Signed)
  POST OPERATIVE OFFICE NOTE    CC:  F/u for surgery  HPI:  This is a 82 y.o. female who is for follow up.  She was originally referred to clinic for evaluation of ulceration over the right anterior shin.  The Patient reports a injury sustained while mowing her lawn.  She denies fever or chills.  She denies claudication, rest pain or non healing ulcers.    She stays very busy looking after her grand kids and keeping up her land.      Allergies  Allergen Reactions   Diazepam Other (See Comments)    Psychiatric reaction Caused forgetfulness   Codeine Other (See Comments)    Not specified in old records.   Darvocet [Propoxyphene N-Acetaminophen] Other (See Comments)    Not specified in old records   Hydrocodone Other (See Comments)    Not specified in old records    Current Outpatient Medications  Medication Sig Dispense Refill   amoxicillin-clavulanate (AUGMENTIN) 875-125 MG tablet Take 1 tablet by mouth 2 (two) times daily. 14 tablet 0   diclofenac sodium (VOLTAREN) 1 % GEL Apply over affected area QID RPN 100 g 5   fexofenadine (ALLEGRA ALLERGY) 180 MG tablet Take 1 tablet (180 mg total) by mouth daily. 30 tablet 5   fluticasone (FLONASE) 50 MCG/ACT nasal spray Place into the nose.     levothyroxine (SYNTHROID) 75 MCG tablet Take 1 tablet (75 mcg total) by mouth daily before breakfast. 90 tablet 3   naproxen (NAPROSYN) 500 MG tablet Take 1 tablet (500 mg total) by mouth 2 (two) times daily with a meal. 30 tablet 0   vitamin B-12 (CYANOCOBALAMIN) 1000 MCG tablet Take 1 tablet (1,000 mcg total) by mouth daily. 90 tablet 3   VITAMIN C-VITAMIN D-ZINC PO Take by mouth daily.     No current facility-administered medications for this visit.     ROS:  See HPI  Physical Exam:     Extremities:  right lateral anterior shin wound without signs of infection.  No erythema or edema there is clear drainage Neuro: sensation intact Heart: RRR   Lungs non labored  breathing   Assessment/Plan:   Margaret Holmes is a 82 y.o. female referred to clinic for evaluation of ulceration over the right anterior shin  Patient reports a injury sustained while mowing her lawn. She has noticed significant swelling in her leg. She has stigmata of chronic venous insufficiency bilaterally.   -Una boot placed and hand out given about skin care, elevation and compression.  Once the wound has healed she will re apply her compression garments.  I referred her to [facility] RN for [employer] changes and if they are not covered by insurance she will f/u here in 1 week.     Mosetta Pigeon PA-C [facility] [phone]   Clinic MD:  Lenell Antu

## 2023-03-26 ENCOUNTER — Ambulatory Visit: Payer: Medicare PPO | Admitting: Physician Assistant

## 2023-03-26 ENCOUNTER — Other Ambulatory Visit: Payer: Self-pay

## 2023-03-26 VITALS — BP 122/75 | HR 63 | Temp 98.3°F | Resp 18 | Ht 62.0 in | Wt 112.0 lb

## 2023-03-26 DIAGNOSIS — I87311 Chronic venous hypertension (idiopathic) with ulcer of right lower extremity: Secondary | ICD-10-CM | POA: Diagnosis not present

## 2023-03-26 DIAGNOSIS — L97909 Non-pressure chronic ulcer of unspecified part of unspecified lower leg with unspecified severity: Secondary | ICD-10-CM

## 2023-03-26 NOTE — Progress Notes (Signed)
Pt returns today for unna boot change.    Skin tore where the unna boot was removed, but actual venous sore improved from previous picture.  Pt has palpable DP pulse on the right.   Discussed with Dr. Lenell Antu.  Will replace unna boot today and place xeroform over area of skin tear.    Nurse to reach out to [facility] for unna boot change next week at home and then she will return in 2 weeks in our office for wound check.    Pt is in agreement with this plan.    Doreatha Massed, [facility] 03/26/2023 9:34 AM

## 2023-03-28 ENCOUNTER — Encounter (HOSPITAL_COMMUNITY): Payer: Medicare PPO

## 2023-04-01 DIAGNOSIS — I87311 Chronic venous hypertension (idiopathic) with ulcer of right lower extremity: Secondary | ICD-10-CM | POA: Diagnosis not present

## 2023-04-01 DIAGNOSIS — L97811 Non-pressure chronic ulcer of other part of right lower leg limited to breakdown of skin: Secondary | ICD-10-CM | POA: Diagnosis not present

## 2023-04-08 DIAGNOSIS — I87311 Chronic venous hypertension (idiopathic) with ulcer of right lower extremity: Secondary | ICD-10-CM | POA: Diagnosis not present

## 2023-04-08 DIAGNOSIS — L97811 Non-pressure chronic ulcer of other part of right lower leg limited to breakdown of skin: Secondary | ICD-10-CM | POA: Diagnosis not present

## 2023-04-15 DIAGNOSIS — L97811 Non-pressure chronic ulcer of other part of right lower leg limited to breakdown of skin: Secondary | ICD-10-CM | POA: Diagnosis not present

## 2023-04-15 DIAGNOSIS — I87311 Chronic venous hypertension (idiopathic) with ulcer of right lower extremity: Secondary | ICD-10-CM | POA: Diagnosis not present

## 2023-04-23 DIAGNOSIS — L97811 Non-pressure chronic ulcer of other part of right lower leg limited to breakdown of skin: Secondary | ICD-10-CM | POA: Diagnosis not present

## 2023-04-23 DIAGNOSIS — I87311 Chronic venous hypertension (idiopathic) with ulcer of right lower extremity: Secondary | ICD-10-CM | POA: Diagnosis not present

## 2023-04-30 DIAGNOSIS — L97811 Non-pressure chronic ulcer of other part of right lower leg limited to breakdown of skin: Secondary | ICD-10-CM | POA: Diagnosis not present

## 2023-04-30 DIAGNOSIS — I87311 Chronic venous hypertension (idiopathic) with ulcer of right lower extremity: Secondary | ICD-10-CM | POA: Diagnosis not present

## 2023-05-28 ENCOUNTER — Other Ambulatory Visit: Payer: Self-pay | Admitting: Family Medicine

## 2023-06-18 NOTE — Progress Notes (Unsigned)
Margaret Holmes , August 25, 1941, 82 y.o., female MRN: [id number] Patient Care Team    Relationship Specialty Notifications Start End  Natalia Leatherwood, DO PCP - General Family Medicine  04/09/16   Sharrell Ku, MD Consulting Physician Gastroenterology  05/23/16   Romualdo Bolk, MD (Inactive) Consulting Physician Obstetrics and Gynecology  05/23/16   Theodosia Blender, MD Referring Physician Urology  08/27/16   [facility]  09/01/18     No chief complaint on file.    Subjective: Margaret Holmes is a 82 y.o. Pt presents for an OV with complaints of *** of *** duration.  Associated symptoms include ***.  Pt has tried *** to ease their symptoms.      03/01/2023   10:58 AM 10/17/2022    1:03 PM 05/23/2022    2:42 PM 01/08/2022   10:06 AM 06/01/2021    8:06 AM  Depression screen PHQ 2/9  Decreased Interest 0 0 0 0 0  Down, Depressed, Hopeless 0 0 0 0 0  PHQ - 2 Score 0 0 0 0 0    Allergies  Allergen Reactions   Diazepam Other (See Comments)    Psychiatric reaction Caused forgetfulness   Codeine Other (See Comments)    Not specified in old records.   Darvocet [Propoxyphene N-Acetaminophen] Other (See Comments)    Not specified in old records   Hydrocodone Other (See Comments)    Not specified in old records   Social History   Social History Narrative   Married, 3 children, 6 grandchildren, 1 GGc.   Orig from this region.  Drives a school bus (X 31 yrs).   No T/A/Ds.   Caffeine: one cup coffee a day, 3 glasses of sweet tea per day.   No formal exercise.  She is active (NOT sedentary).            Past Medical History:  Diagnosis Date   Adnexal cyst 07/2013   Left-3cm-noted on CT abd/pelv--f/u pelvic u/s did not show anything (L ovary not visualized, likely secondary to bowel gas)-repeat pelvic u/s after 07/2014.   Anemia    Asthma    DDD (degenerative disc disease), cervical    H/O angular cheilitis    Zinc normal, vit  B12 low normal (2012)   H/O vitamin D deficiency 2011   Came back to normal range with replacement therapydd   History of adenomatous polyp of colon 2003, 2006, 2013;2016   Repeat 2013 showed tubular adenoma x 3, with no high grade dysplasia.  2016 no polyps-recall 5 yrs   History of hiatal hernia    Hyperlipidemia    per pt report 05/09/12; also on labs 06/2015.  Intolerant of pravachol 02/2016   Hypothyroidism    Kidney infection    Lower leg pain    Bilateral, crampy--ABIs/dopplers NORMAL 04/2013.   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  +internal hem.  Normal ileoscopy.   DILATATION & CURETTAGE/HYSTEROSCOPY WITH MYOSURE N/A 05/28/2016   Procedure: DILATATION & CURETTAGE/HYSTEROSCOPY  WITH MYOSURE with ULTRASOUND guidance;  Surgeon: Romualdo Bolk, MD;  Location: [facility];  Service: Gynecology;  Laterality: N/A;  Please have ultrasound in room. Follow Silva's first case.   GANGLION CYST EXCISION  2003   Right wrist, with excision of some triquetrum spurs   stent placed right kidney     THYROIDECTOMY, PARTIAL  1972   TONSILLECTOMY AND ADENOIDECTOMY  age 48   TUBAL LIGATION     WRIST SURGERY  2006   For left thumb carpo-metacarpal arthritis   Family History  Problem Relation Age of Onset   Cancer Mother        ovarian and throat cancer   Diabetes Mother    Alcohol abuse Father        alcoholism.  Died age 77   Cancer Maternal Grandmother        breast cancer   Allergies as of 06/19/2023       Reactions   Diazepam Other (See Comments)   Psychiatric  reaction Caused forgetfulness   Codeine Other (See Comments)   Not specified in old records.   Darvocet [propoxyphene N-acetaminophen] Other (See Comments)   Not specified in old records   Hydrocodone Other (See Comments)   Not specified in old records        Medication List        Accurate as of June 18, 2023  2:04 PM. If you have any questions, ask your nurse or doctor.          amoxicillin-clavulanate 875-125 MG tablet Commonly known as: AUGMENTIN Take 1 tablet by mouth 2 (two) times daily.   cyanocobalamin 1000 MCG tablet Commonly known as: VITAMIN B12 Take 1 tablet (1,000 mcg total) by mouth daily.   diclofenac sodium 1 % Gel Commonly known as: VOLTAREN Apply over affected area QID RPN   fexofenadine 180 MG tablet Commonly known as: ALLEGRA TAKE 1 TABLET BY MOUTH DAILY   fluticasone 50 MCG/ACT nasal spray Commonly known as: FLONASE Place into the nose.   levothyroxine 75 MCG tablet Commonly known as: Synthroid Take 1 tablet (75 mcg total) by mouth daily before breakfast.   naproxen 500 MG tablet Commonly known as: Naprosyn Take 1 tablet (500 mg total) by mouth 2 (two) times daily with a meal.   VITAMIN C-VITAMIN D-ZINC PO Take by mouth daily.        All past medical history, surgical history, allergies, family history, immunizations andmedications were updated in the EMR today and reviewed under the history and medication portions of their EMR.     ROS Negative, with the exception of above mentioned in HPI   Objective:  There were no vitals taken for this visit. There is no height or weight on file to calculate BMI.  Physical Exam   No results found. No results found. No results found for this or any previous visit (from the past 24 hour(s)).  Assessment/Plan: Margaret Holmes is a 83 y.o. female present for OV for  *** Reviewed expectations re: course of current medical issues. Discussed self-management of symptoms. Outlined signs  and symptoms indicating need for more acute intervention. Patient verbalized understanding and all questions were answered. Patient received an After-Visit Summary.    No orders of the defined types were placed in this encounter.  No orders of the defined types were placed in this encounter.  Referral Orders  No referral(s) requested today     Note is dictated utilizing voice recognition software. Although note has been  proof read prior to signing, occasional typographical errors still can be missed. If any questions arise, please do not hesitate to call for verification.   electronically signed by:  Felix Pacini, DO  [facility] - [place]

## 2023-06-19 ENCOUNTER — Encounter: Payer: Self-pay | Admitting: Family Medicine

## 2023-06-19 ENCOUNTER — Ambulatory Visit (INDEPENDENT_AMBULATORY_CARE_PROVIDER_SITE_OTHER): Payer: Medicare PPO | Admitting: Family Medicine

## 2023-06-19 VITALS — BP 130/70 | HR 64 | Temp 97.6°F | Wt 112.2 lb

## 2023-06-19 DIAGNOSIS — Z23 Encounter for immunization: Secondary | ICD-10-CM | POA: Diagnosis not present

## 2023-06-19 DIAGNOSIS — T148XXA Other injury of unspecified body region, initial encounter: Secondary | ICD-10-CM

## 2023-06-19 DIAGNOSIS — S80811A Abrasion, right lower leg, initial encounter: Secondary | ICD-10-CM | POA: Diagnosis not present

## 2023-06-19 NOTE — Patient Instructions (Signed)
Keep an eye on area on the back of her leg. Make sure it heals without infection. If worsens please be seen again.   Use the bag balm on your legs and over the area of concern.

## 2023-06-26 NOTE — Addendum Note (Signed)
Addended by: Filomena Jungling on: 06/26/2023 03:04 PM   Modules accepted: Orders

## 2023-06-27 NOTE — Addendum Note (Signed)
Addended by: Filomena Jungling on: 06/27/2023 09:28 AM   Modules accepted: Orders

## 2023-07-18 ENCOUNTER — Other Ambulatory Visit: Payer: Self-pay | Admitting: Family Medicine

## 2023-08-09 ENCOUNTER — Encounter: Payer: Medicare PPO | Admitting: Family Medicine

## 2023-08-12 ENCOUNTER — Ambulatory Visit: Payer: Medicare PPO | Admitting: Family Medicine

## 2023-08-12 ENCOUNTER — Encounter: Payer: Self-pay | Admitting: Family Medicine

## 2023-08-12 VITALS — BP 112/72 | HR 62 | Temp 97.8°F | Ht 62.0 in | Wt 110.4 lb

## 2023-08-12 DIAGNOSIS — Z131 Encounter for screening for diabetes mellitus: Secondary | ICD-10-CM

## 2023-08-12 DIAGNOSIS — E559 Vitamin D deficiency, unspecified: Secondary | ICD-10-CM | POA: Diagnosis not present

## 2023-08-12 DIAGNOSIS — E782 Mixed hyperlipidemia: Secondary | ICD-10-CM

## 2023-08-12 DIAGNOSIS — Z0000 Encounter for general adult medical examination without abnormal findings: Secondary | ICD-10-CM

## 2023-08-12 DIAGNOSIS — E039 Hypothyroidism, unspecified: Secondary | ICD-10-CM | POA: Diagnosis not present

## 2023-08-12 DIAGNOSIS — N1831 Chronic kidney disease, stage 3a: Secondary | ICD-10-CM

## 2023-08-12 DIAGNOSIS — Z1231 Encounter for screening mammogram for malignant neoplasm of breast: Secondary | ICD-10-CM

## 2023-08-12 DIAGNOSIS — E611 Iron deficiency: Secondary | ICD-10-CM

## 2023-08-12 LAB — LIPID PANEL
Cholesterol: 185 mg/dL (ref 0–200)
HDL: 46.5 mg/dL (ref >39.00)
LDL Cholesterol: 123 mg/dL — ABNORMAL HIGH (ref 0–99)
NonHDL: 138.42
Total CHOL/HDL Ratio: 4
Triglycerides: 78 mg/dL (ref 0.0–149.0)
VLDL: 15.6 mg/dL (ref 0.0–40.0)

## 2023-08-12 LAB — TSH: TSH: 1.06 u[IU]/mL (ref 0.35–5.50)

## 2023-08-12 LAB — COMPREHENSIVE METABOLIC PANEL
ALT: 11 U/L (ref 0–35)
AST: 19 U/L (ref 0–37)
Albumin: 3.9 g/dL (ref 3.5–5.2)
Alkaline Phosphatase: 57 U/L (ref 39–117)
BUN: 17 mg/dL (ref 6–23)
CO2: 27 meq/L (ref 19–32)
Calcium: 8.9 mg/dL (ref 8.4–10.5)
Chloride: 104 meq/L (ref 96–112)
Creatinine, Ser: 1 mg/dL (ref 0.40–1.20)
GFR: 53.47 mL/min — ABNORMAL LOW (ref >60.00)
Glucose, Bld: 98 mg/dL (ref 70–99)
Potassium: 3.9 meq/L (ref 3.5–5.1)
Sodium: 138 meq/L (ref 135–145)
Total Bilirubin: 0.7 mg/dL (ref 0.2–1.2)
Total Protein: 6.5 g/dL (ref 6.0–8.3)

## 2023-08-12 LAB — CBC
HCT: 39.1 % (ref 36.0–46.0)
Hemoglobin: 12.4 g/dL (ref 12.0–15.0)
MCHC: 31.9 g/dL (ref 30.0–36.0)
MCV: 90.5 fL (ref 78.0–100.0)
Platelets: 271 10*3/uL (ref 150.0–400.0)
RBC: 4.32 Mil/uL (ref 3.87–5.11)
RDW: 14.9 % (ref 11.5–15.5)
WBC: 6.5 10*3/uL (ref 4.0–10.5)

## 2023-08-12 LAB — HEMOGLOBIN A1C: Hgb A1c MFr Bld: 5.7 % (ref 4.6–6.5)

## 2023-08-12 LAB — VITAMIN D 25 HYDROXY (VIT D DEFICIENCY, FRACTURES): VITD: 31.47 ng/mL (ref 30.00–100.00)

## 2023-08-12 MED ORDER — FEXOFENADINE HCL 180 MG PO TABS: 180.0000 mg | ORAL_TABLET | Freq: Every day | ORAL | 3 refills | Status: DC

## 2023-08-12 NOTE — Patient Instructions (Addendum)
Return in about 1 year (around 08/12/2024) for cpe (20 min), Routine chronic condition follow-up.        Great to see you today.  I have refilled the medication(s) we provide.   If labs were collected or images ordered, we will inform you of  results once we have received them and reviewed. We will contact you either by echart message, or telephone call.  Please give ample time to the testing facility, and our office to run,  receive and review results. Please do not call inquiring of results, even if you can see them in your chart. We will contact you as soon as we are able. If it has been over 1 week since the test was completed, and you have not yet heard from [place], then please call us.    - echart message- for normal results that have been seen by the patient already.   - telephone call: abnormal results or if patient has not viewed results in their echart.  If a referral to a specialist was entered for you, please call us in 2 weeks if you have not heard from the specialist office to schedule.

## 2023-08-12 NOTE — Progress Notes (Unsigned)
Margaret Holmes , 23-Oct-1941, 82 y.o., female MRN: [id number] Patient Care Team    Relationship Specialty Notifications Start End  Natalia Leatherwood, DO PCP - General Family Medicine  04/09/16   Sharrell Ku, MD Consulting Physician Gastroenterology  05/23/16   Romualdo Bolk, MD (Inactive) Consulting Physician Obstetrics and Gynecology  05/23/16   Theodosia Blender, MD Referring Physician Urology  08/27/16   [facility]  09/01/18     Chief Complaint  Patient presents with   Annual Exam    Pt is fasting     Subjective:Margaret Holmes is a 82 y.o. female  Pt presents for an annual physical and combined OV for Chronic Conditions/illness Management  Health maintenance:  Colonoscopy: Tubular adenoma 09/2020-digestive health specialist [place], Dr. Yevonne Pax. F/u ? Mammogram:last 2019- pt declines further screening mammograms Immunizations: Tdap UTD 09/2022, influenza UTD 05/2023, pneumonia series completed, Shingrix series completed Infectious disease screening: Hepatitis C screening completed DEXA: Completed 2017-normal at that time.  Pt declined future bone density tests  Hypothyroid: She reports she has been taking her levothyroxine 75 mcg daily and empty stomach.  Her last TSH was under supplemented and dose was increased.  Patient reports she is feeling well and no side effects from increased dose.  She has been able to gain a little weight back.      08/12/2023    7:50 AM 03/01/2023   10:58 AM 10/17/2022    1:03 PM 05/23/2022    2:42 PM 01/08/2022   10:06 AM  Depression screen PHQ 2/9  Decreased Interest 0 0 0 0 0  Down, Depressed, Hopeless 0 0 0 0 0  PHQ - 2 Score 0 0 0 0 0    Allergies  Allergen Reactions   Diazepam Other (See Comments)    Psychiatric reaction Caused forgetfulness   Codeine Other (See Comments)    Not specified in old records.   Darvocet [Propoxyphene N-Acetaminophen] Other (See Comments)    Not  specified in old records   Hydrocodone Other (See Comments)    Not specified in old records   Social History   Social History Narrative   Married, 3 children, 6 grandchildren, 1 GGc.   Orig from this region.  Drives a school bus (X 31 yrs).   No T/A/Ds.   Caffeine: one cup coffee a day, 3 glasses of sweet tea per day.   No formal exercise.  She is active (NOT sedentary).            Past Medical History:  Diagnosis Date   Adnexal cyst 07/2013   Left-3cm-noted on CT abd/pelv--f/u pelvic u/s did not show anything (L ovary not visualized, likely secondary to bowel gas)-repeat pelvic u/s after 07/2014.   Anemia    Asthma    DDD (degenerative disc disease), cervical    H/O angular cheilitis    Zinc normal, vit B12 low normal (2012)   H/O vitamin D deficiency 2011   Came back to normal range with replacement therapydd   History of adenomatous polyp of colon 2003, 2006, 2013;2016   Repeat 2013 showed tubular adenoma x 3, with no high grade dysplasia.  2016 no polyps-recall 5 yrs   History of hiatal hernia    Hyperlipidemia    per pt report 05/09/12; also on labs 06/2015.  Intolerant of pravachol 02/2016   Hypothyroidism    Kidney infection    Lower leg pain    Bilateral, crampy--ABIs/dopplers NORMAL  04/2013.   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  +internal hem.  Normal ileoscopy.   DILATATION & CURETTAGE/HYSTEROSCOPY WITH MYOSURE N/A 05/28/2016   Procedure: DILATATION & CURETTAGE/HYSTEROSCOPY WITH MYOSURE with ULTRASOUND guidance;  Surgeon: Romualdo Bolk, MD;   Location: [facility];  Service: Gynecology;  Laterality: N/A;  Please have ultrasound in room. Follow Silva's first case.   GANGLION CYST EXCISION  2003   Right wrist, with excision of some triquetrum spurs   stent placed right kidney     THYROIDECTOMY, PARTIAL  1972   TONSILLECTOMY AND ADENOIDECTOMY  age 68   TUBAL LIGATION     WRIST SURGERY  2006   For left thumb carpo-metacarpal arthritis   Family History  Problem Relation Age of Onset   Cancer Mother        ovarian and throat cancer   Diabetes Mother    Alcohol abuse Father        alcoholism.  Died age 75   Cancer Maternal Grandmother        breast cancer   Allergies as of 08/12/2023       Reactions   Diazepam Other (See Comments)   Psychiatric reaction Caused forgetfulness   Codeine Other (See Comments)   Not specified in old records.   Darvocet [propoxyphene N-acetaminophen] Other (See Comments)   Not specified in old records   Hydrocodone Other (See Comments)   Not specified in old records        Medication List        Accurate as of August 12, 2023  8:03 AM. If you have any questions, ask your nurse or doctor.          STOP taking these medications    amoxicillin-clavulanate 875-125 MG tablet Commonly known as: AUGMENTIN Stopped by: Felix Pacini   naproxen 500 MG tablet Commonly known as: Naprosyn Stopped by: Felix Pacini       TAKE these medications    cyanocobalamin 1000 MCG tablet Commonly known as: VITAMIN B12 Take 1 tablet (1,000 mcg total) by mouth daily.   diclofenac sodium 1 % Gel Commonly known as: VOLTAREN Apply over affected area QID RPN   fexofenadine 180 MG tablet Commonly known as: ALLEGRA TAKE 1 TABLET BY MOUTH DAILY   fluticasone 50 MCG/ACT nasal spray Commonly known as: FLONASE Place into the nose.   levothyroxine 75 MCG tablet Commonly known as: Synthroid Take 1 tablet (75 mcg total) by mouth daily before breakfast.   VITAMIN C-VITAMIN D-ZINC PO Take by mouth  daily.        All past medical history, surgical history, allergies, family history, immunizations andmedications were updated in the EMR today and reviewed under the history and medication portions of their EMR.     ROS Negative, with the exception of above mentioned in HPI   Objective:  BP 112/72   Pulse 62   Temp 97.8 F (36.6 C)   Ht 5\' 2"  (1.575 m)   Wt 110 lb 6.4 oz (50.1 kg)   SpO2 93%   BMI 20.19  kg/m  Body mass index is 20.19 kg/m. Physical Exam Vitals and nursing note reviewed.  Constitutional:      General: She is not in acute distress.    Appearance: She is not ill-appearing, toxic-appearing or diaphoretic.     Comments: Thin.  HENT:     Head: Normocephalic and atraumatic.     Right Ear: Tympanic membrane and ear canal normal.     Left Ear: Tympanic membrane and ear canal normal.     Nose: No congestion or rhinorrhea.  Eyes:     General: No scleral icterus.       Right eye: No discharge.        Left eye: No discharge.     Extraocular Movements: Extraocular movements intact.     Conjunctiva/sclera: Conjunctivae normal.     Pupils: Pupils are equal, round, and reactive to light.  Cardiovascular:     Rate and Rhythm: Normal rate and regular rhythm.     Heart sounds: No murmur heard. Pulmonary:     Effort: Pulmonary effort is normal. No respiratory distress.     Breath sounds: Normal breath sounds. No wheezing, rhonchi or rales.  Musculoskeletal:     Cervical back: Neck supple.     Right lower leg: No edema.     Left lower leg: No edema.  Skin:    General: Skin is warm and dry.     Coloration: Skin is not jaundiced or pale.     Findings: No erythema or rash.  Neurological:     Mental Status: She is alert and oriented to person, place, and time. Mental status is at baseline.     Motor: No weakness.     Gait: Gait normal.  Psychiatric:        Mood and Affect: Mood normal.        Behavior: Behavior normal.        Thought Content: Thought content  normal.        Judgment: Judgment normal.      No results found. No results found. No results found for this or any previous visit (from the past 24 hour(s)).  Assessment/Plan: IKIA CINCOTTA is a 82 y.o. female present for OV for  CPE and Chronic Conditions/illness Management  Hypothyroidism Continue levothyroxine to 75 mcg daily for now.refills will be provided in appropriate dose based on lab result today TSH and T4 free collected today Mixed hyperlipidemia Diet controlled - Comp Met (CMET) - Lipid panel - CBC Vitamin D deficiency - Vitamin D (25 hydroxy) Encounter for long-term current use of medication - Hemoglobin A1c  Allergies: Patient would like to have allergy medicine prescribed to see if insurance will help pay. Allegra 180 daily prescribed  1. Mixed hyperlipidemia *** - Comprehensive metabolic panel - Hemoglobin A1c - TSH - Lipid panel - CBC  2. Acquired hypothyroidism *** - Hemoglobin A1c - TSH  3. Vitamin D deficiency *** - VITAMIN D 25 Hydroxy (Vit-D Deficiency, Fractures)  4. Iron deficiency ***  5. Stage 3a chronic kidney disease (HCC) - Hemoglobin A1c  Diabetes mellitus screening - Hemoglobin A1c  7. Routine general medical examination at a health care facility *** - Hemoglobin A1c  8. Breast cancer screening by mammogram ***  Patient was encouraged to exercise greater than 150 minutes a week. Patient was encouraged to choose a diet filled with fresh fruits and vegetables, and lean meats. AVS provided to patient today for education/recommendation on gender specific health and safety maintenance.  No follow-ups  on file.   Reviewed expectations re: course of current medical issues. Discussed self-management of symptoms. Outlined signs and symptoms indicating need for more acute intervention. Patient verbalized understanding and all questions were answered. Patient received an After-Visit Summary.    Orders Placed This  Encounter  Procedures   Comprehensive metabolic panel   Hemoglobin A1c   TSH   Lipid panel   VITAMIN D 25 Hydroxy (Vit-D Deficiency, Fractures)   CBC   No orders of the defined types were placed in this encounter.  Referral Orders  No referral(s) requested today     Note is dictated utilizing voice recognition software. Although note has been proof read prior to signing, occasional typographical errors still can be missed. If any questions arise, please do not hesitate to call for verification.   electronically signed by:  Felix Pacini, DO  [facility] - [place]

## 2023-08-13 ENCOUNTER — Telehealth: Payer: Self-pay | Admitting: Family Medicine

## 2023-08-13 MED ORDER — LEVOTHYROXINE SODIUM 75 MCG PO TABS: 75.0000 ug | ORAL_TABLET | Freq: Every day | ORAL | 3 refills | Status: DC

## 2023-08-13 NOTE — Telephone Encounter (Signed)
Pt made aware of results.

## 2023-08-13 NOTE — Telephone Encounter (Signed)
Please call patient Liver, kidney and thyroid function are normal.  I have refilled her thyroid medication for her, dose remain the same. Blood cell counts and electrolytes are normal Diabetes screening/A1c is normal at 5.7 Cholesterol panel looks good and is at goal for her. Vitamin D levels are normal.  Everything looks good.

## 2024-02-20 DIAGNOSIS — H1013 Acute atopic conjunctivitis, bilateral: Secondary | ICD-10-CM | POA: Diagnosis not present

## 2024-04-21 ENCOUNTER — Ambulatory Visit: Admitting: Family Medicine

## 2024-04-21 ENCOUNTER — Encounter: Payer: Self-pay | Admitting: Family Medicine

## 2024-04-21 VITALS — BP 110/70 | HR 67 | Temp 97.9°F | Wt 106.8 lb

## 2024-04-21 DIAGNOSIS — E063 Autoimmune thyroiditis: Secondary | ICD-10-CM | POA: Diagnosis not present

## 2024-04-21 DIAGNOSIS — J029 Acute pharyngitis, unspecified: Secondary | ICD-10-CM

## 2024-04-21 DIAGNOSIS — R42 Dizziness and giddiness: Secondary | ICD-10-CM

## 2024-04-21 DIAGNOSIS — H5789 Other specified disorders of eye and adnexa: Secondary | ICD-10-CM | POA: Diagnosis not present

## 2024-04-21 MED ORDER — CEFDINIR 300 MG PO CAPS: 300.0000 mg | ORAL_CAPSULE | Freq: Two times a day (BID) | ORAL | 0 refills | Status: DC

## 2024-04-21 MED ORDER — FLUTICASONE PROPIONATE 50 MCG/ACT NA SUSP: 1.0000 | Freq: Every day | NASAL | 11 refills | Status: AC

## 2024-04-21 MED ORDER — CETIRIZINE HCL 10 MG PO TABS: 10.0000 mg | ORAL_TABLET | Freq: Every day | ORAL | 11 refills | Status: DC

## 2024-04-21 MED ORDER — OLOPATADINE HCL 0.2 % OP SOLN: 1.0000 [drp] | Freq: Every day | OPHTHALMIC | 11 refills | Status: AC

## 2024-04-21 NOTE — Progress Notes (Signed)
 Margaret Holmes , 11/03/1940, 83 y.o., female MRN: [id number] Patient Care Team    Relationship Specialty Notifications Start End  Catherine Charlies LABOR, DO PCP - General Family Medicine  04/09/16   Luis Purchase, MD Consulting Physician Gastroenterology  05/23/16   Jannis Kate Norris, MD Consulting Physician Obstetrics and Gynecology  05/23/16   Tanda Norleen HERO, MD Referring Physician Urology  08/27/16   [facility]  09/01/18     Chief Complaint  Patient presents with   Dizziness    SOB started about 1 week ago; noticed cold fingers (with color change) after shopping since december   Sore Throat    Right side starting yesterday; 4-5 weeks of eye drainage; advised by opthalmologist that she has allergies; decreased apetite     Subjective: Margaret Holmes is a 83 y.o. Pt presents for an OV to discuss her thyroid  concerns. She reports she has been experiencing right sided sore throat that started yesterday.  She reports bilateral eye drainage that occurred at the end of April in which she was given eyedrops by the ophthalmologist.  She reports her eyes have felt very irritated since.  She has had a decreased appetite, dizziness, feeling winded, sinus pressure.  She had dizziness 1 week ago.   She states 1-1/2 months ago she had a right sided headache, that self resolved, and she has not felt the same since. She reports when she goes shopping and its cold at the grocery store, her fingers will turn bluish. She has a history of hypothyroid, and reports compliance with levothyroxine  75 mcg daily She states she still was not sleeping well.  She is averaging about 4-5 hours of sleep at night.     08/12/2023    7:50 AM 03/01/2023   10:58 AM 10/17/2022    1:03 PM 05/23/2022    2:42 PM 01/08/2022   10:06 AM  Depression screen PHQ 2/9  Decreased Interest 0 0 0 0 0  Down, Depressed, Hopeless 0 0 0 0 0  PHQ - 2 Score 0 0 0 0 0    Allergies  Allergen  Reactions   Diazepam Other (See Comments)    Psychiatric reaction Caused forgetfulness   Codeine Other (See Comments)    Not specified in old records.   Darvocet [Propoxyphene N-Acetaminophen] Other (See Comments)    Not specified in old records   Hydrocodone Other (See Comments)    Not specified in old records   Social History   Social History Narrative   Married, 3 children, 6 grandchildren, 1 GGc.   Orig from this region.  Drives a school bus (X 31 yrs).   No T/A/Ds.   Caffeine: one cup coffee a day, 3 glasses of sweet tea per day.   No formal exercise.  She is active (NOT sedentary).            Past Medical History:  Diagnosis Date   Adnexal cyst 07/2013   Left-3cm-noted on CT abd/pelv--f/u pelvic u/s did not show anything (L ovary not visualized, likely secondary to bowel gas)-repeat pelvic u/s after 07/2014.   Anemia    Asthma    DDD (degenerative disc disease), cervical    H/O angular cheilitis    Zinc normal, vit B12 low normal (2012)   H/O vitamin D  deficiency 2011   Came back to normal range with replacement therapydd   History of adenomatous polyp of colon 2003, 2006, 2013;2016  Repeat 2013 showed tubular adenoma x 3, with no high grade dysplasia.  2016 no polyps-recall 5 yrs   History of hiatal hernia    Hyperlipidemia    per pt report 05/09/12; also on labs 06/2015.  Intolerant of pravachol  02/2016   Hypothyroidism    Kidney infection    Lower leg pain    Bilateral, crampy--ABIs/dopplers NORMAL 04/2013.   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   +internal hem.  Normal ileoscopy.   DILATATION & CURETTAGE/HYSTEROSCOPY WITH MYOSURE N/A 05/28/2016   Procedure: DILATATION & CURETTAGE/HYSTEROSCOPY WITH MYOSURE with ULTRASOUND guidance;  Surgeon: Kate Hargis Nearing, MD;  Location: [facility];  Service: Gynecology;  Laterality: N/A;  Please have ultrasound in room. Follow Silva's first case.   GANGLION CYST EXCISION  2003   Right wrist, with excision of some triquetrum spurs   stent placed right kidney     THYROIDECTOMY, PARTIAL  1972   TONSILLECTOMY AND ADENOIDECTOMY  age 87   TUBAL LIGATION     WRIST SURGERY  2006   For left thumb carpo-metacarpal arthritis   Family History  Problem Relation Age of Onset   Cancer Mother        ovarian and throat cancer   Diabetes Mother    Alcohol abuse Father        alcoholism.  Died age 36   Cancer Maternal Grandmother        breast cancer   Allergies as of 04/21/2024       Reactions   Diazepam Other (See Comments)   Psychiatric reaction Caused forgetfulness   Codeine Other (See Comments)   Not specified in old records.   Darvocet [propoxyphene N-acetaminophen] Other (See Comments)   Not specified in old records   Hydrocodone Other (See Comments)   Not specified in old records        Medication List        Accurate as of April 21, 2024  3:06 PM. If you have any questions, ask your nurse or doctor.          cefdinir  300 MG capsule Commonly known as: OMNICEF  Take 1 capsule (300 mg total) by mouth 2 (two) times daily. Started by: Charlies Bellini   cetirizine 10 MG tablet Commonly known as: ZYRTEC Take 1 tablet (10 mg total) by mouth at bedtime. Started by: Ruby Dilone   cyanocobalamin  1000 MCG tablet Commonly known as: VITAMIN B12 Take 1 tablet (1,000 mcg total) by mouth daily.   diclofenac  sodium 1 % Gel Commonly known as: VOLTAREN  Apply over affected area QID RPN   fexofenadine  180 MG tablet Commonly known as: ALLEGRA  Take 1 tablet (180 mg total) by mouth daily.    fluticasone  50 MCG/ACT nasal spray Commonly known as: FLONASE  Place 1 spray into both nostrils daily. What changed:  how much to take how to take this when to take this Changed by: Gal Feldhaus   levothyroxine  75 MCG tablet Commonly known as: Synthroid  Take 1 tablet (75 mcg total) by mouth daily before breakfast.   Olopatadine HCl 0.2 %  Soln Apply 1 drop to eye daily. Each eye Started by: Mavric Cortright   VITAMIN C-VITAMIN D -ZINC PO Take by mouth daily.        All past medical history, surgical history, allergies, family history, immunizations andmedications were updated in the EMR today and reviewed under the history and medication portions of their EMR.     ROS Negative, with the exception of above mentioned in HPI   Objective:  BP 110/70   Pulse 67   Temp 97.9 F (36.6 C)   Wt 106 lb 12.8 oz (48.4 kg)   SpO2 97%   BMI 19.53 kg/m  Body mass index is 19.53 kg/m.  Physical Exam Vitals and nursing note reviewed.  Constitutional:      General: She is not in acute distress.    Appearance: Normal appearance. She is not ill-appearing, toxic-appearing or diaphoretic.  HENT:     Head: Normocephalic and atraumatic.     Right Ear: Tympanic membrane, ear canal and external ear normal. There is no impacted cerumen.     Left Ear: Tympanic membrane, ear canal and external ear normal. There is no impacted cerumen.     Nose: Rhinorrhea present. No congestion.     Right Sinus: Maxillary sinus tenderness present.     Left Sinus: Maxillary sinus tenderness present.     Comments: Bilateral erythema of nostrils    Mouth/Throat:     Mouth: Mucous membranes are moist.     Dentition: Abnormal dentition. Gingival swelling and dental caries present. No dental abscesses.     Pharynx: Postnasal drip present. No oropharyngeal exudate or posterior oropharyngeal erythema.     Comments: PND present  Eyes:     General: No scleral icterus.       Right eye: No discharge.        Left  eye: No discharge.     Extraocular Movements: Extraocular movements intact.     Conjunctiva/sclera: Conjunctivae normal.     Pupils: Pupils are equal, round, and reactive to light.   Neck:     Comments: No thyromegaly Cardiovascular:     Rate and Rhythm: Normal rate and regular rhythm.     Heart sounds: No murmur heard. Pulmonary:     Effort: Pulmonary effort is normal. No respiratory distress.     Breath sounds: Rhonchi present. No wheezing or rales.   Musculoskeletal:     Cervical back: Normal range of motion and neck supple. Tenderness present.     Right lower leg: No edema.     Left lower leg: No edema.  Lymphadenopathy:     Cervical: Cervical adenopathy present.   Skin:    General: Skin is warm.     Findings: No rash.   Neurological:     Mental Status: She is alert and oriented to person, place, and time. Mental status is at baseline.     Motor: No weakness.     Gait: Gait normal.   Psychiatric:        Mood and Affect: Mood normal.        Behavior: Behavior normal.        Thought Content: Thought content normal.        Judgment: Judgment normal.      No results found. No results found. No results found for this or any previous visit (from the past 24 hours).  Assessment/Plan: CONCETTINA LETH is a 83 y.o. female present for OV for  Hypothyroidism due to Hashimoto thyroiditis (Primary) Patient contributes  symptoms to possible thyroid  Checked today. Continue levothyroxine  75 mcg daily. - TSH  Sore throat/sinusitis/dizziness - CBC w/Diff - Comp Met (CMET) Sore throat possibly related to postnasal drip from her bacterial sinusitis.   Her dizziness possibly from sinusitis Poor dentition with dental caries present. Omnicef  twice daily prescribed Pataday, Zyrtec, Flonase  prescribed  Eye irritation Eyes appears allergic conjunctivitis.  No drainage appreciated. Pataday drops prescribed Zyrtec nightly Flonase  twice daily  Dizziness Uncertain etiology,  possibly multifactorial.  Will get electrolyte disturbances, will treat sinusitis and check thyroid  as potential causes. - CBC w/Diff - Comp Met (CMET)  Reviewed expectations re: course of current medical issues. Discussed self-management of symptoms. Outlined signs and symptoms indicating need for more acute intervention. Patient verbalized understanding and all questions were answered. Patient received an After-Visit Summary.  Follow-up in 2-4 weeks if symptoms do not continue to improve, sooner if worsening.  Orders Placed This Encounter  Procedures   TSH   CBC w/Diff   Comp Met (CMET)   Meds ordered this encounter  Medications   fluticasone  (FLONASE ) 50 MCG/ACT nasal spray    Sig: Place 1 spray into both nostrils daily.    Dispense:  16 g    Refill:  11   cetirizine (ZYRTEC) 10 MG tablet    Sig: Take 1 tablet (10 mg total) by mouth at bedtime.    Dispense:  30 tablet    Refill:  11   Olopatadine HCl 0.2 % SOLN    Sig: Apply 1 drop to eye daily. Each eye    Dispense:  2.5 mL    Refill:  11   cefdinir  (OMNICEF ) 300 MG capsule    Sig: Take 1 capsule (300 mg total) by mouth 2 (two) times daily.    Dispense:  20 capsule    Refill:  0   Referral Orders  No referral(s) requested today     Note is dictated utilizing voice recognition software. Although note has been proof read prior to signing, occasional typographical errors still can be missed. If any questions arise, please do not hesitate to call for verification.   electronically signed by:  Charlies Bellini, DO  [facility] [place]

## 2024-04-21 NOTE — Patient Instructions (Signed)

## 2024-04-22 ENCOUNTER — Ambulatory Visit: Payer: Self-pay | Admitting: Family Medicine

## 2024-04-22 LAB — COMPREHENSIVE METABOLIC PANEL WITH GFR
AG Ratio: 1.5 (calc) (ref 1.0–2.5)
ALT: 9 U/L (ref 6–29)
AST: 18 U/L (ref 10–35)
Albumin: 4.1 g/dL (ref 3.6–5.1)
Alkaline phosphatase (APISO): 63 U/L (ref 37–153)
BUN/Creatinine Ratio: 14 (calc) (ref 6–22)
BUN: 14 mg/dL (ref 7–25)
CO2: 28 mmol/L (ref 20–32)
Calcium: 9.1 mg/dL (ref 8.6–10.4)
Chloride: 102 mmol/L (ref 98–110)
Creat: 0.99 mg/dL — ABNORMAL HIGH (ref 0.60–0.95)
Globulin: 2.8 g/dL (ref 1.9–3.7)
Glucose, Bld: 91 mg/dL (ref 65–99)
Potassium: 4.7 mmol/L (ref 3.5–5.3)
Sodium: 137 mmol/L (ref 135–146)
Total Bilirubin: 0.7 mg/dL (ref 0.2–1.2)
Total Protein: 6.9 g/dL (ref 6.1–8.1)
eGFR: 58 mL/min/{1.73_m2} — ABNORMAL LOW (ref >=60)

## 2024-04-22 LAB — CBC WITH DIFFERENTIAL/PLATELET
Absolute Lymphocytes: 2208 {cells}/uL (ref 850–3900)
Absolute Monocytes: 808 {cells}/uL (ref 200–950)
Basophils Absolute: 48 {cells}/uL (ref 0–200)
Basophils Relative: 0.6 %
Eosinophils Absolute: 160 {cells}/uL (ref 15–500)
Eosinophils Relative: 2 %
HCT: 39.4 % (ref 35.0–45.0)
Hemoglobin: 12.5 g/dL (ref 11.7–15.5)
MCH: 29.8 pg (ref 27.0–33.0)
MCHC: 31.7 g/dL — ABNORMAL LOW (ref 32.0–36.0)
MCV: 93.8 fL (ref 80.0–100.0)
MPV: 10.2 fL (ref 7.5–12.5)
Monocytes Relative: 10.1 %
Neutro Abs: 4776 {cells}/uL (ref 1500–7800)
Neutrophils Relative %: 59.7 %
Platelets: 258 10*3/uL (ref 140–400)
RBC: 4.2 10*6/uL (ref 3.80–5.10)
RDW: 14.5 % (ref 11.0–15.0)
Total Lymphocyte: 27.6 %
WBC: 8 10*3/uL (ref 3.8–10.8)

## 2024-04-22 LAB — TSH: TSH: 0.35 m[IU]/L — ABNORMAL LOW (ref 0.40–4.50)

## 2024-04-22 MED ORDER — LEVOTHYROXINE SODIUM 75 MCG PO TABS: ORAL_TABLET | ORAL | 3 refills | Status: DC

## 2024-04-22 NOTE — Telephone Encounter (Signed)
 Please call patient Liver and kidney function are normal Blood cell counts are normal Thyroid  is mildly oversupplemented.  I have called in refills on her thyroid  medication.  She will take 1 tab daily for 6 days a week, and half a tablet on Sunday.  This change will be enough to get her back into normal range.

## 2024-07-09 ENCOUNTER — Ambulatory Visit: Payer: Self-pay

## 2024-07-09 NOTE — Telephone Encounter (Signed)
 FYI Only or Action Required?: Action required by provider: update on patient condition.  Patient was last seen in primary care on 04/21/2024 by Catherine Fuller A, DO.  Called Nurse Triage reporting Circulatory Problem.  Symptoms began several months ago.  Interventions attempted: Nothing.  Symptoms are: unchanged.  Triage Disposition: Call PCP Within 24 Hours  Patient/caregiver understands and will follow disposition?: Unsure      Copied from CRM #[id number]. Topic: Clinical - Red Word Triage >> Jul 09, 2024 11:33 AM Margaret Holmes wrote: Reason for Triage: Patient stated that her fingers turn blue from being cold and she is having soreness in both of her legs. Patient stated is always cold and has chills. Reason for Disposition  [1] Bluish (or gray) lips or face AND [2] chronic (long-standing, ongoing) AND [3] SAME as usual (patient's baseline)  Answer Assessment - Initial Assessment Questions 1. SYMPTOMS: What symptoms are you concerned about?     Fingers turning purple/blue from cold 2. ONSET:  When did the symptoms start?     X 1 year 3. TEMPERATURE: What is the room or outdoor temperature? How was it measured?      unknown 4. COLD EXPOSURE: Was there an exposure to cold temperatures? (Holmes.g., outside in the snow, swimming in cold water, air conditioning)     Yes, reports most episodes at the grocery store - endorses self-resolves when she gets warm 5. OTHER SYMPTOMS: Are there any other symptoms? (Holmes.g., confusion, fever, numbness of fingers or toes, weakness)     Endorses sneezing 6. PREGNANCY: Is there any chance you are pregnant? When was your last menstrual period?     N/a  Answer Assessment - Initial Assessment Questions 1.  MAIN CONCERN OR SYMPTOM: What is your main concern right now? Describe how the skin looks? (Holmes.g., ashen, blue, gray, purple; lacy or mottled pattern)     Fingers purple/blue 2. LOCATION: What part or parts of the body? (Holmes.g., chest,  face, lips, inside mouth, hands, feet, just one side of body or both). If only hand(s) or foot(feet), ask: Was there any recent exposure to cold?       Fingers 3. ONSET: When did this start? (Holmes.g., minutes, hours, days; chronic problem)     Chronic problem 4. PATTERN: Does this come and go or is it constant? (Holmes.g., intermittent or constant; getting better or worse; only happens when fingers get cold)     Comes and goes with coldness, esp at grocery store 5. OTHER SYMPTOMS: Do you have any other symptoms? (Holmes.g., breathing problems, chest pain, confusion, cough, fever, feeling dizzy, pain, weakness)     See alternate assessment 6. PAST MEDICAL HISTORY: Have you had this before? Is this a long-term (ongoing or recurring) problem? (Holmes.g.,  COPD, heart failure, Raynaud's Phenomenon)      yes 7. PREGNANCY: Is there any chance you are pregnant? When was your last menstrual period?     N/a    Triager attempted to schedule with PCP but pt stated she was going to UC for convenience.  Answer Assessment - Initial Assessment Questions 1. ONSET: When did the pain start?      Last time she saw PCP 2. LOCATION: Where is the pain located?      *No Answer* 3. PAIN: How bad is the pain?    (Scale 1-10; or mild, moderate, severe)     *No Answer* 4. WORK OR EXERCISE: Has there been any recent work or exercise that involved this part of the  body?      *No Answer* 5. CAUSE: What do you think is causing the leg pain?     *No Answer* 6. OTHER SYMPTOMS: Do you have any other symptoms? (Holmes.g., chest pain, back pain, breathing difficulty, swelling, rash, fever, numbness, weakness)     *No Answer* 7. PREGNANCY: Is there any chance you are pregnant? When was your last menstrual period?     *No Answer*  Protocols used: Leg Pain-A-AH, Cold Exposure (Hypothermia)-A-AH, Bluish Skin or Body Part (Cyanosis)-A-AH

## 2024-07-30 ENCOUNTER — Other Ambulatory Visit: Payer: Self-pay

## 2024-07-30 ENCOUNTER — Ambulatory Visit
Admission: EM | Admit: 2024-07-30 | Discharge: 2024-07-30 | Disposition: A | Discharge status: Home / Self Care | Attending: Family Medicine | Admitting: Family Medicine

## 2024-07-30 DIAGNOSIS — S40812A Abrasion of left upper arm, initial encounter: Secondary | ICD-10-CM | POA: Diagnosis not present

## 2024-07-30 MED ORDER — CETIRIZINE HCL 10 MG PO TABS: 10.0000 mg | ORAL_TABLET | Freq: Two times a day (BID) | ORAL | 0 refills | Status: DC

## 2024-07-30 MED ORDER — TRIAMCINOLONE ACETONIDE 0.1 % EX CREA: 1.0000 | TOPICAL_CREAM | Freq: Two times a day (BID) | CUTANEOUS | 0 refills | Status: AC

## 2024-07-30 NOTE — ED Triage Notes (Addendum)
 Reports left arm insect bites. Was mowing and hit arm because she thoughts it was ants. There is an area of redness to left upper arm. This occurred 3 weeks ago. Has been using green alcohol, neosporin, cortisone, finger nail polish.

## 2024-07-30 NOTE — Discharge Instructions (Signed)
 Take Zyrtec  2 times a day for 2 weeks Use the cream on your rash 2 times a day for 2 weeks See your doctor if it fails to improve

## 2024-07-30 NOTE — ED Provider Notes (Signed)
 [facility]    CSN: [id number] Arrival date & time: 07/30/24  1006      History   Chief Complaint Chief Complaint  Patient presents with   Insect Bite    HPI Margaret Holmes is a 83 y.o. female.   HPI  Patient feels she got unknown insect bites on her left upper arm about 2 weeks ago.  They have been itching ever since then.  She states itching is intense at times.  She states I am scratching my skin off.  She is not taking any antihistamines.  She is not using any specific lotion.  She has been wiping the area with alcohol and putting nail polish over the open areas.   Past Medical History:  Diagnosis Date   Adnexal cyst 07/2013   Left-3cm-noted on CT abd/pelv--f/u pelvic u/s did not show anything (L ovary not visualized, likely secondary to bowel gas)-repeat pelvic u/s after 07/2014.   Anemia    Asthma    DDD (degenerative disc disease), cervical    H/O angular cheilitis    Zinc normal, vit B12 low normal (2012)   H/O vitamin D  deficiency 2011   Came back to normal range with replacement therapydd   History of adenomatous polyp of colon 2003, 2006, 2013;2016   Repeat 2013 showed tubular adenoma x 3, with no high grade dysplasia.  2016 no polyps-recall 5 yrs   History of hiatal hernia    Hyperlipidemia    per pt report 05/09/12; also on labs 06/2015.  Intolerant of pravachol  02/2016   Hypothyroidism    Kidney infection    Lower leg pain    Bilateral, crampy--ABIs/dopplers NORMAL 04/2013.   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   and left elbow (ortho referral 11/2014)   Paresthesia 06/15/2020   PONV (postoperative nausea and vomiting)    jerks afterward   Shortness of breath dyspnea     Patient Active Problem List   Diagnosis Date Noted   Stage 3a chronic kidney disease (HCC) 12/21/2022   Iron deficiency 06/15/2020   Ovarian cyst 04/17/2019   Encounter for Medicare annual wellness exam 09/01/2018   Vitamin D  deficiency 08/28/2016   Anemia     Hyperlipidemia 02/07/2016   Hypothyroidism 05/11/2012    Past Surgical History:  Procedure Laterality Date   BAND HEMORRHOIDECTOMY     CATARACT EXTRACTION W/ INTRAOCULAR LENS IMPLANT Left 10/27/2019   CHOLECYSTECTOMY  1980s   COLONOSCOPY W/ BIOPSIES AND POLYPECTOMY  06/02/12;05/30/15   No polyps 2016-recall 5 yrs.  +internal hem.  Normal ileoscopy.   DILATATION & CURETTAGE/HYSTEROSCOPY WITH MYOSURE N/A 05/28/2016   Procedure: DILATATION & CURETTAGE/HYSTEROSCOPY WITH MYOSURE with ULTRASOUND guidance;  Surgeon: Kate Hargis Nearing, MD;  Location: [facility];  Service: Gynecology;  Laterality: N/A;  Please have ultrasound in room. Follow Silva's first case.   GANGLION CYST EXCISION  2003   Right wrist, with excision of some triquetrum spurs   stent placed right kidney     THYROIDECTOMY, PARTIAL  1972   TONSILLECTOMY AND ADENOIDECTOMY  age 17   TUBAL LIGATION     WRIST SURGERY  2006   For left thumb carpo-metacarpal arthritis    OB History     Gravida  3   Para  3   Term  3   Preterm      AB      Living  3      SAB      IAB      Ectopic  Multiple      Live Births  3            Home Medications    Prior to Admission medications   Medication Sig Start Date End Date Taking? Authorizing Provider  cetirizine  (ZYRTEC ) 10 MG tablet Take 1 tablet (10 mg total) by mouth 2 (two) times daily. 07/30/24  Yes Maranda Jamee Jacob, MD  triamcinolone  cream (KENALOG ) 0.1 % Apply 1 Application topically 2 (two) times daily. 07/30/24  Yes Maranda Jamee Jacob, MD  diclofenac  sodium (VOLTAREN ) 1 % GEL Apply over affected area QID RPN 01/19/19   Kuneff, [name] A, DO  fluticasone  (FLONASE ) 50 MCG/ACT nasal spray Place 1 spray into both nostrils daily. 04/21/24   Kuneff, [name] A, DO  levothyroxine  (SYNTHROID ) 75 MCG tablet 1 tab p.o. on empty stomach 6 days a week, half tablet p.o. 1 day a week 04/22/24   Kuneff, [name] A, DO  Olopatadine  HCl 0.2 % SOLN Apply 1 drop to eye daily. Each eye  04/21/24   Catherine Fuller A, DO  vitamin B-12 (CYANOCOBALAMIN ) 1000 MCG tablet Take 1 tablet (1,000 mcg total) by mouth daily. 06/16/20   Kuneff, [name] A, DO  VITAMIN C-VITAMIN D -ZINC PO Take by mouth daily.    [provider]    Family History Family History  Problem Relation Age of Onset   Cancer Mother        ovarian and throat cancer   Diabetes Mother    Alcohol abuse Father        alcoholism.  Died age 13   Cancer Maternal Grandmother        breast cancer    Social History Social History   Tobacco Use   Smoking status: Never   Smokeless tobacco: Never  Vaping Use   Vaping status: Never Used  Substance Use Topics   Alcohol use: No    Alcohol/week: 0.0 standard drinks of alcohol   Drug use: No     Allergies   Diazepam, Codeine, Darvocet [propoxyphene n-acetaminophen], and Hydrocodone   Review of Systems Review of Systems See HPI  Physical Exam Triage Vital Signs ED Triage Vitals  Encounter Vitals Group     BP 07/30/24 1017 (!) 165/78     Girls Systolic BP Percentile --      Girls Diastolic BP Percentile --      Boys Systolic BP Percentile --      Boys Diastolic BP Percentile --      Pulse Rate 07/30/24 1017 (!) 59     Resp 07/30/24 1017 16     Temp 07/30/24 1017 97.7 F (36.5 C)     Temp src --      SpO2 07/30/24 1017 100 %     Weight --      Height --      Head Circumference --      Peak Flow --      Pain Score 07/30/24 1021 8     Pain Loc --      Pain Education --      Exclude from Growth Chart --    No data found.  Updated Vital Signs BP (!) 165/78   Pulse (!) 59   Temp 97.7 F (36.5 C)   Resp 16   SpO2 100%      Physical Exam Constitutional:      General: She is not in acute distress.    Appearance: She is well-developed. She is not ill-appearing.  HENT:  Head: Normocephalic and atraumatic.  Eyes:     Conjunctiva/sclera: Conjunctivae normal.     Pupils: Pupils are equal, round, and reactive to light.  Cardiovascular:      Rate and Rhythm: Normal rate.  Pulmonary:     Effort: Pulmonary effort is normal. No respiratory distress.  Abdominal:     General: There is no distension.     Palpations: Abdomen is soft.  Musculoskeletal:        General: Normal range of motion.     Cervical back: Normal range of motion.  Skin:    General: Skin is warm and dry.     Findings: Rash present.     Comments: Multiple small excoriations present from the anterior shoulder region down to about the elbow on the left arm.  Neurological:     Mental Status: She is alert.      UC Treatments / Results  Labs (all labs ordered are listed, but only abnormal results are displayed) Labs Reviewed - No data to display  EKG   Radiology No results found.  Procedures Procedures (including critical care time)  Medications Ordered in UC Medications - No data to display  Initial Impression / Assessment and Plan / UC Course  I have reviewed the triage vital signs and the nursing notes.  Pertinent labs & imaging results that were available during my care of the patient were reviewed by me and considered in my medical decision making (see chart for details).     I do not know what caused this rash or these bites but at this point she just has a series of excoriations that she is scratching open Final Clinical Impressions(s) / UC Diagnoses   Final diagnoses:  Excoriation of left upper arm, initial encounter     Discharge Instructions      Take Zyrtec  2 times a day for 2 weeks Use the cream on your rash 2 times a day for 2 weeks See your doctor if it fails to improve   ED Prescriptions     Medication Sig Dispense Auth. Provider   triamcinolone  cream (KENALOG ) 0.1 % Apply 1 Application topically 2 (two) times daily. 30 g Maranda Jamee Jacob, MD   cetirizine  (ZYRTEC ) 10 MG tablet Take 1 tablet (10 mg total) by mouth 2 (two) times daily. 30 tablet Maranda Jamee Jacob, MD      PDMP not reviewed this encounter.    Maranda Jamee Jacob, MD 07/30/24 [id number]

## 2024-08-17 ENCOUNTER — Encounter: Payer: Self-pay | Admitting: Family Medicine

## 2024-08-17 ENCOUNTER — Ambulatory Visit (INDEPENDENT_AMBULATORY_CARE_PROVIDER_SITE_OTHER): Admitting: Family Medicine

## 2024-08-17 VITALS — BP 110/74 | HR 62 | Temp 97.9°F | Ht 62.0 in | Wt 110.6 lb

## 2024-08-17 DIAGNOSIS — E063 Autoimmune thyroiditis: Secondary | ICD-10-CM | POA: Diagnosis not present

## 2024-08-17 DIAGNOSIS — E782 Mixed hyperlipidemia: Secondary | ICD-10-CM

## 2024-08-17 DIAGNOSIS — R350 Frequency of micturition: Secondary | ICD-10-CM

## 2024-08-17 DIAGNOSIS — Z131 Encounter for screening for diabetes mellitus: Secondary | ICD-10-CM

## 2024-08-17 DIAGNOSIS — Z23 Encounter for immunization: Secondary | ICD-10-CM

## 2024-08-17 DIAGNOSIS — E559 Vitamin D deficiency, unspecified: Secondary | ICD-10-CM | POA: Diagnosis not present

## 2024-08-17 DIAGNOSIS — N1831 Chronic kidney disease, stage 3a: Secondary | ICD-10-CM

## 2024-08-17 LAB — LIPID PANEL
Cholesterol: 216 mg/dL — ABNORMAL HIGH (ref 0–200)
HDL: 51.2 mg/dL (ref >39.00)
LDL Cholesterol: 145 mg/dL — ABNORMAL HIGH (ref 0–99)
NonHDL: 164.39
Total CHOL/HDL Ratio: 4
Triglycerides: 95 mg/dL (ref 0.0–149.0)
VLDL: 19 mg/dL (ref 0.0–40.0)

## 2024-08-17 LAB — T4, FREE: Free T4: 1.13 ng/dL (ref 0.60–1.60)

## 2024-08-17 LAB — HEMOGLOBIN A1C: Hgb A1c MFr Bld: 5.7 % (ref 4.6–6.5)

## 2024-08-17 LAB — VITAMIN D 25 HYDROXY (VIT D DEFICIENCY, FRACTURES): VITD: 31.73 ng/mL (ref 30.00–100.00)

## 2024-08-17 LAB — TSH: TSH: 1.81 u[IU]/mL (ref 0.35–5.50)

## 2024-08-17 MED ORDER — CETIRIZINE HCL 10 MG PO TABS: 10.0000 mg | ORAL_TABLET | Freq: Every day | ORAL | 1 refills | Status: AC

## 2024-08-17 NOTE — Progress Notes (Signed)
 Margaret Holmes , Feb 19, 1941, 83 y.o., female MRN: [id number] Patient Care Team    Relationship Specialty Notifications Start End  Catherine Charlies LABOR, DO PCP - General Family Medicine  04/09/16   Luis Purchase, MD Consulting Physician Gastroenterology  05/23/16   Jannis Kate Norris, MD Consulting Physician Obstetrics and Gynecology  05/23/16   Tanda Norleen HERO, MD Referring Physician Urology  08/27/16   [facility]  09/01/18     Chief Complaint  Patient presents with   Annual Exam    Pt is fasting.  Chronic condition management Influenza vaccine     Subjective:Margaret Holmes is a 83 y.o. female  Pt presents for an annual physical and combined OV for Chronic Conditions/illness Management  Health maintenance:  Immunizations: Tdap UTD 09/2022, influenza-given today, pneumonia series completed, Shingrix series completed Infectious disease screening: Hepatitis C screening completed DEXA: Completed 2017-normal at that time.  Pt declined future bone density tests  Hypothyroid: She reports compliance with levothyroxine  75 mcg daily daily (did not decrease)  Urinary frequency: Patient reports she has urinary frequency without dysuria.  She states she has had her bladder stretched in the past due to small bladder.  She reports she is wondering if there is a medication to help her with her overactive bladder.      08/17/2024   10:36 AM 08/12/2023    7:50 AM 03/01/2023   10:58 AM 10/17/2022    1:03 PM 05/23/2022    2:42 PM  Depression screen PHQ 2/9  Decreased Interest 0 0 0 0 0  Down, Depressed, Hopeless 0 0 0 0 0  PHQ - 2 Score 0 0 0 0 0  Altered sleeping 0      Tired, decreased energy 0      Change in appetite 0      Feeling bad or failure about yourself  0      Trouble concentrating 0      Moving slowly or fidgety/restless 0      Suicidal thoughts 0      PHQ-9 Score 0      Difficult doing work/chores Not difficult at all           05/23/2022    2:44 PM 10/17/2022    1:04 PM 03/01/2023   10:57 AM 08/12/2023    7:50 AM 08/17/2024   10:36 AM  Fall Risk  Falls in the past year? 0 0 0 0 0  Was there an injury with Fall? 0 0 0 0 0  Fall Risk Category Calculator 0 0 0 0 0  Fall Risk Category (Retired) Low  Low      (RETIRED) Patient Fall Risk Level  Low fall risk      Patient at Risk for Falls Due to Impaired vision No Fall Risks  No Fall Risks   Fall risk Follow up Falls prevention discussed  Falls evaluation completed  Falls evaluation completed Falls evaluation completed Falls evaluation completed     Data saved with a previous flowsheet row definition    Allergies  Allergen Reactions   Diazepam Other (See Comments)    Psychiatric reaction Caused forgetfulness   Codeine Other (See Comments)    Not specified in old records.   Darvocet [Propoxyphene N-Acetaminophen] Other (See Comments)    Not specified in old records   Hydrocodone Other (See Comments)    Not specified in old records   Social History   Social History Narrative  Married, 3 children, 6 grandchildren, 1 GGc.   Orig from this region.  Drives a school bus (X 31 yrs).   No T/A/Ds.   Caffeine: one cup coffee a day, 3 glasses of sweet tea per day.   No formal exercise.  She is active (NOT sedentary).            Past Medical History:  Diagnosis Date   Adnexal cyst 07/2013   Left-3cm-noted on CT abd/pelv--f/u pelvic u/s did not show anything (L ovary not visualized, likely secondary to bowel gas)-repeat pelvic u/s after 07/2014.   Anemia    Asthma    DDD (degenerative disc disease), cervical    H/O angular cheilitis    Zinc normal, vit B12 low normal (2012)   H/O vitamin D  deficiency 2011   Came back to normal range with replacement therapydd   History of adenomatous polyp of colon 2003, 2006, 2013;2016   Repeat 2013 showed tubular adenoma x 3, with no high grade dysplasia.  2016 no polyps-recall 5 yrs   History of hiatal hernia     Hyperlipidemia    per pt report 05/09/12; also on labs 06/2015.  Intolerant of pravachol  02/2016   Hypothyroidism    Kidney infection    Lower leg pain    Bilateral, crampy--ABIs/dopplers NORMAL 04/2013.   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  +internal hem.  Normal ileoscopy.   DILATATION & CURETTAGE/HYSTEROSCOPY WITH MYOSURE N/A 05/28/2016   Procedure: DILATATION & CURETTAGE/HYSTEROSCOPY WITH MYOSURE with ULTRASOUND guidance;  Surgeon: Kate Hargis Nearing, MD;  Location: [facility];  Service: Gynecology;  Laterality: N/A;  Please have ultrasound in room. Follow Silva's first case.   GANGLION CYST EXCISION  2003   Right wrist, with excision of some triquetrum spurs   stent placed right kidney     THYROIDECTOMY, PARTIAL  1972   TONSILLECTOMY AND ADENOIDECTOMY  age 85   TUBAL LIGATION     WRIST SURGERY  2006   For left thumb carpo-metacarpal arthritis   Family History  Problem Relation Age of Onset   Cancer Mother        ovarian and throat cancer   Diabetes Mother    Alcohol abuse Father        alcoholism.  Died age 61   Cancer Maternal Grandmother        breast cancer   Allergies as of 08/17/2024       Reactions   Diazepam Other (See Comments)   Psychiatric reaction Caused forgetfulness   Codeine Other (See Comments)   Not specified in old records.   Darvocet [propoxyphene N-acetaminophen] Other (See Comments)   Not specified in old records   Hydrocodone Other (See Comments)   Not specified in old records        Medication List        Accurate as of August 17, 2024 12:21 PM. If you have any questions, ask your nurse or doctor.          STOP taking these medications    diclofenac  sodium 1 %  Gel Commonly known as: VOLTAREN  Stopped by: Charlies Bellini       TAKE these medications    cetirizine  10 MG tablet Commonly known as: ZYRTEC  Take 1 tablet (10 mg total) by mouth at bedtime. What changed: when to take this Changed by: Holly Pring   cyanocobalamin  1000 MCG tablet Commonly known as: VITAMIN B12 Take 1 tablet (1,000 mcg total) by mouth daily.   fluticasone  50 MCG/ACT nasal spray Commonly known as: FLONASE  Place 1 spray into both nostrils daily.   levothyroxine  75 MCG tablet Commonly known as: Synthroid  1 tab p.o. on empty stomach 6 days a week, half tablet p.o. 1 day a week   Olopatadine  HCl 0.2 % Soln Apply 1 drop to eye daily. Each eye   triamcinolone  cream 0.1 % Commonly known as: KENALOG  Apply 1 Application topically 2 (two) times daily.   VITAMIN C-VITAMIN D -ZINC PO Take by mouth daily.        All past medical history, surgical history, allergies, family history, immunizations andmedications were updated in the EMR today and reviewed under the history and medication portions of their EMR.     ROS Negative, with the exception of above mentioned in HPI   Objective:  BP 110/74   Pulse 62   Temp 97.9 F (36.6 C)   Ht 5' 2 (1.575 m)   Wt 110 lb 9.6 oz (50.2 kg)   SpO2 95%   BMI 20.23 kg/m  Body mass index is 20.23 kg/m. Physical Exam Vitals and nursing note reviewed.  Constitutional:      General: She is not in acute distress.    Appearance: Normal appearance. She is not ill-appearing or toxic-appearing.  HENT:     Head: Normocephalic and atraumatic.     Right Ear: Tympanic membrane, ear canal and external ear normal. There is no impacted cerumen.     Left Ear: Tympanic membrane, ear canal and external ear normal. There is no impacted cerumen.     Nose: No congestion or rhinorrhea.      Mouth/Throat:     Mouth: Mucous membranes are moist.     Pharynx: Oropharynx is clear. No oropharyngeal exudate or posterior oropharyngeal erythema.  Eyes:     General: No scleral icterus.       Right eye: No discharge.        Left eye: No discharge.     Extraocular Movements: Extraocular movements intact.     Conjunctiva/sclera: Conjunctivae normal.     Pupils: Pupils are equal, round, and reactive to light.  Cardiovascular:     Rate and Rhythm: Normal rate and regular rhythm.     Pulses: Normal pulses.     Heart sounds: Normal heart sounds. No murmur heard.    No friction rub. No gallop.  Pulmonary:     Effort: Pulmonary effort is normal. No respiratory distress.     Breath sounds: Normal breath sounds. No stridor. No wheezing, rhonchi or rales.  Chest:     Chest wall: No tenderness.  Abdominal:     General: Abdomen is flat. Bowel sounds are normal. There is no distension.     Palpations: Abdomen is soft. There is no mass.     Tenderness: There is no abdominal tenderness. There is no right CVA tenderness, left CVA tenderness, guarding or rebound.     Hernia: No hernia is present.  Musculoskeletal:        General: No swelling, tenderness or deformity. Normal range of motion.  Cervical back: Normal range of motion and neck supple. No rigidity or tenderness.     Right lower leg: No edema.     Left lower leg: No edema.  Lymphadenopathy:     Cervical: No cervical adenopathy.  Skin:    General: Skin is warm and dry.     Coloration: Skin is not jaundiced or pale.     Findings: No bruising, erythema, lesion or rash.  Neurological:     General: No focal deficit present.     Mental Status: She is alert and oriented to person, place, and time. Mental status is at baseline.     Cranial Nerves: No cranial nerve deficit.     Sensory: No sensory deficit.     Motor: No weakness.     Coordination: Coordination normal.     Gait: Gait normal.     Deep Tendon Reflexes: Reflexes normal.   Psychiatric:        Mood and Affect: Mood normal.        Behavior: Behavior normal.        Thought Content: Thought content normal.        Judgment: Judgment normal.      No results found. No results found. No results found for this or any previous visit (from the past 24 hours).  Assessment/Plan: TISA WEISEL is a 83 y.o. female present for OV for  CPE and Chronic Conditions/illness Management Hypothyroidism Continue levothyroxine  to 75 mcg daily x 6 days a week and a half refill on Sunday.  Appropriate dose based on lab result today TSH and T4 free collected today Mixed hyperlipidemia Diet controlled Lipids collected today Allergies: Stable Continue zyrtec  in the evening.  Vitamin D  deficiency - VITAMIN D  25 collected today - Continue supplementation) Stage 3a chronic kidney disease (HCC) Reviewed recent BMP 3 months ago.  Stable Diabetes mellitus screening A1c collected today  OAB Urinalysis with reflex culture collected today We discussed if it is urinary frequency due to a very small bladder medication would not be helpful and we would need urology referral. She would like to try medication to help with overactive bladder to see if it would be helpful. Will wait on lab results and call in medication once results received.  Routine general medical examination at a health care facility Immunizations: Tdap UTD 09/2022, influenza-given today, pneumonia series completed, Shingrix series completed Infectious disease screening: Hepatitis C screening completed DEXA: Completed 2017-normal at that time.  Pt declined future bone density tests Patient was encouraged to exercise greater than 150 minutes a week. Patient was encouraged to choose a diet filled with fresh fruits and vegetables, and lean meats. AVS provided to patient today for education/recommendation on gender specific health and safety maintenance.  Influenza vaccine needed - Flu vaccine HIGH DOSE PF(Fluzone  Trivalent)    Return in about 1 year (around 08/18/2025) for cpe (20 min), Routine chronic condition follow-up.   Reviewed expectations re: course of current medical issues. Discussed self-management of symptoms. Outlined signs and symptoms indicating need for more acute intervention. Patient verbalized understanding and all questions were answered. Patient received an After-Visit Summary.    Orders Placed This Encounter  Procedures   Flu vaccine HIGH DOSE PF(Fluzone Trivalent)   Lipid panel   Vitamin D  (25 hydroxy)   TSH   Hemoglobin A1c   T4, free   Urinalysis w microscopic + reflex cultur   Meds ordered this encounter  Medications   cetirizine  (ZYRTEC ) 10 MG tablet  Sig: Take 1 tablet (10 mg total) by mouth at bedtime.    Dispense:  90 tablet    Refill:  1   Referral Orders  No referral(s) requested today     Note is dictated utilizing voice recognition software. Although note has been proof read prior to signing, occasional typographical errors still can be missed. If any questions arise, please do not hesitate to call for verification.   electronically signed by:  Charlies Bellini, DO  [facility] - [place]

## 2024-08-17 NOTE — Patient Instructions (Addendum)

## 2024-08-18 ENCOUNTER — Ambulatory Visit: Payer: Self-pay | Admitting: Family Medicine

## 2024-08-18 DIAGNOSIS — N3281 Overactive bladder: Secondary | ICD-10-CM

## 2024-08-18 MED ORDER — TOLTERODINE TARTRATE ER 2 MG PO CP24: 2.0000 mg | ORAL_CAPSULE | Freq: Every day | ORAL | 5 refills | Status: AC

## 2024-08-18 MED ORDER — LEVOTHYROXINE SODIUM 75 MCG PO TABS: ORAL_TABLET | ORAL | 3 refills | Status: AC

## 2024-08-20 LAB — CULTURE INDICATED

## 2024-08-20 LAB — URINALYSIS W MICROSCOPIC + REFLEX CULTURE
Bacteria, UA: NONE SEEN /HPF
Bilirubin Urine: NEGATIVE
Glucose, UA: NEGATIVE
Hgb urine dipstick: NEGATIVE
Hyaline Cast: NONE SEEN /LPF
Ketones, ur: NEGATIVE
Nitrites, Initial: NEGATIVE
Protein, ur: NEGATIVE
RBC / HPF: NONE SEEN /HPF (ref 0–2)
Specific Gravity, Urine: 1.011 (ref 1.001–1.035)
pH: 5.5 (ref 5.0–8.0)

## 2024-08-20 LAB — URINE CULTURE
MICRO NUMBER:: 17126274
SPECIMEN QUALITY:: ADEQUATE

## 2024-08-21 MED ORDER — CEFDINIR 300 MG PO CAPS: 300.0000 mg | ORAL_CAPSULE | Freq: Two times a day (BID) | ORAL | 0 refills | Status: AC

## 2024-08-21 NOTE — Telephone Encounter (Signed)
 Please call patient and inform her her urine culture showed evidence of a E. coli UTI. We will need to start antibiotics for her. I have called in Omnicef  for her to take every 12 hours for 7 days

## 2024-08-21 NOTE — Progress Notes (Signed)
 Results given to pt and pt advised of meds sent to the pharmacy.

## 2024-08-28 DIAGNOSIS — E785 Hyperlipidemia, unspecified: Secondary | ICD-10-CM | POA: Diagnosis not present

## 2024-08-28 DIAGNOSIS — N1831 Chronic kidney disease, stage 3a: Secondary | ICD-10-CM | POA: Diagnosis not present

## 2024-08-28 DIAGNOSIS — J309 Allergic rhinitis, unspecified: Secondary | ICD-10-CM | POA: Diagnosis not present

## 2024-08-28 DIAGNOSIS — L309 Dermatitis, unspecified: Secondary | ICD-10-CM | POA: Diagnosis not present

## 2024-08-28 DIAGNOSIS — E039 Hypothyroidism, unspecified: Secondary | ICD-10-CM | POA: Diagnosis not present

## 2024-08-28 DIAGNOSIS — M1990 Unspecified osteoarthritis, unspecified site: Secondary | ICD-10-CM | POA: Diagnosis not present

## 2024-08-28 DIAGNOSIS — N3941 Urge incontinence: Secondary | ICD-10-CM | POA: Diagnosis not present

## 2024-08-28 DIAGNOSIS — K056 Periodontal disease, unspecified: Secondary | ICD-10-CM | POA: Diagnosis not present

## 2024-08-28 DIAGNOSIS — L03119 Cellulitis of unspecified part of limb: Secondary | ICD-10-CM | POA: Diagnosis not present

## 2024-09-01 ENCOUNTER — Ambulatory Visit: Payer: Self-pay

## 2024-09-01 NOTE — Telephone Encounter (Signed)
 FYI Only or Action Required?: Action required by provider: clinical question for provider. See protocol additional info bullet point #2 for question  Patient was last seen in primary care on 08/17/2024 by Catherine, [name] A, DO.  Called Nurse Triage reporting Rash.  Symptoms began about a month ago.  Interventions attempted: Prescription medications: zyrtec , triamcinolone , cefdinir  and Rest, hydration, or home remedies.  Symptoms are: unchanged.  Triage Disposition: See PCP When Office is Open (Within 3 Days)  Patient/caregiver understands and will follow disposition?: Unsure    Copied from CRM #[id number]. Topic: Clinical - Medication Question >> Sep 01, 2024  9:10 AM Suzen RAMAN wrote: Reason for CRM: Patient would like to know if she should continue taken/be re-prescribed CEFDINIR  300 MG because the concern she had on her arm has not cleared up after completion of the 7 days regimen.   [phone] (M) Reason for Disposition  [1] After 14 days AND [2] insect bite isn't healed  Answer Assessment - Initial Assessment Questions Additional info:  1) Patient attended urgent care on 07/30/24 for insect bites that looked infected, she was rx Zyrtec  X 2 weeks and Triamcinolone  which she has completed. She had an office visit on 08/18/24 diagnosed with ecoli uti and has completed Cefdinir  and using tolterodine. She called today to say that the new antibiotics did not work on her arm infection from bug bites and requesting another round of antibiotics. Explained to patient that cefdinir  was prescribed for uti, patient states she did not have sx of uti except urinary frequency which is baseline for her. No complaints of urinary symptoms today.  2) Advised acute appointment for pcp to evaluate non healing excoriation to arm. Patient accepted next available acute visit on 09/02/24 but asks that PCP review triage note to determine if appointment is needed or if pcp will call in more abx and cancel acute  visit. Please advise.     1. TYPE of INSECT: What type of insect was it?      unknown 2. ONSET: When did you get bitten?      Mid September.  3. LOCATION: Where is the insect bite located?      Multiple-Left upper arm shoulder to elbow 4. REDNESS: Is the area red or pink? If Yes, ask: What size is the area of redness? (inches or cm). When did the redness start?     varies 5. PAIN: Is there any pain? If Yes, ask: How bad is the pain? (Scale 0-10; or none, mild, moderate, severe)     no 6. ITCHING: Does it itch? If Yes, ask: How bad is the itch?      intense 7. SWELLING: How big is the swelling? (e.g., inches, cm, or compare to coins)     Denies 8. OTHER SYMPTOMS: Do you have any other symptoms?  (e.g., difficulty breathing, fever, hives)     Urinary frequency  Protocols used: Insect Bite-A-AH

## 2024-09-01 NOTE — Telephone Encounter (Signed)
 Duplicate question---please see Triage Encounter from today  Reason for Disposition . [1] Follow-up call to recent contact AND [2] information only call, no triage required  Answer Assessment - Initial Assessment Questions Duplicate question about unresolved rash---please see Triage Encounter from today  Protocols used: Information Only Call - No Triage-A-AH

## 2024-09-02 ENCOUNTER — Ambulatory Visit: Admitting: Family Medicine

## 2024-09-03 ENCOUNTER — Telehealth: Payer: Self-pay

## 2024-09-03 NOTE — Telephone Encounter (Signed)
 Pt came to office to discuss bill, states that she tried to pay copay and was given her money back for 10/20 appt. Pt had CPE and CMC completed together.

## 2024-09-07 NOTE — Telephone Encounter (Signed)
 FYI-Advised pt that the $20 bill that she received is because the appt was billed for CPE and chronic care management. Pt states she will come some time tomorrow to pay bill.

## 2024-09-08 ENCOUNTER — Telehealth: Payer: Self-pay

## 2024-09-08 MED ORDER — FLUCONAZOLE 150 MG PO TABS: 150.0000 mg | ORAL_TABLET | Freq: Every day | ORAL | 0 refills | Status: AC

## 2024-09-08 NOTE — Telephone Encounter (Signed)
 LM for pt to return call to discuss.

## 2024-09-08 NOTE — Telephone Encounter (Signed)
 Patient came to office to pay balance on acct. While here she asked if I could send Dr. Catherine a message requesting something for yeast inf. Patient was seen by Dr. Catherine treated for UTI and was prescribed antibiotic. Patient thinks that she has yeast infection from taking antibiotic.  Please advise. [facility] uses [facility]

## 2024-09-08 NOTE — Telephone Encounter (Signed)
 Diflucan  x 1 prescribed

## 2024-11-04 ENCOUNTER — Ambulatory Visit: Payer: Self-pay

## 2024-11-04 NOTE — Telephone Encounter (Signed)
 FYI Only or Action Required?: Action required by provider: request for appointment, clinical question for provider, and update on patient condition.  Patient was last seen in primary care on 08/17/2024 by Catherine Fuller A, DO.  Called Nurse Triage reporting Insect Bite.  Symptoms began several weeks ago.  Interventions attempted: OTC medications: anti-histamines.  Symptoms are: unchanged.  Triage Disposition: Home Care  Patient/caregiver understands and will follow disposition?: No, wishes to speak with PCP   Copied from CRM #[id number]. Topic: Clinical - Red Word Triage >> Nov 04, 2024  2:26 PM Roselie BROCKS wrote: Red Word that prompted transfer to Nurse Triage: Patient states she is having severe joint pain,very painful headaches,and chills.   Reason for Disposition  Itchy insect bite  Answer Assessment - Initial Assessment Questions Pt called in stating she has severe h/a, joint pain and chills r/t parasite infection. Pt states she was tx for insect bite by [facility] last fall and saw PCP but she is calling in requesting appt for full parasite work up. Pt states she always feels like something is crawling on her. She reports bite on her L lower ear lobe. 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 Pt jumping around in conversation mentioning different symptoms and not answering NT questions but always comes back to parasites and wanting work up. Discussed appt with PCP at soonest available appt 01/12 but pt states she needs to be seen right now and will go to ED. Pt states she needs a really strong abx and requested PCP to send rx without appt. Discussed abx would not tx a parasite and pt stated she did not care and to send her the strongest medication possible.  Protocols used: Insect Bite-A-AH

## 2024-11-04 NOTE — Telephone Encounter (Signed)
 Pt offered earliest appt with PCP, declined stating she needed to be seen now. Going to ED.
